# Patient Record
Sex: Female | Born: 1950 | Race: White | Hispanic: No | Marital: Married | Smoking: Former smoker
Health system: Southern US, Community
[De-identification: ages and names within clinical notes are randomized; demographics above are authoritative.]

## PROBLEM LIST (undated history)

## (undated) DIAGNOSIS — I10 Essential (primary) hypertension: Secondary | ICD-10-CM

## (undated) DIAGNOSIS — E785 Hyperlipidemia, unspecified: Secondary | ICD-10-CM

## (undated) DIAGNOSIS — I499 Cardiac arrhythmia, unspecified: Secondary | ICD-10-CM

## (undated) DIAGNOSIS — I219 Acute myocardial infarction, unspecified: Secondary | ICD-10-CM

## (undated) DIAGNOSIS — M1990 Unspecified osteoarthritis, unspecified site: Secondary | ICD-10-CM

## (undated) DIAGNOSIS — F419 Anxiety disorder, unspecified: Secondary | ICD-10-CM

## (undated) DIAGNOSIS — K5900 Constipation, unspecified: Secondary | ICD-10-CM

## (undated) DIAGNOSIS — R51 Headache: Secondary | ICD-10-CM

## (undated) DIAGNOSIS — R519 Headache, unspecified: Secondary | ICD-10-CM

## (undated) DIAGNOSIS — L409 Psoriasis, unspecified: Secondary | ICD-10-CM

## (undated) DIAGNOSIS — F329 Major depressive disorder, single episode, unspecified: Secondary | ICD-10-CM

## (undated) DIAGNOSIS — I2510 Atherosclerotic heart disease of native coronary artery without angina pectoris: Secondary | ICD-10-CM

## (undated) DIAGNOSIS — F32A Depression, unspecified: Secondary | ICD-10-CM

## (undated) HISTORY — PX: WISDOM TOOTH EXTRACTION: SHX21

## (undated) HISTORY — DX: Hyperlipidemia, unspecified: E78.5

## (undated) HISTORY — PX: CORONARY ANGIOPLASTY: SHX604

## (undated) HISTORY — PX: DILATION AND CURETTAGE OF UTERUS: SHX78

## (undated) HISTORY — PX: COLONOSCOPY: SHX174

## (undated) HISTORY — PX: TONSILLECTOMY: SUR1361

## (undated) HISTORY — DX: Constipation, unspecified: K59.00

## (undated) HISTORY — PX: TOOTH EXTRACTION: SHX859

## (undated) HISTORY — DX: Cardiac arrhythmia, unspecified: I49.9

## (undated) HISTORY — PX: CORONARY STENT PLACEMENT: SHX1402

---

## 1983-09-24 HISTORY — PX: NASAL SEPTUM SURGERY: SHX37

## 2000-07-31 ENCOUNTER — Ambulatory Visit (HOSPITAL_COMMUNITY): Admission: RE | Admit: 2000-07-31 | Discharge: 2000-07-31 | Payer: Self-pay | Admitting: Family Medicine

## 2000-07-31 ENCOUNTER — Encounter: Payer: Self-pay | Admitting: Family Medicine

## 2000-08-13 ENCOUNTER — Ambulatory Visit (HOSPITAL_COMMUNITY): Admission: RE | Admit: 2000-08-13 | Discharge: 2000-08-13 | Payer: Self-pay | Admitting: Family Medicine

## 2000-08-13 ENCOUNTER — Encounter: Payer: Self-pay | Admitting: Family Medicine

## 2001-09-29 ENCOUNTER — Other Ambulatory Visit: Admission: RE | Admit: 2001-09-29 | Discharge: 2001-09-29 | Payer: Self-pay | Admitting: Family Medicine

## 2001-12-04 ENCOUNTER — Encounter: Payer: Self-pay | Admitting: Family Medicine

## 2001-12-04 ENCOUNTER — Ambulatory Visit (HOSPITAL_COMMUNITY): Admission: RE | Admit: 2001-12-04 | Discharge: 2001-12-04 | Payer: Self-pay | Admitting: Family Medicine

## 2001-12-18 ENCOUNTER — Encounter: Admission: RE | Admit: 2001-12-18 | Discharge: 2001-12-18 | Payer: Self-pay

## 2003-06-20 ENCOUNTER — Encounter: Admission: RE | Admit: 2003-06-20 | Discharge: 2003-06-20 | Payer: Self-pay | Admitting: Internal Medicine

## 2003-06-20 ENCOUNTER — Encounter: Payer: Self-pay | Admitting: Internal Medicine

## 2003-09-24 HISTORY — PX: APPENDECTOMY: SHX54

## 2004-04-23 ENCOUNTER — Other Ambulatory Visit: Admission: RE | Admit: 2004-04-23 | Discharge: 2004-04-23 | Payer: Self-pay | Admitting: Obstetrics and Gynecology

## 2005-12-04 ENCOUNTER — Encounter: Admission: RE | Admit: 2005-12-04 | Discharge: 2005-12-04 | Payer: Self-pay | Admitting: Obstetrics and Gynecology

## 2006-06-11 ENCOUNTER — Ambulatory Visit: Payer: Self-pay | Admitting: Gastroenterology

## 2006-06-27 ENCOUNTER — Ambulatory Visit: Payer: Self-pay | Admitting: Gastroenterology

## 2006-09-23 HISTORY — PX: SHOULDER ARTHROSCOPY: SHX128

## 2007-03-04 ENCOUNTER — Encounter: Admission: RE | Admit: 2007-03-04 | Discharge: 2007-03-04 | Payer: Self-pay | Admitting: Internal Medicine

## 2007-11-26 ENCOUNTER — Encounter: Admission: RE | Admit: 2007-11-26 | Discharge: 2007-11-26 | Payer: Self-pay | Admitting: Orthopaedic Surgery

## 2009-06-08 ENCOUNTER — Encounter: Admission: RE | Admit: 2009-06-08 | Discharge: 2009-06-08 | Payer: Self-pay | Admitting: Internal Medicine

## 2010-02-22 ENCOUNTER — Encounter: Admission: RE | Admit: 2010-02-22 | Discharge: 2010-02-22 | Payer: Self-pay | Admitting: Internal Medicine

## 2010-04-26 DIAGNOSIS — I10 Essential (primary) hypertension: Secondary | ICD-10-CM

## 2010-04-26 HISTORY — DX: Essential (primary) hypertension: I10

## 2010-05-10 DIAGNOSIS — M48061 Spinal stenosis, lumbar region without neurogenic claudication: Secondary | ICD-10-CM | POA: Insufficient documentation

## 2010-05-10 HISTORY — DX: Spinal stenosis, lumbar region without neurogenic claudication: M48.061

## 2010-10-14 ENCOUNTER — Encounter: Payer: Self-pay | Admitting: Orthopaedic Surgery

## 2012-09-23 DIAGNOSIS — I219 Acute myocardial infarction, unspecified: Secondary | ICD-10-CM | POA: Insufficient documentation

## 2012-09-23 HISTORY — PX: CARDIAC CATHETERIZATION: SHX172

## 2012-09-23 HISTORY — DX: Acute myocardial infarction, unspecified: I21.9

## 2014-09-29 ENCOUNTER — Other Ambulatory Visit: Payer: Self-pay | Admitting: Internal Medicine

## 2014-09-29 DIAGNOSIS — M545 Low back pain: Secondary | ICD-10-CM

## 2014-09-30 ENCOUNTER — Ambulatory Visit
Admission: RE | Admit: 2014-09-30 | Discharge: 2014-09-30 | Disposition: A | Discharge status: Home / Self Care | Payer: BLUE CROSS/BLUE SHIELD | Source: Ambulatory Visit | Attending: Internal Medicine | Admitting: Internal Medicine

## 2014-09-30 DIAGNOSIS — M545 Low back pain: Secondary | ICD-10-CM

## 2014-09-30 MED ORDER — GADOBENATE DIMEGLUMINE 529 MG/ML IV SOLN
14.0000 mL | Freq: Once | INTRAVENOUS | Status: AC | PRN
  Administered 2014-09-30: 14 mL via INTRAVENOUS

## 2014-10-27 DIAGNOSIS — M899 Disorder of bone, unspecified: Secondary | ICD-10-CM

## 2014-10-27 DIAGNOSIS — M4310 Spondylolisthesis, site unspecified: Secondary | ICD-10-CM | POA: Insufficient documentation

## 2014-10-27 HISTORY — DX: Disorder of bone, unspecified: M89.9

## 2014-10-27 HISTORY — DX: Spondylolisthesis, site unspecified: M43.10

## 2015-02-10 ENCOUNTER — Other Ambulatory Visit: Payer: Self-pay | Admitting: Neurosurgery

## 2015-03-01 DIAGNOSIS — I700 Atherosclerosis of aorta: Secondary | ICD-10-CM

## 2015-03-01 DIAGNOSIS — I2510 Atherosclerotic heart disease of native coronary artery without angina pectoris: Secondary | ICD-10-CM | POA: Insufficient documentation

## 2015-03-01 DIAGNOSIS — E785 Hyperlipidemia, unspecified: Secondary | ICD-10-CM | POA: Insufficient documentation

## 2015-03-01 DIAGNOSIS — E7800 Pure hypercholesterolemia, unspecified: Secondary | ICD-10-CM | POA: Insufficient documentation

## 2015-03-01 HISTORY — DX: Atherosclerotic heart disease of native coronary artery without angina pectoris: I25.10

## 2015-03-01 HISTORY — DX: Atherosclerosis of aorta: I70.0

## 2015-03-07 HISTORY — PX: CARDIOVASCULAR STRESS TEST: SHX262

## 2015-03-10 ENCOUNTER — Encounter (HOSPITAL_COMMUNITY)
Admission: RE | Admit: 2015-03-10 | Discharge: 2015-03-10 | Disposition: A | Discharge status: Home / Self Care | Payer: BLUE CROSS/BLUE SHIELD | Source: Ambulatory Visit | Attending: Neurosurgery | Admitting: Neurosurgery

## 2015-03-10 ENCOUNTER — Encounter (HOSPITAL_COMMUNITY): Payer: Self-pay

## 2015-03-10 DIAGNOSIS — Z87891 Personal history of nicotine dependence: Secondary | ICD-10-CM | POA: Diagnosis not present

## 2015-03-10 DIAGNOSIS — I2510 Atherosclerotic heart disease of native coronary artery without angina pectoris: Secondary | ICD-10-CM | POA: Diagnosis not present

## 2015-03-10 DIAGNOSIS — Z01812 Encounter for preprocedural laboratory examination: Secondary | ICD-10-CM | POA: Insufficient documentation

## 2015-03-10 DIAGNOSIS — I252 Old myocardial infarction: Secondary | ICD-10-CM | POA: Diagnosis not present

## 2015-03-10 DIAGNOSIS — Z79899 Other long term (current) drug therapy: Secondary | ICD-10-CM | POA: Insufficient documentation

## 2015-03-10 DIAGNOSIS — I10 Essential (primary) hypertension: Secondary | ICD-10-CM | POA: Diagnosis not present

## 2015-03-10 DIAGNOSIS — Z0183 Encounter for blood typing: Secondary | ICD-10-CM | POA: Insufficient documentation

## 2015-03-10 DIAGNOSIS — Z7982 Long term (current) use of aspirin: Secondary | ICD-10-CM | POA: Insufficient documentation

## 2015-03-10 DIAGNOSIS — Z01818 Encounter for other preprocedural examination: Secondary | ICD-10-CM | POA: Insufficient documentation

## 2015-03-10 HISTORY — DX: Anxiety disorder, unspecified: F41.9

## 2015-03-10 HISTORY — DX: Essential (primary) hypertension: I10

## 2015-03-10 HISTORY — DX: Atherosclerotic heart disease of native coronary artery without angina pectoris: I25.10

## 2015-03-10 HISTORY — DX: Major depressive disorder, single episode, unspecified: F32.9

## 2015-03-10 HISTORY — DX: Acute myocardial infarction, unspecified: I21.9

## 2015-03-10 HISTORY — DX: Depression, unspecified: F32.A

## 2015-03-10 LAB — CBC WITH DIFFERENTIAL/PLATELET
BASOS ABS: 0 10*3/uL (ref 0.0–0.1)
BASOS PCT: 1 % (ref 0–1)
EOS ABS: 0.4 10*3/uL (ref 0.0–0.7)
Eosinophils Relative: 5 % (ref 0–5)
HCT: 40.6 % (ref 36.0–46.0)
Hemoglobin: 14.2 g/dL (ref 12.0–15.0)
LYMPHS ABS: 2.4 10*3/uL (ref 0.7–4.0)
Lymphocytes Relative: 35 % (ref 12–46)
MCH: 31.9 pg (ref 26.0–34.0)
MCHC: 35 g/dL (ref 30.0–36.0)
MCV: 91.2 fL (ref 78.0–100.0)
Monocytes Absolute: 0.5 10*3/uL (ref 0.1–1.0)
Monocytes Relative: 7 % (ref 3–12)
NEUTROS PCT: 52 % (ref 43–77)
Neutro Abs: 3.6 10*3/uL (ref 1.7–7.7)
PLATELETS: 233 10*3/uL (ref 150–400)
RBC: 4.45 MIL/uL (ref 3.87–5.11)
RDW: 12.2 % (ref 11.5–15.5)
WBC: 6.9 10*3/uL (ref 4.0–10.5)

## 2015-03-10 LAB — BASIC METABOLIC PANEL
ANION GAP: 9 (ref 5–15)
BUN: 18 mg/dL (ref 6–20)
CALCIUM: 9.7 mg/dL (ref 8.9–10.3)
CO2: 27 mmol/L (ref 22–32)
CREATININE: 0.84 mg/dL (ref 0.44–1.00)
Chloride: 104 mmol/L (ref 101–111)
GFR calc Af Amer: >60 mL/min (ref >60)
GFR calc non Af Amer: >60 mL/min (ref >60)
GLUCOSE: 94 mg/dL (ref 65–99)
Potassium: 4.2 mmol/L (ref 3.5–5.1)
SODIUM: 140 mmol/L (ref 135–145)

## 2015-03-10 LAB — SURGICAL PCR SCREEN
MRSA, PCR: NEGATIVE
STAPHYLOCOCCUS AUREUS: NEGATIVE

## 2015-03-10 LAB — TYPE AND SCREEN
ABO/RH(D): O POS
ANTIBODY SCREEN: NEGATIVE

## 2015-03-10 LAB — ABO/RH: ABO/RH(D): O POS

## 2015-03-10 NOTE — Progress Notes (Signed)
req'd ekg, stress test (done 03/07/15) [place] [facility] [place] dr Shirlee More [phone]

## 2015-03-10 NOTE — Pre-Procedure Instructions (Signed)
Carol Love  03/10/2015      [id number] - [place], [place] - [address] [facility] [address] Phone: [phone] Fax: [phone]    Your procedure is scheduled on 03/20/15.  Report to [facility] short stay admitting at 600 A.M.  Call this number if you have problems the morning of surgery:  [phone]   Remember:  Do not eat food or drink liquids after midnight.  Take these medicines the morning of surgery with A SIP OF WATER sectral(acebutolol), zoloft, tramadol     STOP all herbel meds, nsaids (aleve,naproxen,advil,ibuprofen) 5 days prior to surgery starting 03/15/15 including all vitamins, aspirin,etodalac   Do not wear jewelry, make-up or nail polish.  Do not wear lotions, powders, or perfumes.  You may wear deodorant.  Do not shave 48 hours prior to surgery.  Men may shave face and neck.  Do not bring valuables to the hospital.  [facility] is not responsible for any belongings or valuables.  Contacts, dentures or bridgework may not be worn into surgery.  Leave your suitcase in the car.  After surgery it may be brought to your room.  For patients admitted to the hospital, discharge time will be determined by your treatment team.  Patients discharged the day of surgery will not be allowed to drive home.   Name and phone number of your driver:    Special instructions:   Special Instructions: [place] - Preparing for Surgery  Before surgery, you can play an important role.  Because skin is not sterile, your skin needs to be as free of germs as possible.  You can reduce the number of germs on you skin by washing with CHG (chlorahexidine gluconate) soap before surgery.  CHG is an antiseptic cleaner which kills germs and bonds with the skin to continue killing germs even after washing.  Please DO NOT use if you have an allergy to CHG or antibacterial soaps.  If your skin becomes reddened/irritated stop using the CHG and inform your nurse  when you arrive at Short Stay.  Do not shave (including legs and underarms) for at least 48 hours prior to the first CHG shower.  You may shave your face.  Please follow these instructions carefully:   1.  Shower with CHG Soap the night before surgery and the morning of Surgery.  2.  If you choose to wash your hair, wash your hair first as usual with your normal shampoo.  3.  After you shampoo, rinse your hair and body thoroughly to remove the Shampoo.  4.  Use CHG as you would any other liquid soap.  You can apply chg directly  to the skin and wash gently with scrungie or a clean washcloth.  5.  Apply the CHG Soap to your body ONLY FROM THE NECK DOWN.  Do not use on open wounds or open sores.  Avoid contact with your eyes ears, mouth and genitals (private parts).  Wash genitals (private parts)       with your normal soap.  6.  Wash thoroughly, paying special attention to the area where your surgery will be performed.  7.  Thoroughly rinse your body with warm water from the neck down.  8.  DO NOT shower/wash with your normal soap after using and rinsing off the CHG Soap.  9.  Pat yourself dry with a clean towel.            10.  Wear  clean pajamas.            11.  Place clean sheets on your bed the night of your first shower and do not sleep with pets.  Day of Surgery  Do not apply any lotions/deodorants the morning of surgery.  Please wear clean clothes to the hospital/surgery center.  Please read over the following fact sheets that you were given. Pain Booklet, Coughing and Deep Breathing, Blood Transfusion Information, MRSA Information and Surgical Site Infection Prevention

## 2015-03-10 NOTE — Progress Notes (Addendum)
Anesthesia Chart Review:  Pt is 64 year old female scheduled for maximum access PLIF on 03/20/2015 with Dr. Annette Stable.   Cardiologist is Dr. Bettina Gavia, last office visit with his partner, Dr. Agustin Cree on 03/01/2015.   PMH includes: MI (2014), CAD (stent), HTN. Former smoker. BMI 30  Medications include: acebutolol, ASA, lipitor  Preoperative labs reviewed.    Nuclear stress test 03/07/2015: -negative myocardial perfusion stress test  Pt has cardiac clearance for procedure from Dr. Agustin Cree.  There is no EKG on file at [facility]. Will need to obtain DOS.   If EKG acceptable, I anticipate pt can proceed as scheduled.   Willeen Cass, FNP-BC [facility] Short Stay Surgical Center/Anesthesiology Phone: [phone] 03/13/2015 3:13 PM

## 2015-03-20 ENCOUNTER — Inpatient Hospital Stay (HOSPITAL_COMMUNITY): Payer: BLUE CROSS/BLUE SHIELD | Admitting: Emergency Medicine

## 2015-03-20 ENCOUNTER — Encounter (HOSPITAL_COMMUNITY): Payer: Self-pay | Admitting: Certified Registered Nurse Anesthetist

## 2015-03-20 ENCOUNTER — Inpatient Hospital Stay (HOSPITAL_COMMUNITY): Payer: BLUE CROSS/BLUE SHIELD

## 2015-03-20 ENCOUNTER — Inpatient Hospital Stay (HOSPITAL_COMMUNITY): Payer: BLUE CROSS/BLUE SHIELD | Admitting: Certified Registered Nurse Anesthetist

## 2015-03-20 ENCOUNTER — Inpatient Hospital Stay (HOSPITAL_COMMUNITY)
Admission: RE | Admit: 2015-03-20 | Discharge: 2015-03-22 | DRG: 460 | Disposition: A | Discharge status: Home / Self Care | Payer: BLUE CROSS/BLUE SHIELD | Source: Ambulatory Visit | Attending: Neurosurgery | Admitting: Neurosurgery

## 2015-03-20 ENCOUNTER — Encounter (HOSPITAL_COMMUNITY)
Admission: RE | Disposition: A | Discharge status: Home / Self Care | Payer: Self-pay | Source: Ambulatory Visit | Attending: Neurosurgery

## 2015-03-20 DIAGNOSIS — F419 Anxiety disorder, unspecified: Secondary | ICD-10-CM | POA: Diagnosis present

## 2015-03-20 DIAGNOSIS — Z882 Allergy status to sulfonamides status: Secondary | ICD-10-CM | POA: Diagnosis not present

## 2015-03-20 DIAGNOSIS — I10 Essential (primary) hypertension: Secondary | ICD-10-CM | POA: Diagnosis present

## 2015-03-20 DIAGNOSIS — I252 Old myocardial infarction: Secondary | ICD-10-CM | POA: Diagnosis not present

## 2015-03-20 DIAGNOSIS — M4310 Spondylolisthesis, site unspecified: Secondary | ICD-10-CM

## 2015-03-20 DIAGNOSIS — M4806 Spinal stenosis, lumbar region: Secondary | ICD-10-CM | POA: Diagnosis present

## 2015-03-20 DIAGNOSIS — Z880 Allergy status to penicillin: Secondary | ICD-10-CM | POA: Diagnosis not present

## 2015-03-20 DIAGNOSIS — Z7982 Long term (current) use of aspirin: Secondary | ICD-10-CM

## 2015-03-20 DIAGNOSIS — Z791 Long term (current) use of non-steroidal anti-inflammatories (NSAID): Secondary | ICD-10-CM | POA: Diagnosis not present

## 2015-03-20 DIAGNOSIS — Z87891 Personal history of nicotine dependence: Secondary | ICD-10-CM | POA: Diagnosis not present

## 2015-03-20 DIAGNOSIS — Z955 Presence of coronary angioplasty implant and graft: Secondary | ICD-10-CM | POA: Diagnosis not present

## 2015-03-20 DIAGNOSIS — M4326 Fusion of spine, lumbar region: Secondary | ICD-10-CM

## 2015-03-20 DIAGNOSIS — F329 Major depressive disorder, single episode, unspecified: Secondary | ICD-10-CM | POA: Diagnosis present

## 2015-03-20 DIAGNOSIS — M4316 Spondylolisthesis, lumbar region: Principal | ICD-10-CM | POA: Diagnosis present

## 2015-03-20 DIAGNOSIS — Z886 Allergy status to analgesic agent status: Secondary | ICD-10-CM | POA: Diagnosis not present

## 2015-03-20 DIAGNOSIS — I2510 Atherosclerotic heart disease of native coronary artery without angina pectoris: Secondary | ICD-10-CM | POA: Diagnosis present

## 2015-03-20 HISTORY — PX: MAXIMUM ACCESS (MAS)POSTERIOR LUMBAR INTERBODY FUSION (PLIF) 1 LEVEL: SHX6368

## 2015-03-20 HISTORY — DX: Spondylolisthesis, site unspecified: M43.10

## 2015-03-20 SURGERY — FOR MAXIMUM ACCESS (MAS) POSTERIOR LUMBAR INTERBODY FUSION (PLIF) 1 LEVEL
Anesthesia: General | Site: Back

## 2015-03-20 MED ORDER — ONDANSETRON HCL 4 MG/2ML IJ SOLN
INTRAMUSCULAR | Status: DC | PRN
  Administered 2015-03-20: 4 mg via INTRAVENOUS

## 2015-03-20 MED ORDER — DEXAMETHASONE SODIUM PHOSPHATE 4 MG/ML IJ SOLN
INTRAMUSCULAR | Status: DC | PRN
  Administered 2015-03-20: 4 mg via INTRAVENOUS

## 2015-03-20 MED ORDER — PHENOL 1.4 % MT LIQD: 1.0000 | OROMUCOSAL | Status: DC | PRN

## 2015-03-20 MED ORDER — ROCURONIUM BROMIDE 50 MG/5ML IV SOLN
INTRAVENOUS | Status: AC
  Filled 2015-03-20: qty 1

## 2015-03-20 MED ORDER — OXYCODONE-ACETAMINOPHEN 5-325 MG PO TABS
1.0000 | ORAL_TABLET | ORAL | Status: DC | PRN
  Administered 2015-03-20 – 2015-03-22 (×10): 2 via ORAL
  Filled 2015-03-20 (×10): qty 2

## 2015-03-20 MED ORDER — SUCCINYLCHOLINE CHLORIDE 20 MG/ML IJ SOLN
INTRAMUSCULAR | Status: DC | PRN
  Administered 2015-03-20: 50 mg via INTRAVENOUS

## 2015-03-20 MED ORDER — ASPIRIN 81 MG PO CHEW
162.0000 mg | CHEWABLE_TABLET | Freq: Every evening | ORAL | Status: DC
  Administered 2015-03-20 – 2015-03-21 (×2): 162 mg via ORAL
  Filled 2015-03-20 (×4): qty 2

## 2015-03-20 MED ORDER — BACITRACIN 50000 UNITS IM SOLR
INTRAMUSCULAR | Status: DC | PRN
  Administered 2015-03-20: 500 mL

## 2015-03-20 MED ORDER — DIAZEPAM 5 MG PO TABS
5.0000 mg | ORAL_TABLET | Freq: Four times a day (QID) | ORAL | Status: DC | PRN
  Administered 2015-03-20 – 2015-03-21 (×3): 5 mg via ORAL
  Administered 2015-03-21 – 2015-03-22 (×2): 10 mg via ORAL
  Filled 2015-03-20: qty 1
  Filled 2015-03-20: qty 2
  Filled 2015-03-20: qty 1
  Filled 2015-03-20: qty 2
  Filled 2015-03-20: qty 1

## 2015-03-20 MED ORDER — OXYCODONE HCL 5 MG PO TABS
ORAL_TABLET | ORAL | Status: AC
  Filled 2015-03-20: qty 1

## 2015-03-20 MED ORDER — LACTATED RINGERS IV SOLN
INTRAVENOUS | Status: DC
  Administered 2015-03-20 (×2): via INTRAVENOUS

## 2015-03-20 MED ORDER — TRAMADOL HCL 50 MG PO TABS: 50.0000 mg | ORAL_TABLET | Freq: Two times a day (BID) | ORAL | Status: DC | PRN

## 2015-03-20 MED ORDER — DEXAMETHASONE SODIUM PHOSPHATE 10 MG/ML IJ SOLN
10.0000 mg | INTRAMUSCULAR | Status: DC
  Filled 2015-03-20: qty 1

## 2015-03-20 MED ORDER — VANCOMYCIN HCL 1000 MG IV SOLR: 1000.0000 mg | INTRAVENOUS | Status: DC | PRN

## 2015-03-20 MED ORDER — SODIUM CHLORIDE 0.9 % IJ SOLN
INTRAMUSCULAR | Status: AC
  Filled 2015-03-20: qty 10

## 2015-03-20 MED ORDER — DEXAMETHASONE SODIUM PHOSPHATE 4 MG/ML IJ SOLN
INTRAMUSCULAR | Status: AC
  Filled 2015-03-20: qty 1

## 2015-03-20 MED ORDER — MIDAZOLAM HCL 5 MG/5ML IJ SOLN
INTRAMUSCULAR | Status: DC | PRN
  Administered 2015-03-20: 2 mg via INTRAVENOUS

## 2015-03-20 MED ORDER — PROPOFOL INFUSION 10 MG/ML OPTIME
INTRAVENOUS | Status: DC | PRN
  Administered 2015-03-20: 100 ug/kg/min via INTRAVENOUS

## 2015-03-20 MED ORDER — HYDROCODONE-ACETAMINOPHEN 5-325 MG PO TABS: 1.0000 | ORAL_TABLET | ORAL | Status: DC | PRN

## 2015-03-20 MED ORDER — HYDROMORPHONE HCL 1 MG/ML IJ SOLN
INTRAMUSCULAR | Status: AC
  Filled 2015-03-20: qty 1

## 2015-03-20 MED ORDER — LIDOCAINE HCL (CARDIAC) 20 MG/ML IV SOLN
INTRAVENOUS | Status: AC
  Filled 2015-03-20: qty 5

## 2015-03-20 MED ORDER — LIDOCAINE HCL (CARDIAC) 20 MG/ML IV SOLN
INTRAVENOUS | Status: DC | PRN
  Administered 2015-03-20: 60 mg via INTRAVENOUS

## 2015-03-20 MED ORDER — OXYCODONE HCL 5 MG PO TABS
5.0000 mg | ORAL_TABLET | Freq: Once | ORAL | Status: AC | PRN
  Administered 2015-03-20: 5 mg via ORAL

## 2015-03-20 MED ORDER — VANCOMYCIN HCL 1000 MG IV SOLR
INTRAVENOUS | Status: AC
  Filled 2015-03-20: qty 1000

## 2015-03-20 MED ORDER — THROMBIN 20000 UNITS EX SOLR
CUTANEOUS | Status: DC | PRN
  Administered 2015-03-20: 20 mL via TOPICAL

## 2015-03-20 MED ORDER — ACETAMINOPHEN 325 MG PO TABS: 650.0000 mg | ORAL_TABLET | ORAL | Status: DC | PRN

## 2015-03-20 MED ORDER — HYDROMORPHONE HCL 1 MG/ML IJ SOLN: 0.5000 mg | INTRAMUSCULAR | Status: DC | PRN

## 2015-03-20 MED ORDER — ACETAMINOPHEN 325 MG PO TABS: 325.0000 mg | ORAL_TABLET | ORAL | Status: DC | PRN

## 2015-03-20 MED ORDER — ATORVASTATIN CALCIUM 40 MG PO TABS
40.0000 mg | ORAL_TABLET | Freq: Every day | ORAL | Status: DC
  Administered 2015-03-20 – 2015-03-21 (×2): 40 mg via ORAL
  Filled 2015-03-20 (×3): qty 1

## 2015-03-20 MED ORDER — MIDAZOLAM HCL 2 MG/2ML IJ SOLN
INTRAMUSCULAR | Status: AC
  Filled 2015-03-20: qty 2

## 2015-03-20 MED ORDER — FENTANYL CITRATE (PF) 250 MCG/5ML IJ SOLN
INTRAMUSCULAR | Status: AC
  Filled 2015-03-20: qty 5

## 2015-03-20 MED ORDER — SODIUM CHLORIDE 0.9 % IJ SOLN: 3.0000 mL | INTRAMUSCULAR | Status: DC | PRN

## 2015-03-20 MED ORDER — AMITRIPTYLINE HCL 50 MG PO TABS
50.0000 mg | ORAL_TABLET | Freq: Every day | ORAL | Status: DC
  Administered 2015-03-20 – 2015-03-21 (×2): 50 mg via ORAL
  Filled 2015-03-20 (×3): qty 1

## 2015-03-20 MED ORDER — SODIUM CHLORIDE 0.9 % IJ SOLN
3.0000 mL | Freq: Two times a day (BID) | INTRAMUSCULAR | Status: DC
  Administered 2015-03-20: 3 mL via INTRAVENOUS

## 2015-03-20 MED ORDER — MENTHOL 3 MG MT LOZG: 1.0000 | LOZENGE | OROMUCOSAL | Status: DC | PRN

## 2015-03-20 MED ORDER — VANCOMYCIN HCL 1000 MG IV SOLR
INTRAVENOUS | Status: DC | PRN
  Administered 2015-03-20: 1000 mg

## 2015-03-20 MED ORDER — ALPRAZOLAM 0.5 MG PO TABS: 1.0000 mg | ORAL_TABLET | Freq: Every evening | ORAL | Status: DC

## 2015-03-20 MED ORDER — ONDANSETRON HCL 4 MG/2ML IJ SOLN
INTRAMUSCULAR | Status: AC
  Filled 2015-03-20: qty 4

## 2015-03-20 MED ORDER — SERTRALINE HCL 100 MG PO TABS
100.0000 mg | ORAL_TABLET | Freq: Every day | ORAL | Status: DC
Start: 2015-03-21 — End: 2015-03-22
  Administered 2015-03-21: 100 mg via ORAL
  Filled 2015-03-20 (×2): qty 1

## 2015-03-20 MED ORDER — 0.9 % SODIUM CHLORIDE (POUR BTL) OPTIME
TOPICAL | Status: DC | PRN
  Administered 2015-03-20: 1000 mL

## 2015-03-20 MED ORDER — HYDROMORPHONE HCL 1 MG/ML IJ SOLN
0.2500 mg | INTRAMUSCULAR | Status: DC | PRN
  Administered 2015-03-20 (×2): 0.5 mg via INTRAVENOUS

## 2015-03-20 MED ORDER — VANCOMYCIN HCL IN DEXTROSE 1-5 GM/200ML-% IV SOLN
1000.0000 mg | Freq: Once | INTRAVENOUS | Status: AC
  Administered 2015-03-20: 1000 mg via INTRAVENOUS
  Filled 2015-03-20: qty 200

## 2015-03-20 MED ORDER — VANCOMYCIN HCL IN DEXTROSE 1-5 GM/200ML-% IV SOLN
INTRAVENOUS | Status: AC
  Administered 2015-03-20: 1000 mg via INTRAVENOUS
  Filled 2015-03-20: qty 200

## 2015-03-20 MED ORDER — ACETAMINOPHEN 160 MG/5ML PO SOLN
325.0000 mg | ORAL | Status: DC | PRN
  Filled 2015-03-20: qty 20.3

## 2015-03-20 MED ORDER — ETODOLAC ER 500 MG PO TB24
500.0000 mg | ORAL_TABLET | Freq: Every day | ORAL | Status: DC
Start: 2015-03-20 — End: 2015-03-22
  Filled 2015-03-20 (×3): qty 1

## 2015-03-20 MED ORDER — PROPOFOL 10 MG/ML IV BOLUS
INTRAVENOUS | Status: AC
  Filled 2015-03-20: qty 20

## 2015-03-20 MED ORDER — CEFAZOLIN SODIUM-DEXTROSE 2-3 GM-% IV SOLR: 2.0000 g | INTRAVENOUS | Status: DC

## 2015-03-20 MED ORDER — PROPOFOL 10 MG/ML IV BOLUS
INTRAVENOUS | Status: DC | PRN
  Administered 2015-03-20: 40 mg via INTRAVENOUS
  Administered 2015-03-20: 30 mg via INTRAVENOUS
  Administered 2015-03-20: 160 mg via INTRAVENOUS

## 2015-03-20 MED ORDER — FENTANYL CITRATE (PF) 100 MCG/2ML IJ SOLN
INTRAMUSCULAR | Status: DC | PRN
  Administered 2015-03-20: 50 ug via INTRAVENOUS
  Administered 2015-03-20: 150 ug via INTRAVENOUS

## 2015-03-20 MED ORDER — PHENYLEPHRINE 40 MCG/ML (10ML) SYRINGE FOR IV PUSH (FOR BLOOD PRESSURE SUPPORT)
PREFILLED_SYRINGE | INTRAVENOUS | Status: AC
  Filled 2015-03-20: qty 10

## 2015-03-20 MED ORDER — ACETAMINOPHEN 650 MG RE SUPP: 650.0000 mg | RECTAL | Status: DC | PRN

## 2015-03-20 MED ORDER — OXYCODONE HCL 5 MG/5ML PO SOLN: 5.0000 mg | Freq: Once | ORAL | Status: AC | PRN

## 2015-03-20 MED ORDER — EPHEDRINE SULFATE 50 MG/ML IJ SOLN
INTRAMUSCULAR | Status: AC
  Filled 2015-03-20: qty 1

## 2015-03-20 MED ORDER — ONDANSETRON HCL 4 MG/2ML IJ SOLN: 4.0000 mg | INTRAMUSCULAR | Status: DC | PRN

## 2015-03-20 MED ORDER — ACEBUTOLOL HCL 200 MG PO CAPS
200.0000 mg | ORAL_CAPSULE | Freq: Every day | ORAL | Status: DC
  Administered 2015-03-21: 200 mg via ORAL
  Filled 2015-03-20 (×2): qty 1

## 2015-03-20 MED ORDER — SODIUM CHLORIDE 0.9 % IV SOLN: 250.0000 mL | INTRAVENOUS | Status: DC

## 2015-03-20 MED ORDER — DEXTROSE 5 % IV SOLN
10.0000 mg | INTRAVENOUS | Status: DC | PRN
  Administered 2015-03-20: 10 ug/min via INTRAVENOUS

## 2015-03-20 SURGICAL SUPPLY — 64 items
BAG DECANTER FOR FLEXI CONT (MISCELLANEOUS) ×2 IMPLANT
BENZOIN TINCTURE PRP APPL 2/3 (GAUZE/BANDAGES/DRESSINGS) ×2 IMPLANT
BIT DRILL PLIF MAS 5.0MM DISP (DRILL) ×1 IMPLANT
BLADE CLIPPER SURG (BLADE) IMPLANT
BRUSH SCRUB EZ PLAIN DRY (MISCELLANEOUS) ×2 IMPLANT
BUR CUTTER 7.0 ROUND (BURR) ×2 IMPLANT
BUR MATCHSTICK NEURO 3.0 LAGG (BURR) ×2 IMPLANT
CAGE COROENT MP 11X23X9 (Cage) ×4 IMPLANT
CLIP NEUROVISION LG (CLIP) ×2 IMPLANT
CONT SPEC 4OZ CLIKSEAL STRL BL (MISCELLANEOUS) ×4 IMPLANT
COVER BACK TABLE 24X17X13 BIG (DRAPES) IMPLANT
COVER BACK TABLE 60X90IN (DRAPES) ×2 IMPLANT
DRAPE C-ARM 42X72 X-RAY (DRAPES) ×2 IMPLANT
DRAPE C-ARMOR (DRAPES) ×2 IMPLANT
DRAPE LAPAROTOMY 100X72X124 (DRAPES) ×2 IMPLANT
DRAPE POUCH INSTRU U-SHP 10X18 (DRAPES) ×2 IMPLANT
DRAPE SURG 17X23 STRL (DRAPES) ×8 IMPLANT
DRILL PLIF MAS 5.0MM DISP (DRILL) ×2
DRSG OPSITE POSTOP 4X6 (GAUZE/BANDAGES/DRESSINGS) ×2 IMPLANT
DURAPREP 26ML APPLICATOR (WOUND CARE) ×2 IMPLANT
ELECT BLADE 4.0 EZ CLEAN MEGAD (MISCELLANEOUS)
ELECT REM PT RETURN 9FT ADLT (ELECTROSURGICAL) ×2
ELECTRODE BLDE 4.0 EZ CLN MEGD (MISCELLANEOUS) IMPLANT
ELECTRODE REM PT RTRN 9FT ADLT (ELECTROSURGICAL) ×1 IMPLANT
EVACUATOR 1/8 PVC DRAIN (DRAIN) IMPLANT
GAUZE SPONGE 4X4 12PLY STRL (GAUZE/BANDAGES/DRESSINGS) ×2 IMPLANT
GAUZE SPONGE 4X4 16PLY XRAY LF (GAUZE/BANDAGES/DRESSINGS) IMPLANT
GLOVE ECLIPSE 9.0 STRL (GLOVE) ×4 IMPLANT
GLOVE EXAM NITRILE LRG STRL (GLOVE) IMPLANT
GLOVE EXAM NITRILE MD LF STRL (GLOVE) IMPLANT
GLOVE EXAM NITRILE XL STR (GLOVE) IMPLANT
GLOVE EXAM NITRILE XS STR PU (GLOVE) IMPLANT
GOWN STRL REUS W/ TWL LRG LVL3 (GOWN DISPOSABLE) IMPLANT
GOWN STRL REUS W/ TWL XL LVL3 (GOWN DISPOSABLE) ×2 IMPLANT
GOWN STRL REUS W/TWL 2XL LVL3 (GOWN DISPOSABLE) IMPLANT
GOWN STRL REUS W/TWL LRG LVL3 (GOWN DISPOSABLE)
GOWN STRL REUS W/TWL XL LVL3 (GOWN DISPOSABLE) ×2
KIT BASIN OR (CUSTOM PROCEDURE TRAY) ×2 IMPLANT
KIT NEEDLE NVM5 EMG ELECT (KITS) ×1 IMPLANT
KIT NEEDLE NVM5 EMG ELECTRODE (KITS) ×1
KIT ROOM TURNOVER OR (KITS) ×2 IMPLANT
LIQUID BAND (GAUZE/BANDAGES/DRESSINGS) ×2 IMPLANT
NEEDLE HYPO 22GX1.5 SAFETY (NEEDLE) ×2 IMPLANT
NS IRRIG 1000ML POUR BTL (IV SOLUTION) ×2 IMPLANT
PACK LAMINECTOMY NEURO (CUSTOM PROCEDURE TRAY) ×2 IMPLANT
ROD 5.5X40MM (Rod) ×4 IMPLANT
SCREW LOCK (Screw) ×4 IMPLANT
SCREW LOCK FXNS SPNE MAS PL (Screw) ×4 IMPLANT
SCREW SHANK 5.0X35 (Screw) ×4 IMPLANT
SCREW TULIP 5.5 (Screw) ×4 IMPLANT
SPONGE LAP 4X18 X RAY DECT (DISPOSABLE) IMPLANT
SPONGE SURGIFOAM ABS GEL SZ50 (HEMOSTASIS) IMPLANT
STRIP CLOSURE SKIN 1/2X4 (GAUZE/BANDAGES/DRESSINGS) ×2 IMPLANT
SUT VIC AB 0 CT1 18XCR BRD8 (SUTURE) ×2 IMPLANT
SUT VIC AB 0 CT1 8-18 (SUTURE) ×2
SUT VIC AB 2-0 CT1 18 (SUTURE) ×2 IMPLANT
SUT VIC AB 3-0 SH 8-18 (SUTURE) ×2 IMPLANT
SYR 20ML ECCENTRIC (SYRINGE) ×2 IMPLANT
TAPE STRIPS DRAPE STRL (GAUZE/BANDAGES/DRESSINGS) ×2 IMPLANT
TOWEL OR 17X24 6PK STRL BLUE (TOWEL DISPOSABLE) ×2 IMPLANT
TOWEL OR 17X26 10 PK STRL BLUE (TOWEL DISPOSABLE) ×2 IMPLANT
TRAP SPECIMEN MUCOUS 40CC (MISCELLANEOUS) ×2 IMPLANT
TRAY FOLEY W/METER SILVER 14FR (SET/KITS/TRAYS/PACK) IMPLANT
WATER STERILE IRR 1000ML POUR (IV SOLUTION) ×2 IMPLANT

## 2015-03-20 NOTE — Anesthesia Preprocedure Evaluation (Addendum)
Anesthesia Evaluation  Patient identified by MRN, date of birth, ID band Patient awake    Reviewed: Allergy & Precautions, NPO status , Patient's Chart, lab work & pertinent test results, reviewed documented beta blocker date and time   Airway Mallampati: II  TM Distance: >3 FB Neck ROM: Full    Dental  (+) Teeth Intact   Pulmonary neg shortness of breath, neg sleep apnea, neg COPDneg recent URI, former smoker,  breath sounds clear to auscultation        Cardiovascular hypertension, Pt. on medications and Pt. on home beta blockers + CAD, + Past MI and + Cardiac Stents Rhythm:Regular     Neuro/Psych PSYCHIATRIC DISORDERS Anxiety Depression negative neurological ROS     GI/Hepatic negative GI ROS, Neg liver ROS,   Endo/Other  negative endocrine ROS  Renal/GU negative Renal ROS     Musculoskeletal negative musculoskeletal ROS (+)   Abdominal   Peds  Hematology negative hematology ROS (+)   Anesthesia Other Findings   Reproductive/Obstetrics                           Anesthesia Physical Anesthesia Plan  ASA: II  Anesthesia Plan: General   Post-op Pain Management:    Induction: Intravenous  Airway Management Planned: Oral ETT  Additional Equipment: None  Intra-op Plan:   Post-operative Plan: Extubation in OR  Informed Consent: I have reviewed the patients History and Physical, chart, labs and discussed the procedure including the risks, benefits and alternatives for the proposed anesthesia with the patient or authorized representative who has indicated his/her understanding and acceptance.   Dental advisory given  Plan Discussed with: CRNA and Surgeon  Anesthesia Plan Comments:         Anesthesia Quick Evaluation

## 2015-03-20 NOTE — Op Note (Signed)
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  L4-5 posterior lumbar interbody fusion utilizing interbody peek cages and locally harvested autograft.  Posterior lateral arthrodesis utilizing nonsegmental cortical pedicle screw fixation and local autograft.  Surgeon:Kimaya Whitlatch A.Adeli Frost, M.D.  Asst. Surgeon: Ellene Route  Anesthesia: General  Indication: 64 year old female with progressive back and lower extremity pain right greater than left. Workup demonstrates evidence of an unstable grade 1 L4-5 degenerative spondylolisthesis with stenosis. Patient presents now for decompression and fusion in hopes of improving her symptoms.  Operative note: After induction of anesthesia, patient position prone onto Wilson frame and appropriately padded. Lumbar region prepped and draped. Incision made overlying L4-5. Dissection performed. Retractor placed. Fluoroscopy used. Levels confirmed. Pilot holes drilled into the pars interarticularis under fluoroscopic guidance. Pilot holes were then extended using a hand drill under fluoroscopic guidance drilling in a cephalad and lateral direction into the pedicle of L4. This is accomplished bilaterally and at each level was done using intraoperative neural monitoring. The pilot hole was probed and found to be solidly within the bone. Each pilot hole was then tapped with a screw tap and then 5.0 x 35 mm cortical pedicle screws from new NU[employer] were placed. AP and lateral fluoroscopy confirmed good position of the screws. Bilateral decompressive laminotomies then performed using high-speed drill and Kerrison rongeurs to remove the inferior aspect  lamina of L4 medial L4-5 facet joint including the entire inferior articular process of L4 and the majority the superior articular process of L5 bilaterally. The superior aspect of the L5 lamina were also removed. Ligament flavum elevated and resected. Wide decompressive foraminotomies in excess of what would be required for interbody fusion was performed along the course the exiting L4 and L5 nerve roots bilaterally. Bilateral discectomies were then performed. Disc space was scraped and prepared for interbody fusion. Interbody cage size was found to be 11 x 4 by 23 mm. The cages were then packed with morselized autograft. With a distractor on the patient's right side and the thecal sac and nerve roots protected on the left side a cage was impacted into position and rotated into final place. Distractor was removed from the contralateral side. Once again the endplates were prepared. Morselize autograft was now packed into the interspace. A second cage was then impacted in position and rotated and final place. Fluoroscopy confirmed good positioning of the cages. Pedicles of L5 were identified using surface landmarks and intraoperative fluoroscopy. Superficial bone overlying the pedicle was then removed using high-speed drill. Each pedicles and drilled with a hand drill under neural monitoring. 5.0 x 30 mm screws were placed bilaterally at L5. Short segment titanium rods and placed over the screws at L4 and L5. Locking caps and placed or the screws. Locking caps and engaged the construct under compression. A small amount of residual bone was packed posterior laterally for later fusion area and final images revealed good position of the bone graft and hardware at the proper operative level with normal alignment of the spine. Wound is then irrigated one final time. Gelfoam was placed topically over the laminotomy defects. Vancomycin powder was placed the deep wound space. Wounds and close in layers. Steri-Strips and  sterile dressing were applied. There were no apparent complications. Patient tolerated the procedure  well and she returns to the recovery room postop.

## 2015-03-20 NOTE — H&P (Signed)
  Carol Love is an 64 y.o. female.   Chief Complaint: Back and right leg pain HPI: 64 year old female with progressive back and bilateral lower extremity pain right greater than left. Workup demonstrates evidence of marked L4-5 facet arthropathy with stenosis. Plain x-rays demonstrate evidence of dynamic anterolisthesis with flexion. Patient has failed conservative management. She presents now for L4-5 decompression and fusion.  Past Medical History  Diagnosis Date  . Myocardial infarction 2014    stent  . Coronary artery disease   . Hypertension   . Anxiety   . Depression     Past Surgical History  Procedure Laterality Date  . Appendectomy  05  . Shoulder arthroscopy Left 08  . Cardiac catheterization  14    stent  . Cardiovascular stress test  03/07/15    History reviewed. No pertinent family history. Social History:  reports that she quit smoking about 2 years ago. Her smoking use included Cigarettes. She has a 22.5 pack-year smoking history. She does not have any smokeless tobacco history on file. She reports that she drinks about 3.0 oz of alcohol per week. She reports that she does not use illicit drugs.  Allergies:  Allergies  Allergen Reactions  . Ibuprofen Itching and Swelling  . Penicillins Rash  . Sulfa Antibiotics Rash    Medications Prior to Admission  Medication Sig Dispense Refill  . acebutolol (SECTRAL) 200 MG capsule Take 200 mg by mouth daily.  6  . ALPRAZolam (XANAX) 0.5 MG tablet Take 1 mg by mouth every evening.  5  . amitriptyline (ELAVIL) 50 MG tablet Take 50 mg by mouth at bedtime.  12  . aspirin 81 MG chewable tablet Chew 162 mg by mouth every evening.    Marland Kitchen atorvastatin (LIPITOR) 80 MG tablet Take 40 mg by mouth daily.  6  . etodolac (LODINE XL) 500 MG 24 hr tablet Take 500 mg by mouth daily.  13  . sertraline (ZOLOFT) 100 MG tablet Take 100 mg by mouth daily.  12  . traMADol (ULTRAM) 50 MG tablet Take 50 mg by mouth every 12 (twelve) hours as  needed for moderate pain.    . Biotin 1 MG CAPS Take by mouth 1 day or 1 dose.    . calcium citrate-vitamin D (CITRACAL+D) 315-200 MG-UNIT per tablet Take 1 tablet by mouth 1 day or 1 dose.    . Multiple Vitamin (MULTIVITAMIN) capsule Take 1 capsule by mouth daily.      No results found for this or any previous visit (from the past 48 hour(s)). No results found.    Blood pressure 109/68, pulse 67, temperature 98.1 F (36.7 C), temperature source Oral, resp. rate 20, height 5' (1.524 m), weight 70.308 kg (155 lb), SpO2 95 %.  Patient is awake and alert. She is oriented and appropriate. Motor and sensory function of her extremities is normal. Reflexes are normal. Straight leg raising equivocal bilaterally. Examination HEENT throat is unremarkable her chest and abdomen are benign. Extremities are free from injury or deformity. Assessment/Plan L4-5 grade 1 degenerative unstable spondylolisthesis with stenosis. Plan L4-5 bilateral decompressive laminotomies and foraminotomies followed by posterior lumbar interbody fusion utilizing interbody peek cages, local care facility, and supplemented by cortical pedicle screw fixation. Risks and benefits of that explained. Patient wishes to proceed.  Janna Oak A 03/20/2015, 7:47 AM

## 2015-03-20 NOTE — Brief Op Note (Signed)
03/20/2015  9:58 AM  PATIENT:  Carol Love  64 y.o. female  PRE-OPERATIVE DIAGNOSIS:  Spondylolisthesis  POST-OPERATIVE DIAGNOSIS:  Spondylolisthesis  PROCEDURE:  Procedure(s) with comments: FOR MAXIMUM ACCESS (MAS) POSTERIOR LUMBAR INTERBODY FUSION (PLIF) 1 LEVEL (N/A) - FOR MAXIMUM ACCESS (MAS) POSTERIOR LUMBAR INTERBODY FUSION (PLIF) 1 LEVEL LUMBAR FOUR-FIVE  SURGEON:  Surgeon(s) and Role:    * Earnie Larsson, MD - Primary    * Kristeen Miss, MD - Assisting  PHYSICIAN ASSISTANT:   ASSISTANTS:    ANESTHESIA:   general  EBL:  Total I/O In: 1400 [I.V.:1400] Out: 425 [Urine:125; Blood:300]  BLOOD ADMINISTERED:none  DRAINS: none   LOCAL MEDICATIONS USED:  MARCAINE     SPECIMEN:  No Specimen  DISPOSITION OF SPECIMEN:  N/A  COUNTS:  YES  TOURNIQUET:  * No tourniquets in log *  DICTATION: .Dragon Dictation  PLAN OF CARE: Admit to inpatient   PATIENT DISPOSITION:  PACU - hemodynamically stable.   Delay start of Pharmacological VTE agent (>24hrs) due to surgical blood loss or risk of bleeding: yes

## 2015-03-20 NOTE — Evaluation (Signed)
Occupational Therapy Evaluation Patient Details Name: CYAN CLIPPINGER MRN: [id number] DOB: [birth year] Today's Date: 03/20/2015    History of Present Illness 64 y.o. s/p FOR MAXIMUM ACCESS (MAS) POSTERIOR LUMBAR INTERBODY FUSION (PLIF) 1 LEVEL LUMBAR FOUR-FIVE   Clinical Impression   Pt s/p above. Pt independent with ADLs, PTA. Feel pt will benefit from acute OT to increase independence prior to d/c.    Follow Up Recommendations  No OT follow up;Supervision - Intermittent    Equipment Recommendations  Other (comment) (AE)    Recommendations for Other Services       Precautions / Restrictions Precautions Precautions: Back Precaution Booklet Issued: Yes (comment) Precaution Comments: educated on back precautions Required Braces or Orthoses: Spinal Brace Spinal Brace: Lumbar corset;Applied in sitting position (adjusted in standing) Restrictions Weight Bearing Restrictions: No      Mobility Bed Mobility Overal bed mobility: Needs Assistance Bed Mobility: Rolling;Sidelying to Sit;Sit to Sidelying Rolling: Supervision Sidelying to sit: Supervision     Sit to sidelying: Supervision General bed mobility comments: verbal and tactile cues given for technique.  Transfers Overall transfer level: Needs assistance   Transfers: Sit to/from Stand Sit to Stand: Min guard         General transfer comment: educated on technique.    Balance  Balance not formally assessed. Min guard for ambulation for safety.                                          ADL Overall ADL's : Needs assistance/impaired                 Upper Body Dressing : Min guard;Sitting;Standing   Lower Body Dressing: Moderate assistance;Sit to/from stand   Toilet Transfer: Min guard;Ambulation (bed)           Functional mobility during ADLs: Min guard General ADL Comments: Educated on back brace-OT had difficulty adjusting brace (had nurse look at it at end of session).  Discussed incorporating precautions into functional activities. Explained AE is available for LB ADLs and explained price/where she can purchase. Educated on LB ADL technique-pt able to cross one leg and could not fully get other leg on knee. Discussed positioning of pillows and recommended not sitting in chair longer than 45 min-1 hour at a time.     Vision     Perception     Praxis      Pertinent Vitals/Pain Pain Assessment: 0-10 Pain Score: 9  Pain Descriptors / Indicators: Constant;Aching Pain Intervention(s): Monitored during session;Repositioned     Hand Dominance Right   Extremity/Trunk Assessment Upper Extremity Assessment Upper Extremity Assessment: Overall WFL for tasks assessed   Lower Extremity Assessment Lower Extremity Assessment: Defer to PT evaluation       Communication Communication Communication: No difficulties   Cognition Arousal/Alertness: Awake/alert Behavior During Therapy: WFL for tasks assessed/performed Overall Cognitive Status: Within Functional Limits for tasks assessed                     General Comments       Exercises       Shoulder Instructions      Home Living Family/patient expects to be discharged to:: Private residence Living Arrangements: Spouse/significant other Available Help at Discharge: Family;Available 24 hours/day (spouse can't do a lot of physical assist) Type of Home: House Home Access: Stairs to enter [employer] of Steps: 2 Entrance  Stairs-Rails: None Home Layout: One level     Bathroom Shower/Tub: Occupational psychologist: Handicapped height     Home Equipment: Civil engineer, contracting - built in          Prior Functioning/Environment Level of Independence: Independent             OT Diagnosis: Acute pain   OT Problem List: Decreased knowledge of use of DME or AE;Decreased range of motion;Decreased knowledge of precautions;Pain;Decreased activity tolerance   OT  Treatment/Interventions: Self-care/ADL training;Therapeutic activities;Patient/family education;Balance training;DME and/or AE instruction    OT Goals(Current goals can be found in the care plan section) Acute Rehab OT Goals Patient Stated Goal: not stated OT Goal Formulation: With patient Time For Goal Achievement: 03/27/15 Potential to Achieve Goals: Good ADL Goals Pt Will Perform Grooming: with set-up;standing Pt Will Perform Lower Body Dressing: with set-up;with adaptive equipment;sit to/from stand Pt Will Transfer to Toilet: with modified independence;ambulating (elevated toilet) Pt Will Perform Toileting - Clothing Manipulation and hygiene: with modified independence;sit to/from stand Additional ADL Goal #1: Pt will independently verbalize 3/3 back precautions and maintain during session.  OT Frequency: Min 2X/week   Barriers to D/C:            Co-evaluation              End of Session Equipment Utilized During Treatment: Gait belt;Back brace Nurse Communication: Other (comment) (pain; no followup/DME needs at this time)  Activity Tolerance: Patient tolerated treatment well Patient left: in bed;with call bell/phone within reach;with nursing/sitter in room   Time: 1610-9604 OT Time Calculation (min): 19 min Charges:  OT General Charges $OT Visit: 1 Procedure OT Evaluation $Initial OT Evaluation Tier I: 1 Procedure G-CodesBenito Mccreedy OTR/L [id number] 03/20/2015, 5:38 PM

## 2015-03-20 NOTE — Anesthesia Procedure Notes (Signed)
Procedure Name: Intubation Date/Time: 03/20/2015 8:21 AM Performed by: Merdis Delay Pre-anesthesia Checklist: Patient identified, Timeout performed, Emergency Drugs available, Suction available and Patient being monitored Patient Re-evaluated:Patient Re-evaluated prior to inductionOxygen Delivery Method: Circle system utilized Preoxygenation: Pre-oxygenation with 100% oxygen Intubation Type: IV induction Laryngoscope Size: Mac and 3 Grade View: Grade II Tube type: Oral Tube size: 7.0 mm Number of attempts: 1 Airway Equipment and Method: Stylet Placement Confirmation: ETT inserted through vocal cords under direct vision,  breath sounds checked- equal and bilateral,  positive ETCO2 and CO2 detector Secured at: 22 cm Tube secured with: Tape Dental Injury: Teeth and Oropharynx as per pre-operative assessment

## 2015-03-20 NOTE — Care Management (Signed)
Utilization review completed by Shalene Gallen N. Tarig Zimmers, RN BSN 

## 2015-03-20 NOTE — Transfer of Care (Signed)
Immediate Anesthesia Transfer of Care Note  Patient: Carol Love  Procedure(s) Performed: Procedure(s) with comments: FOR MAXIMUM ACCESS (MAS) POSTERIOR LUMBAR INTERBODY FUSION (PLIF) 1 LEVEL (N/A) - FOR MAXIMUM ACCESS (MAS) POSTERIOR LUMBAR INTERBODY FUSION (PLIF) 1 LEVEL LUMBAR FOUR-FIVE  Patient Location: PACU  Anesthesia Type:General  Level of Consciousness: awake, alert  and oriented  Airway & Oxygen Therapy: Patient Spontanous Breathing and Patient connected to nasal cannula oxygen  Post-op Assessment: Report given to RN and Post -op Vital signs reviewed and stable  Post vital signs: Reviewed and stable  Last Vitals:  Filed Vitals:   03/20/15 0622  BP: 109/68  Pulse: 67  Temp: 36.7 C  Resp: 20    Complications: No apparent anesthesia complications

## 2015-03-20 NOTE — Anesthesia Postprocedure Evaluation (Signed)
  Anesthesia Post-op Note  Patient: Carol Love  Procedure(s) Performed: Procedure(s) with comments: FOR MAXIMUM ACCESS (MAS) POSTERIOR LUMBAR INTERBODY FUSION (PLIF) 1 LEVEL (N/A) - FOR MAXIMUM ACCESS (MAS) POSTERIOR LUMBAR INTERBODY FUSION (PLIF) 1 LEVEL LUMBAR FOUR-FIVE  Patient Location: PACU  Anesthesia Type:General  Level of Consciousness: awake  Airway and Oxygen Therapy: Patient Spontanous Breathing  Post-op Pain: mild  Post-op Assessment: Post-op Vital signs reviewed, Patient's Cardiovascular Status Stable, Respiratory Function Stable, Patent Airway, No signs of Nausea or vomiting and Pain level controlled   LLE Sensation: Full sensation   RLE Sensation: Full sensation      Post-op Vital Signs: Reviewed and stable  Last Vitals:  Filed Vitals:   03/20/15 1134  BP: 115/69  Pulse: 75  Temp: 36.7 C  Resp: 16    Complications: No apparent anesthesia complications

## 2015-03-20 NOTE — Progress Notes (Signed)
Orthopedic Tech Progress Note Patient Details:  Carol Love 01-28-1951 [id number] Spoke with nursng on 3C, patient has brace. Np action needed from Ortho at this time.  Patient ID: Carol Love, female   DOB: 1951-03-17, 64 y.o.   MRN: [id number]   Fenton Foy 03/20/2015, 12:57 PM

## 2015-03-21 ENCOUNTER — Encounter (HOSPITAL_COMMUNITY): Payer: Self-pay | Admitting: Neurosurgery

## 2015-03-21 MED ORDER — ONDANSETRON HCL 4 MG PO TABS
4.0000 mg | ORAL_TABLET | Freq: Three times a day (TID) | ORAL | Status: DC | PRN
  Filled 2015-03-21: qty 1

## 2015-03-21 NOTE — Progress Notes (Signed)
Postop day 1. Patient with moderate back pain and some crampiness into her posterior left leg. No true radicular pain. No symptoms of sensory loss or motor weakness.  Awake and alert. Oriented and appropriate. Motor and sensory function intact. Wound clean and dry.  Progressing recently well following lumbar decompression and fusion. Patient thinks that she needs 1 more hospital day for discharge.

## 2015-03-21 NOTE — Evaluation (Signed)
Physical Therapy Evaluation Patient Details Name: Carol Love MRN: [id number] DOB: 23-Apr-1951 Today's Date: 03/21/2015   History of Present Illness  64 y.o. s/p FOR MAXIMUM ACCESS (MAS) POSTERIOR LUMBAR INTERBODY FUSION (PLIF) 1 LEVEL LUMBAR FOUR-FIVE  Clinical Impression  Pt admitted with above diagnosis. Pt currently with functional limitations due to the deficits listed below (see PT Problem List). At the time of PT eval pt was able to perform transfers and ambulation with min guard assist for safety. Pt overall moving very slow, and recommending use of RW for support and energy conservation. Pt will benefit from skilled PT to increase their independence and safety with mobility to allow discharge to the venue listed below.       Follow Up Recommendations Home health PT;Supervision for mobility/OOB    Equipment Recommendations  Rolling walker with 5" wheels    Recommendations for Other Services       Precautions / Restrictions Precautions Precautions: Back;Fall Precaution Booklet Issued: Yes (comment) Precaution Comments: Pt could recall 3/3 back precautions Required Braces or Orthoses: Spinal Brace Spinal Brace: Lumbar corset;Applied in sitting position (adjusted in standing) Restrictions Weight Bearing Restrictions: No      Mobility  Bed Mobility Overal bed mobility: Needs Assistance Bed Mobility: Rolling;Sidelying to Sit Rolling: Supervision Sidelying to sit: Min guard       General bed mobility comments: Verbal and tactile cues given for technique.  Transfers Overall transfer level: Needs assistance Equipment used: None Transfers: Sit to/from Stand Sit to Stand: Min guard         General transfer comment: VC's to maintain back precautions  Ambulation/Gait Ambulation/Gait assistance: Min guard;Supervision Ambulation Distance (Feet): 200 Feet Assistive device: Rolling walker (2 wheeled);None Gait Pattern/deviations: Step-through pattern;Decreased  stride length;Trendelenburg Gait velocity: Decreased Gait velocity interpretation: Below normal speed for age/gender General Gait Details: Initially not using an AD and pt frequently reaching for railings in the hall for support. Generally moving very slow. Pt was given the RW at end of gait training, and although provided support, pt was not able to ambulate any quicker. Min guard to supervision required.   Stairs Stairs: Yes Stairs assistance: Min guard Stair Management: One rail Right Number of Stairs: 2 General stair comments: VC's for sequencing and technique.   Wheelchair Mobility    Modified Rankin (Stroke Patients Only)       Balance Overall balance assessment: Needs assistance Sitting-balance support: Feet supported;No upper extremity supported Sitting balance-Leahy Scale: Good     Standing balance support: No upper extremity supported Standing balance-Leahy Scale: Fair                               Pertinent Vitals/Pain Pain Assessment: 0-10 Pain Score: 8  Pain Location: Incisional pain, L hip pain from cramping Pain Descriptors / Indicators: Cramping;Operative site guarding Pain Intervention(s): Limited activity within patient's tolerance;Monitored during session;Repositioned    Home Living Family/patient expects to be discharged to:: Private residence Living Arrangements: Spouse/significant other Available Help at Discharge: Family;Available 24 hours/day (spouse can't do a lot of physical assist) Type of Home: House Home Access: Stairs to enter Entrance Stairs-Rails: None Entrance Stairs-Number of Steps: 2 Home Layout: One level Home Equipment: Shower seat - built in      Prior Function Level of Independence: Independent               Hand Dominance   Dominant Hand: Right    Extremity/Trunk Assessment  Upper Extremity Assessment: Overall WFL for tasks assessed           Lower Extremity Assessment: RLE deficits/detail;LLE  deficits/detail RLE Deficits / Details: Pt reports RLE is weaker from symptoms prior to surgery LLE Deficits / Details: Pt reports L hip has been cramping since surgery and now feels more painful than the right.   Cervical / Trunk Assessment: Normal  Communication   Communication: No difficulties  Cognition Arousal/Alertness: Awake/alert Behavior During Therapy: WFL for tasks assessed/performed Overall Cognitive Status: Within Functional Limits for tasks assessed                      General Comments      Exercises        Assessment/Plan    PT Assessment Patient needs continued PT services  PT Diagnosis Difficulty walking;Generalized weakness   PT Problem List Decreased strength;Decreased range of motion;Decreased activity tolerance;Decreased balance;Decreased mobility;Decreased knowledge of use of DME;Decreased safety awareness;Decreased knowledge of precautions;Pain  PT Treatment Interventions Gait training;DME instruction;Stair training;Functional mobility training;Therapeutic activities;Therapeutic exercise;Neuromuscular re-education;Patient/family education   PT Goals (Current goals can be found in the Care Plan section) Acute Rehab PT Goals Patient Stated Goal: To not bust her stitches and maintain back precautions PT Goal Formulation: With patient Time For Goal Achievement: 03/28/15 Potential to Achieve Goals: Good    Frequency Min 5X/week   Barriers to discharge        Co-evaluation               End of Session Equipment Utilized During Treatment: Back brace Activity Tolerance: Patient limited by fatigue Patient left: in chair;with call bell/phone within reach Nurse Communication: Mobility status         Time: 1448-1856 PT Time Calculation (min) (ACUTE ONLY): 33 min   Charges:   PT Evaluation $Initial PT Evaluation Tier I: 1 Procedure PT Treatments $Gait Training: 8-22 mins   PT G Codes:        Rolinda Roan 03/24/2015, 9:05  AM  Rolinda Roan, PT, DPT Acute Rehabilitation Services Pager: [phone]

## 2015-03-21 NOTE — Progress Notes (Signed)
Occupational Therapy Treatment Patient Details Name: Carol Love MRN: [id number] DOB: 1951-05-17 Today's Date: 03/21/2015    History of present illness 64 y.o. s/p FOR MAXIMUM ACCESS (MAS) POSTERIOR LUMBAR INTERBODY FUSION (PLIF) 1 LEVEL LUMBAR FOUR-FIVE   OT comments  Pt progressing toward goals. Education provided in session. Plan to check with pt tomorrow, if she is still here, to see if she has any other OT questions/concerns.  Follow Up Recommendations  No OT follow up;Supervision - Intermittent    Equipment Recommendations  Other (comment) (AE)    Recommendations for Other Services      Precautions / Restrictions Precautions Precautions: Back;Fall Precaution Booklet Issued:  (given to pt yesterday) Precaution Comments: Reviewed precautions. Pt able to state 3/3 Required Braces or Orthoses: Spinal Brace Spinal Brace:  (already on in session; adjusted in standing) Restrictions Weight Bearing Restrictions: No       Mobility Bed Mobility Overal bed mobility: Needs Assistance Bed Mobility: Sit to Sidelying;Sidelying to Sit Sidelying to sit: Mod assist;HOB elevated     Sit to sidelying: HOB elevated;Min assist General bed mobility comments: assist with LE when getting into bed. Cues for technique for bed mobility. Assist with trunk to come to sitting position.  Transfers Overall transfer level: Needs assistance Equipment used: Rolling walker (2 wheeled) Transfers: Sit to/from Stand Sit to Stand: Supervision         General transfer comment: cues for hand placement.     Balance No LOB in session. Used RW for pivotal steps.                ADL Overall ADL's : Needs assistance/impaired                 Upper Body Dressing : Supervision/safety;Standing (adjsuted back brace)   Lower Body Dressing: Sit to/from stand;With adaptive equipment;Moderate assistance Lower Body Dressing Details (indicate cue type and reason): pt doffed socks/OT donned  socks-pt didn't want to practice with sockaid Toilet Transfer: Supervision/safety;Stand-pivot;RW (bed <> chair; pivotal steps)           Functional mobility during ADLs: Supervision/safety (pivotal steps) General ADL Comments: Discussed incorporating precautions into functional activities. Reviewed some information from yesterday's session (back brace, ADLs, back brace). Discussed shower transfer. Educated on safety such as safe footwear, use of bag on walker, rugs/items on floor, and sitting for most of LB ADLs. Educated on AE and discussed toilet aide/what pt could use for toilet aide (suggested wipes). Recommended spouse be with her for shower transfer. Reviewed positioning of pillows.      Vision                     Perception     Praxis      Cognition  Awake/Alert Behavior During Therapy: WFL for tasks assessed/performed Overall Cognitive Status: Within Functional Limits for tasks assessed                       Extremity/Trunk Assessment  Upper Extremity Assessment Upper Extremity Assessment: Overall WFL for tasks assessed   Lower Extremity Assessment-defer to PT evaluation Lower Extremity Assessment: RLE deficits/detail;LLE deficits/detail RLE Deficits / Details: Pt reports RLE is weaker from symptoms prior to surgery LLE Deficits / Details: Pt reports L hip has been cramping since surgery and now feels more painful than the right.    Cervical / Trunk Assessment Cervical / Trunk Assessment: Normal    Exercises     Shoulder Instructions  General Comments      Pertinent Vitals/ Pain       Pain Assessment: 0-10 Pain Score: 8  Pain Location: back, LLE (hip) Pain Descriptors / Indicators: Sore;Aching;Throbbing Pain Intervention(s): Repositioned;Limited activity within patient's tolerance;Monitored during session;Other (comment) (notified nurse for pain meds)  Home Living Family/patient expects to be discharged to:: Private residence Living  Arrangements: Spouse/significant other Available Help at Discharge: Family;Available 24 hours/day (spouse can't do a lot of physical assist) Type of Home: House Home Access: Stairs to enter [employer] of Steps: 2 Entrance Stairs-Rails: None Home Layout: One level               Home Equipment: Shower seat - built in          Prior Functioning/Environment Level of Independence: Independent            Frequency Min 2X/week     Progress Toward Goals  OT Goals(current goals can now be found in the care plan section)  Progress towards OT goals: Progressing toward goals  Acute Rehab OT Goals Patient Stated Goal: not stated OT Goal Formulation: With patient Time For Goal Achievement: 03/27/15 Potential to Achieve Goals: Good ADL Goals Pt Will Perform Grooming: with set-up;standing Pt Will Perform Lower Body Dressing: with set-up;with adaptive equipment;sit to/from stand Pt Will Transfer to Toilet: with modified independence;ambulating (elevated toilet) Pt Will Perform Toileting - Clothing Manipulation and hygiene: with modified independence;sit to/from stand Additional ADL Goal #1: Pt will independently verbalize 3/3 back precautions and maintain during session.  Plan Discharge plan remains appropriate    Co-evaluation                 End of Session Equipment Utilized During Treatment: Rolling walker;Back brace   Activity Tolerance Patient limited by pain   Patient Left in chair;with call bell/phone within reach   Nurse Communication Mobility status;Other (comment) (no OT equipment needs; asked about pain meds)        Time: 3704-8889 OT Time Calculation (min): 20 min  Charges: OT General Charges $OT Visit: 1 Procedure OT Treatments $Self Care/Home Management : 8-22 mins  Benito Mccreedy OTR/L [phone] 03/21/2015, 9:31 AM

## 2015-03-22 MED ORDER — DIAZEPAM 5 MG PO TABS: 5.0000 mg | ORAL_TABLET | Freq: Four times a day (QID) | ORAL | Status: DC | PRN

## 2015-03-22 MED ORDER — OXYCODONE-ACETAMINOPHEN 5-325 MG PO TABS: 1.0000 | ORAL_TABLET | ORAL | Status: DC | PRN

## 2015-03-22 NOTE — Discharge Summary (Signed)
Physician Discharge Summary  Patient ID: KYNZI LEVAY MRN: [id number] DOB/AGE: 1950-11-29 64 y.o.  Admit date: 03/20/2015 Discharge date: 03/22/2015  Admission Diagnoses:  Discharge Diagnoses:  Principal Problem:   Degenerative spondylolisthesis   Discharged Condition: good  Hospital Course: Patient admitted to the hospital where she underwent an uncomplicated P3-2 decompression infusion. Postoperative she is progressing reasonably well. Preoperative back and lower extremity pain much improved. Strength sensation intact. Ambulating without difficulty. Ready for discharge home.  Consults:   Significant Diagnostic Studies:   Treatments:   Discharge Exam: Blood pressure 112/69, pulse 82, temperature 98.4 F (36.9 C), temperature source Oral, resp. rate 18, height 5' (1.524 m), weight 70.308 kg (155 lb), SpO2 96 %. Awake and alert. Oriented and appropriate. Motor and sensory function intact. Wound clean and dry. Chest and abdomen benign.  Disposition: Final discharge disposition not confirmed     Medication List    TAKE these medications        acebutolol 200 MG capsule  Commonly known as:  SECTRAL  Take 200 mg by mouth daily.     ALPRAZolam 0.5 MG tablet  Commonly known as:  XANAX  Take 1 mg by mouth every evening.     amitriptyline 50 MG tablet  Commonly known as:  ELAVIL  Take 50 mg by mouth at bedtime.     aspirin 81 MG chewable tablet  Chew 162 mg by mouth every evening.     atorvastatin 80 MG tablet  Commonly known as:  LIPITOR  Take 40 mg by mouth daily.     Biotin 1 MG Caps  Take by mouth 1 day or 1 dose.     calcium citrate-vitamin D 315-200 MG-UNIT per tablet  Commonly known as:  CITRACAL+D  Take 1 tablet by mouth 1 day or 1 dose.     diazepam 5 MG tablet  Commonly known as:  VALIUM  Take 1-2 tablets (5-10 mg total) by mouth every 6 (six) hours as needed for muscle spasms.     etodolac 500 MG 24 hr tablet  Commonly known as:  LODINE XL   Take 500 mg by mouth daily.     multivitamin capsule  Take 1 capsule by mouth daily.     oxyCODONE-acetaminophen 5-325 MG per tablet  Commonly known as:  PERCOCET/ROXICET  Take 1-2 tablets by mouth every 4 (four) hours as needed for moderate pain.     sertraline 100 MG tablet  Commonly known as:  ZOLOFT  Take 100 mg by mouth daily.     traMADol 50 MG tablet  Commonly known as:  ULTRAM  Take 50 mg by mouth every 12 (twelve) hours as needed for moderate pain.           Follow-up Information    Follow up with Charlie Pitter, MD.   Specialty:  Neurosurgery   Contact information:   [address] [place] [address] [phone]       Signed: Charlie Pitter 03/22/2015, 8:36 AM

## 2015-03-22 NOTE — Progress Notes (Signed)
OT Cancellation Note  Patient Details Name: Carol Love MRN: [id number] DOB: 02-08-1951   Cancelled Treatment:    Reason Eval/Treat Not Completed: Other (comment). Pt reports no further concerns for OT, so OT signing off.  Benito Mccreedy OTR/L [phone]   03/22/2015, 9:55 AM

## 2015-03-22 NOTE — Progress Notes (Signed)
PT Cancellation Note  Patient Details Name: Carol Love MRN: [id number] DOB: Jul 26, 1951   Cancelled Treatment:    Reason Eval/Treat Not Completed: Pt reports she is very fatigued this morning and continues to have pain from LE cramping. Per staff, pt is ambulating fairly well in the halls with RW for support. Pt states that she took a long walk this morning and does not wish to get out of bed at this time despite encouragement from therapist. Last session, pt demonstrated the ability to negotiate the stairs, and feel that she is safe to enter her home and get settled if d/c'd today. Encouraged continued ambulation with staff in halls. Will continue to follow.    Rolinda Roan 03/22/2015, 8:23 AM   Rolinda Roan, PT, DPT Acute Rehabilitation Services Pager: [phone]

## 2015-03-22 NOTE — Discharge Instructions (Signed)
Wound Care °Keep incision covered and dry for two days.  If you shower, cover incision with plastic wrap.  °Do not put any creams, lotions, or ointments on incision. °Leave steri-strips on back.  They will fall off by themselves. °Activity °Walk each and every day, increasing distance each day. °No lifting greater than 5 lbs.  Avoid excessive neck motion. °No driving for 2 weeks; may ride as a passenger locally. °If provided with back brace, wear when out of bed.  It is not necessary to wear brace in bed. °Diet °Resume your normal diet.  °Return to Work °Will be discussed at you follow up appointment. °Call Your Doctor If Any of These Occur °Redness, drainage, or swelling at the wound.  °Temperature greater than 101 degrees. °Severe pain not relieved by pain medication. °Incision starts to come apart. °Follow Up Appt °Call today for appointment in 1-2 weeks ([phone]) or for problems.  If you have any hardware placed in your spine, you will need an x-ray before your appointment. ° ° °Spinal Fusion °Spinal fusion is a procedure to make 2 or more of the bones in your spinal column (vertebrae) grow together (fuse). This procedure stops movement between the vertebrae and can relieve pain and prevent deformity.  °Spinal fusion is used to treat the following conditions: °· Fractures of the spine. °· Herniated disk (the spongy material [cartilage] between the vertebrae). °· Abnormal curvatures of the spine, such as scoliosis or kyphosis. °· A weak or an unstable spine, caused by infections or tumor. °RISKS AND COMPLICATIONS °Complications associated with spinal fusion are rare, but they can occur. Possible complications include: °· Bleeding. °· Infection near the incision. °· Nerve damage. Signs of nerve damage are back pain, pain in one or both legs, weakness, or numbness. °· Spinal fluid leakage. °· Blood clot in your leg, which can move to your lungs. °· Difficulty controlling urination or bowel movements. °BEFORE THE  PROCEDURE °· A medical evaluation will be done. This will include a physical exam, blood tests, and imaging exams. °· You will talk with an anesthesiologist. This is the person who will be in charge of the anesthesia during the procedure. Spinal fusion usually requires that you are asleep during the procedure (general anesthesia). °· You will need to stop taking certain medicines, particularly those associated with an increased risk of bleeding. Ask your caregiver about changing or stopping your regular medicines. °· If you smoke, you will need to stop at least 2 weeks before the procedure. Smoking can slow down the healing process, especially fusion of the vertebrae, and increase the risk of complications. °· Do not eat or drink anything for at least 8 hours before the procedure. °PROCEDURE  °A cut (incision) is made over the vertebrae that will be fused. The back muscles are separated from the vertebrae. If you are having this procedure to treat a herniated disk, the disc material pressing on the nerve root is removed (decompression). The area where the disk is removed is then filled with extra bone. Bone from another part of your body (autogenous bone) or bone from a bone donor (allograft bone) may be used. The extra bone promotes fusion between the vertebrae. Sometimes, specific medicines are added to the fusion area to promote bone healing. In most cases, screws and rods or metal plates will be used to attach the vertebrae to stabilize them while they fuse.  °AFTER THE PROCEDURE  °· You will stay in a recovery area until the anesthesia has worn   off. Your blood pressure and pulse will be checked frequently. °· You will be given antibiotics to prevent infection. °· You may continue to receive fluids through an intravenous (IV) tube while you are still in the hospital. °· Pain after surgery is normal. You will be given pain medicine. °· You will be taught how to move correctly and how to stand and walk. While in  bed, you will be instructed to turn frequently, using a "log rolling" technique, in which the entire body is moved without twisting the back. °Document Released: 06/08/2003 Document Revised: 12/02/2011 Document Reviewed: 11/22/2010 °[employer]® Patient Information ©2015 [employer]. This information is not intended to replace advice given to you by your health care provider. Make sure you discuss any questions you have with your health care provider. ° °

## 2015-03-22 NOTE — Progress Notes (Signed)
Pt and husband given D/C instructions with Rx's, verbal understanding was provided. Pt's incision is clean and dry with no sign of infection. Pt's IV was removed prior to D/C. Pt D/C'd home via wheelchair @ 1055 per MD order. Pt is stable @ D/C and has no other needs at this time. Holli Humbles, RN

## 2015-12-05 DIAGNOSIS — Z6829 Body mass index (BMI) 29.0-29.9, adult: Secondary | ICD-10-CM | POA: Diagnosis not present

## 2015-12-05 DIAGNOSIS — M4316 Spondylolisthesis, lumbar region: Secondary | ICD-10-CM | POA: Diagnosis not present

## 2015-12-19 DIAGNOSIS — M533 Sacrococcygeal disorders, not elsewhere classified: Secondary | ICD-10-CM | POA: Diagnosis not present

## 2015-12-19 HISTORY — DX: Sacrococcygeal disorders, not elsewhere classified: M53.3

## 2015-12-26 DIAGNOSIS — I10 Essential (primary) hypertension: Secondary | ICD-10-CM | POA: Diagnosis not present

## 2015-12-26 DIAGNOSIS — I2510 Atherosclerotic heart disease of native coronary artery without angina pectoris: Secondary | ICD-10-CM | POA: Diagnosis not present

## 2015-12-27 DIAGNOSIS — M533 Sacrococcygeal disorders, not elsewhere classified: Secondary | ICD-10-CM | POA: Diagnosis not present

## 2015-12-27 DIAGNOSIS — M4316 Spondylolisthesis, lumbar region: Secondary | ICD-10-CM | POA: Diagnosis not present

## 2016-02-08 DIAGNOSIS — Z1231 Encounter for screening mammogram for malignant neoplasm of breast: Secondary | ICD-10-CM | POA: Diagnosis not present

## 2016-02-08 DIAGNOSIS — Z01419 Encounter for gynecological examination (general) (routine) without abnormal findings: Secondary | ICD-10-CM | POA: Diagnosis not present

## 2016-02-08 DIAGNOSIS — Z124 Encounter for screening for malignant neoplasm of cervix: Secondary | ICD-10-CM | POA: Diagnosis not present

## 2016-02-08 DIAGNOSIS — I219 Acute myocardial infarction, unspecified: Secondary | ICD-10-CM

## 2016-04-29 ENCOUNTER — Encounter: Payer: Self-pay | Admitting: Gastroenterology

## 2016-05-23 DIAGNOSIS — I2510 Atherosclerotic heart disease of native coronary artery without angina pectoris: Secondary | ICD-10-CM | POA: Diagnosis not present

## 2016-05-23 DIAGNOSIS — I10 Essential (primary) hypertension: Secondary | ICD-10-CM | POA: Diagnosis not present

## 2016-05-23 DIAGNOSIS — E7800 Pure hypercholesterolemia, unspecified: Secondary | ICD-10-CM | POA: Diagnosis not present

## 2016-05-23 DIAGNOSIS — Z6831 Body mass index (BMI) 31.0-31.9, adult: Secondary | ICD-10-CM | POA: Diagnosis not present

## 2016-06-07 DIAGNOSIS — I2510 Atherosclerotic heart disease of native coronary artery without angina pectoris: Secondary | ICD-10-CM | POA: Diagnosis not present

## 2016-06-07 DIAGNOSIS — I10 Essential (primary) hypertension: Secondary | ICD-10-CM | POA: Diagnosis not present

## 2016-06-07 DIAGNOSIS — E7800 Pure hypercholesterolemia, unspecified: Secondary | ICD-10-CM | POA: Diagnosis not present

## 2016-07-17 DIAGNOSIS — Z23 Encounter for immunization: Secondary | ICD-10-CM | POA: Diagnosis not present

## 2016-07-17 DIAGNOSIS — I2510 Atherosclerotic heart disease of native coronary artery without angina pectoris: Secondary | ICD-10-CM | POA: Diagnosis not present

## 2016-07-17 DIAGNOSIS — M1990 Unspecified osteoarthritis, unspecified site: Secondary | ICD-10-CM | POA: Diagnosis not present

## 2016-07-17 DIAGNOSIS — Z0000 Encounter for general adult medical examination without abnormal findings: Secondary | ICD-10-CM | POA: Diagnosis not present

## 2016-07-17 DIAGNOSIS — I10 Essential (primary) hypertension: Secondary | ICD-10-CM | POA: Diagnosis not present

## 2016-08-21 DIAGNOSIS — M4316 Spondylolisthesis, lumbar region: Secondary | ICD-10-CM | POA: Diagnosis not present

## 2016-08-21 DIAGNOSIS — Z6829 Body mass index (BMI) 29.0-29.9, adult: Secondary | ICD-10-CM | POA: Diagnosis not present

## 2016-08-21 DIAGNOSIS — I10 Essential (primary) hypertension: Secondary | ICD-10-CM | POA: Diagnosis not present

## 2016-08-23 DIAGNOSIS — M48061 Spinal stenosis, lumbar region without neurogenic claudication: Secondary | ICD-10-CM | POA: Diagnosis not present

## 2016-08-23 DIAGNOSIS — M4316 Spondylolisthesis, lumbar region: Secondary | ICD-10-CM | POA: Diagnosis not present

## 2016-08-27 DIAGNOSIS — M4316 Spondylolisthesis, lumbar region: Secondary | ICD-10-CM | POA: Diagnosis not present

## 2016-09-09 DIAGNOSIS — Z23 Encounter for immunization: Secondary | ICD-10-CM | POA: Diagnosis not present

## 2016-10-30 ENCOUNTER — Encounter: Payer: Self-pay | Admitting: Gastroenterology

## 2016-11-18 DIAGNOSIS — F419 Anxiety disorder, unspecified: Secondary | ICD-10-CM | POA: Diagnosis not present

## 2016-11-18 DIAGNOSIS — I10 Essential (primary) hypertension: Secondary | ICD-10-CM | POA: Diagnosis not present

## 2016-11-18 DIAGNOSIS — I2510 Atherosclerotic heart disease of native coronary artery without angina pectoris: Secondary | ICD-10-CM | POA: Diagnosis not present

## 2016-11-20 DIAGNOSIS — E7800 Pure hypercholesterolemia, unspecified: Secondary | ICD-10-CM | POA: Diagnosis not present

## 2016-11-20 DIAGNOSIS — I2510 Atherosclerotic heart disease of native coronary artery without angina pectoris: Secondary | ICD-10-CM | POA: Diagnosis not present

## 2016-11-20 DIAGNOSIS — I10 Essential (primary) hypertension: Secondary | ICD-10-CM | POA: Diagnosis not present

## 2016-11-20 DIAGNOSIS — Z6831 Body mass index (BMI) 31.0-31.9, adult: Secondary | ICD-10-CM | POA: Diagnosis not present

## 2016-12-18 ENCOUNTER — Encounter: Payer: Self-pay | Admitting: Gastroenterology

## 2016-12-18 ENCOUNTER — Ambulatory Visit (AMBULATORY_SURGERY_CENTER): Payer: Self-pay | Admitting: *Deleted

## 2016-12-18 VITALS — Ht 60.0 in | Wt 152.0 lb

## 2016-12-18 DIAGNOSIS — Z1211 Encounter for screening for malignant neoplasm of colon: Secondary | ICD-10-CM

## 2016-12-18 MED ORDER — NA SULFATE-K SULFATE-MG SULF 17.5-3.13-1.6 GM/177ML PO SOLN: 1.0000 | Freq: Once | ORAL | 0 refills | Status: AC

## 2016-12-18 NOTE — Progress Notes (Signed)
No egg or soy allergy known to patient  No issues with past sedation with any surgeries  or procedures, no intubation problems  No diet pills per patient No home 02 use per patient  No blood thinners per patient  Pt states issues with constipation and she uses the stool softener with stimulant- if she uses this she has no issues with stools being hard  No A fib or A flutter

## 2016-12-31 DIAGNOSIS — H5203 Hypermetropia, bilateral: Secondary | ICD-10-CM | POA: Diagnosis not present

## 2017-01-01 ENCOUNTER — Encounter: Payer: Self-pay | Admitting: Gastroenterology

## 2017-01-01 ENCOUNTER — Ambulatory Visit (AMBULATORY_SURGERY_CENTER): Payer: Medicare Other | Admitting: Gastroenterology

## 2017-01-01 VITALS — BP 96/69 | HR 61 | Temp 97.8°F | Resp 24 | Ht 60.0 in | Wt 152.0 lb

## 2017-01-01 DIAGNOSIS — K635 Polyp of colon: Secondary | ICD-10-CM

## 2017-01-01 DIAGNOSIS — D127 Benign neoplasm of rectosigmoid junction: Secondary | ICD-10-CM | POA: Diagnosis not present

## 2017-01-01 DIAGNOSIS — Z1212 Encounter for screening for malignant neoplasm of rectum: Secondary | ICD-10-CM

## 2017-01-01 DIAGNOSIS — Z1211 Encounter for screening for malignant neoplasm of colon: Secondary | ICD-10-CM | POA: Diagnosis present

## 2017-01-01 MED ORDER — SODIUM CHLORIDE 0.9 % IV SOLN: 500.0000 mL | INTRAVENOUS | Status: DC

## 2017-01-01 NOTE — Patient Instructions (Signed)
Discharge instructions given. Handouts on polyps,diverticulosis and hemorrhoids. Resume previous medications. YOU HAD AN ENDOSCOPIC PROCEDURE TODAY AT THE [facility]:   Refer to the procedure report that was given to you for any specific questions about what was found during the examination.  If the procedure report does not answer your questions, please call your gastroenterologist to clarify.  If you requested that your care partner not be given the details of your procedure findings, then the procedure report has been included in a sealed envelope for you to review at your convenience later.  YOU SHOULD EXPECT: Some feelings of bloating in the abdomen. Passage of more gas than usual.  Walking can help get rid of the air that was put into your GI tract during the procedure and reduce the bloating. If you had a lower endoscopy (such as a colonoscopy or flexible sigmoidoscopy) you may notice spotting of blood in your stool or on the toilet paper. If you underwent a bowel prep for your procedure, you may not have a normal bowel movement for a few days.  Please Note:  You might notice some irritation and congestion in your nose or some drainage.  This is from the oxygen used during your procedure.  There is no need for concern and it should clear up in a day or so.  SYMPTOMS TO REPORT IMMEDIATELY:   Following lower endoscopy (colonoscopy or flexible sigmoidoscopy):  Excessive amounts of blood in the stool  Significant tenderness or worsening of abdominal pains  Swelling of the abdomen that is new, acute  Fever of 100F or higher   For urgent or emergent issues, a gastroenterologist can be reached at any hour by calling [phone].   DIET:  We do recommend a small meal at first, but then you may proceed to your regular diet.  Drink plenty of fluids but you should avoid alcoholic beverages for 24 hours.  ACTIVITY:  You should plan to take it easy for the rest of today and you  should NOT DRIVE or use heavy machinery until tomorrow (because of the sedation medicines used during the test).    FOLLOW UP: Our staff will call the number listed on your records the next business day following your procedure to check on you and address any questions or concerns that you may have regarding the information given to you following your procedure. If we do not reach you, we will leave a message.  However, if you are feeling well and you are not experiencing any problems, there is no need to return our call.  We will assume that you have returned to your regular daily activities without incident.  If any biopsies were taken you will be contacted by phone or by letter within the next 1-3 weeks.  Please call us at [phone] if you have not heard about the biopsies in 3 weeks.    SIGNATURES/CONFIDENTIALITY: You and/or your care partner have signed paperwork which will be entered into your electronic medical record.  These signatures attest to the fact that that the information above on your After Visit Summary has been reviewed and is understood.  Full responsibility of the confidentiality of this discharge information lies with you and/or your care-partner. 

## 2017-01-01 NOTE — Op Note (Signed)
[facility] Patient Name: Carol Love Procedure Date: 01/01/2017 8:33 AM MRN: [id number] Endoscopist: Mauri Pole , MD Age: 66 Referring MD:  Date of Birth: 1951/01/26 Gender: Female Account #: [id number] Procedure:                Colonoscopy Indications:              Screening for malignant neoplasm in the rectum,                            Last colonoscopy: 2007 Medicines:                Monitored Anesthesia Care Procedure:                Pre-Anesthesia Assessment:                           - Prior to the procedure, a History and Physical                            was performed, and patient medications and                            allergies were reviewed. The patient's tolerance of                            previous anesthesia was also reviewed. The risks                            and benefits of the procedure and the sedation                            options and risks were discussed with the patient.                            All questions were answered, and informed consent                            was obtained. Prior Anticoagulants: The patient has                            taken no previous anticoagulant or antiplatelet                            agents. ASA Grade Assessment: II - A patient with                            mild systemic disease. After reviewing the risks                            and benefits, the patient was deemed in                            satisfactory condition to undergo the procedure.  After obtaining informed consent, the colonoscope                            was passed under direct vision. Throughout the                            procedure, the patient's blood pressure, pulse, and                            oxygen saturations were monitored continuously. The                            Model CF-HQ190L [id number]) scope was introduced                            through the anus and advanced to  the the cecum,                            identified by appendiceal orifice and ileocecal                            valve. The colonoscopy was performed without                            difficulty. The patient tolerated the procedure                            well. The quality of the bowel preparation was                            good. The ileocecal valve, appendiceal orifice, and                            rectum were photographed. Scope In: 8:39:06 AM Scope Out: 8:56:37 AM Scope Withdrawal Time: 0 hours 10 minutes 5 seconds  Total Procedure Duration: 0 hours 17 minutes 31 seconds  Findings:                 The perianal and digital rectal examinations were                            normal.                           A 2 mm polyp was found in the recto-sigmoid colon.                            The polyp was sessile. The polyp was removed with a                            cold biopsy forceps. Resection and retrieval were                            complete.  A few small-mouthed diverticula were found in the                            sigmoid colon.                           Non-bleeding internal hemorrhoids were found during                            retroflexion. The hemorrhoids were medium-sized.                           The exam was otherwise without abnormality. Complications:            No immediate complications. Estimated Blood Loss:     Estimated blood loss was minimal. Impression:               - One 2 mm polyp at the recto-sigmoid colon,                            removed with a cold biopsy forceps. Resected and                            retrieved.                           - Diverticulosis in the sigmoid colon.                           - Non-bleeding internal hemorrhoids.                           - The examination was otherwise normal. Recommendation:           - Patient has a contact number available for                             emergencies. The signs and symptoms of potential                            delayed complications were discussed with the                            patient. Return to normal activities tomorrow.                            Written discharge instructions were provided to the                            patient.                           - Resume previous diet.                           - Continue present medications.                           -  Await pathology results.                           - Repeat colonoscopy in 5-10 years for surveillance                            based on pathology results. Mauri Pole, MD 01/01/2017 9:10:42 AM This report has been signed electronically.

## 2017-01-01 NOTE — Progress Notes (Signed)
To recovery, report to McCoy, RN, VSS 

## 2017-01-01 NOTE — Progress Notes (Signed)
Called to room to assist during endoscopic procedure.  Patient ID and intended procedure confirmed with present staff. Received instructions for my participation in the procedure from the performing physician.  

## 2017-01-02 ENCOUNTER — Telehealth: Payer: Self-pay

## 2017-01-02 NOTE — Telephone Encounter (Signed)
  Follow up Call-  Call back number 01/01/2017  Post procedure Call Back phone  # [phone]  Permission to leave phone message Yes  Some recent data might be hidden     Patient questions:  Do you have a fever, pain , or abdominal swelling? No. Pain Score  0 *  Have you tolerated food without any problems? Yes.    Have you been able to return to your normal activities? Yes.    Do you have any questions about your discharge instructions: Diet   No. Medications  No. Follow up visit  No.  Do you have questions or concerns about your Care? No.  Actions: * If pain score is 4 or above: No action needed, pain <4.

## 2017-01-06 DIAGNOSIS — M25551 Pain in right hip: Secondary | ICD-10-CM | POA: Diagnosis not present

## 2017-01-06 DIAGNOSIS — M25552 Pain in left hip: Secondary | ICD-10-CM | POA: Diagnosis not present

## 2017-01-13 ENCOUNTER — Encounter: Payer: Self-pay | Admitting: Gastroenterology

## 2017-01-27 DIAGNOSIS — M4316 Spondylolisthesis, lumbar region: Secondary | ICD-10-CM | POA: Diagnosis not present

## 2017-02-03 DIAGNOSIS — M79672 Pain in left foot: Secondary | ICD-10-CM | POA: Diagnosis not present

## 2017-02-03 DIAGNOSIS — M25551 Pain in right hip: Secondary | ICD-10-CM | POA: Diagnosis not present

## 2017-02-20 DIAGNOSIS — M79672 Pain in left foot: Secondary | ICD-10-CM | POA: Diagnosis not present

## 2017-03-03 DIAGNOSIS — M25551 Pain in right hip: Secondary | ICD-10-CM | POA: Diagnosis not present

## 2017-03-03 DIAGNOSIS — M25552 Pain in left hip: Secondary | ICD-10-CM | POA: Diagnosis not present

## 2017-05-27 ENCOUNTER — Ambulatory Visit (INDEPENDENT_AMBULATORY_CARE_PROVIDER_SITE_OTHER): Payer: Medicare Other | Admitting: Cardiology

## 2017-05-27 ENCOUNTER — Encounter: Payer: Self-pay | Admitting: Cardiology

## 2017-05-27 VITALS — BP 110/70 | HR 68 | Resp 12 | Ht 59.5 in | Wt 147.8 lb

## 2017-05-27 DIAGNOSIS — I2510 Atherosclerotic heart disease of native coronary artery without angina pectoris: Secondary | ICD-10-CM

## 2017-05-27 DIAGNOSIS — I10 Essential (primary) hypertension: Secondary | ICD-10-CM

## 2017-05-27 DIAGNOSIS — E7800 Pure hypercholesterolemia, unspecified: Secondary | ICD-10-CM | POA: Diagnosis not present

## 2017-05-27 NOTE — Progress Notes (Signed)
Cardiology Office Note:    Date:  05/27/2017   ID:  [name], DOB 10-02-1950, MRN [id number]  PCP:  Levin Erp, MD  Cardiologist:  Jenne Campus, MD    Referring MD: Levin Erp, MD   Chief Complaint  Patient presents with  . Follow-up  Follow-up cardiac issues  History of Present Illness:    Carol Love is a 66 y.o. female  with coronary artery disease: Seems to be doing well from that point of view. Denies having any chest pain tightness squeezing pressure burning chest. Probable with her back issues still ongoing. She had surgery which helped some but still have difficulties. He is she is enjoying her retirement and started became more active. She does have the treadmill walk and tablet few times a week typically up to 3.5 miles per hour with insulin 67%. She walks usually up to 1 miles like that I congratulated her for it and encouraged her to continue doing it.  Past Medical History:  Diagnosis Date  . Anxiety   . Constipation    uses stool softener with stimulant  . Coronary artery disease   . Depression   . Hyperlipidemia   . Hypertension   . Irregular heart beat   . Myocardial infarction [facility]) 2014   stent    Past Surgical History:  Procedure Laterality Date  . APPENDECTOMY  05  . CARDIAC CATHETERIZATION  14   stent  . CARDIOVASCULAR STRESS TEST  03/07/15  . COLONOSCOPY    . CORONARY STENT PLACEMENT    . MAXIMUM ACCESS (MAS)POSTERIOR LUMBAR INTERBODY FUSION (PLIF) 1 LEVEL N/A 03/20/2015   Procedure: FOR MAXIMUM ACCESS (MAS) POSTERIOR LUMBAR INTERBODY FUSION (PLIF) 1 LEVEL;  Surgeon: Earnie Larsson, MD;  Location: [place] [facility];  Service: Neurosurgery;  Laterality: N/A;  FOR MAXIMUM ACCESS (MAS) POSTERIOR LUMBAR INTERBODY FUSION (PLIF) 1 LEVEL LUMBAR FOUR-FIVE  . NASAL SEPTUM SURGERY  1985   s/p fall  . SHOULDER ARTHROSCOPY Left 08    Current Medications: Current Meds  Medication Sig  . acebutolol (SECTRAL) 200 MG capsule Take 200 mg by mouth daily.   Marland Kitchen ALPRAZolam (XANAX) 0.5 MG tablet Take 1 mg by mouth every evening.  Marland Kitchen amitriptyline (ELAVIL) 50 MG tablet Take 50 mg by mouth at bedtime.  Marland Kitchen aspirin 81 MG chewable tablet Chew 162 mg by mouth every evening.  Marland Kitchen atorvastatin (LIPITOR) 80 MG tablet Take 40 mg by mouth daily.  . calcium citrate-vitamin D (CITRACAL+D) 315-200 MG-UNIT per tablet Take 1 tablet by mouth 1 day or 1 dose.  . gabapentin (NEURONTIN) 300 MG capsule Take 300 mg by mouth 4 (four) times daily.   . Multiple Vitamin (MULTIVITAMIN) capsule Take 1 capsule by mouth daily.  . sertraline (ZOLOFT) 100 MG tablet Take 100 mg by mouth daily.  . traMADol (ULTRAM) 50 MG tablet Take 50 mg by mouth every 12 (twelve) hours as needed for moderate pain.   Current Facility-Administered Medications for the 05/27/17 encounter (Office Visit) with Park Liter, MD  Medication  . 0.9 %  sodium chloride infusion     Allergies:   Ibuprofen; Penicillins; and Sulfa antibiotics   Social History   Social History  . Marital status: Married    Spouse name: N/A  . Number of children: N/A  . Years of education: N/A   Social History Main Topics  . Smoking status: Former Smoker    Packs/day: 0.50    Years: 45.00    Types: Cigarettes  Quit date: 10/09/2012  . Smokeless tobacco: Never Used  . Alcohol use 3.0 oz/week    5 Glasses of wine per week     Comment: sev times a week- 1-2 glasses of wine   . Drug use: No  . Sexual activity: Not Asked   Other Topics Concern  . None   Social History Narrative  . None     Family History: The patient's family history includes AAA (abdominal aortic aneurysm) in her mother; Heart disease in her father and mother; Hyperlipidemia in her mother; Lung cancer in her mother. There is no history of Colon cancer, Colon polyps, Esophageal cancer, Rectal cancer, or Stomach cancer. ROS:   Please see the history of present illness.    All 14 point review of systems negative except as described per  history of present illness  EKGs/Labs/Other Studies Reviewed:      Recent Labs: No results found for requested labs within last 8760 hours.  Recent Lipid Panel No results found for: CHOL, TRIG, HDL, CHOLHDL, VLDL, LDLCALC, LDLDIRECT  Physical Exam:    VS:  BP 110/70   Pulse 68   Resp 12   Ht 4' 11.5" (1.511 m)   Wt 147 lb 12.8 oz (67 kg)   BMI 29.35 kg/m     Wt Readings from Last 3 Encounters:  05/27/17 147 lb 12.8 oz (67 kg)  01/01/17 152 lb (68.9 kg)  12/18/16 152 lb (68.9 kg)     GEN:  Well nourished, well developed in no acute distress HEENT: Normal NECK: No JVD; No carotid bruits LYMPHATICS: No lymphadenopathy CARDIAC: RRR, no murmurs, no rubs, no gallops RESPIRATORY:  Clear to auscultation without rales, wheezing or rhonchi  ABDOMEN: Soft, non-tender, non-distended MUSCULOSKELETAL:  No edema; No deformity  SKIN: Warm and dry LOWER EXTREMITIES: no swelling NEUROLOGIC:  Alert and oriented x 3 PSYCHIATRIC:  Normal affect   ASSESSMENT:    1. Coronary artery disease involving native coronary artery of native heart without angina pectoris   2. Essential hypertension   3. Hypercholesterolemia    PLAN:    In order of problems listed above:  1. Coronary artery disease: Stable on appropriate medication which I will continue. EKG done today showed normal sinus rhythm, normal PR interval, incomplete right bundle branch block, no ST-T segment changes 2. Essential hypertension: Blood pressure well-controlled continue present. 3. Lipidemia: She is not fasting today. She doesn't report with her primary care physician next month she will have fasting lipid profile done there.  I will see her back in my office in about 6 months or sooner if she has a problem   Medication Adjustments/Labs and Tests Ordered: Current medicines are reviewed at length with the patient today.  Concerns regarding medicines are outlined above.  Orders Placed This Encounter  Procedures  . EKG  12-Lead   Medication changes: No orders of the defined types were placed in this encounter.   Signed, Park Liter, MD, [facility] 05/27/2017 11:57 AM    [place]

## 2017-05-27 NOTE — Patient Instructions (Signed)
Medication Instructions:  Your physician recommends that you continue on your current medications as directed. Please refer to the Current Medication list given to you today.  Labwork: None   Testing/Procedures: EKG in office today.   Follow-Up: Your physician wants you to follow-up in: 6 months. You will receive a reminder letter in the mail two months in advance. If you don't receive a letter, please call our office to schedule the follow-up appointment.  Any Other Special Instructions Will Be Listed Below (If Applicable).  Please note that any paperwork needing to be filled out by the provider will need to be addressed at the front desk prior to seeing the provider. Please note that any paperwork FMLA, Disability or other documents regarding health condition is subject to a $25.00 charge that must be received prior to completion of paperwork in the form of a money order or check.     If you need a refill on your cardiac medications before your next appointment, please call your pharmacy.

## 2017-07-17 DIAGNOSIS — I10 Essential (primary) hypertension: Secondary | ICD-10-CM | POA: Diagnosis not present

## 2017-07-17 DIAGNOSIS — F419 Anxiety disorder, unspecified: Secondary | ICD-10-CM | POA: Diagnosis not present

## 2017-07-17 DIAGNOSIS — I2510 Atherosclerotic heart disease of native coronary artery without angina pectoris: Secondary | ICD-10-CM | POA: Diagnosis not present

## 2017-07-17 DIAGNOSIS — Z23 Encounter for immunization: Secondary | ICD-10-CM | POA: Diagnosis not present

## 2017-08-08 DIAGNOSIS — Z1231 Encounter for screening mammogram for malignant neoplasm of breast: Secondary | ICD-10-CM | POA: Diagnosis not present

## 2017-10-28 DIAGNOSIS — I2510 Atherosclerotic heart disease of native coronary artery without angina pectoris: Secondary | ICD-10-CM | POA: Diagnosis not present

## 2017-10-28 DIAGNOSIS — I10 Essential (primary) hypertension: Secondary | ICD-10-CM | POA: Diagnosis not present

## 2018-01-19 ENCOUNTER — Telehealth: Payer: Self-pay | Admitting: Cardiology

## 2018-01-19 ENCOUNTER — Other Ambulatory Visit: Payer: Self-pay

## 2018-01-19 MED ORDER — ACEBUTOLOL HCL 200 MG PO CAPS: 200.0000 mg | ORAL_CAPSULE | Freq: Every day | ORAL | 0 refills | Status: DC

## 2018-01-19 NOTE — Telephone Encounter (Signed)
Estill Bamberg, CMA, sent in 30 day supply of acebutolol to pharmacy. Left patient voicemail on home phone to call our office and schedule a follow up appointment.

## 2018-01-19 NOTE — Telephone Encounter (Signed)
[facility] [place] [facility] [facility] [place]

## 2018-01-20 DIAGNOSIS — H524 Presbyopia: Secondary | ICD-10-CM | POA: Diagnosis not present

## 2018-01-26 ENCOUNTER — Encounter: Payer: Self-pay | Admitting: Cardiology

## 2018-01-26 ENCOUNTER — Ambulatory Visit: Payer: Medicare Other | Admitting: Cardiology

## 2018-01-26 VITALS — BP 144/70 | HR 67 | Ht 59.5 in | Wt 144.0 lb

## 2018-01-26 DIAGNOSIS — I10 Essential (primary) hypertension: Secondary | ICD-10-CM

## 2018-01-26 DIAGNOSIS — E7800 Pure hypercholesterolemia, unspecified: Secondary | ICD-10-CM | POA: Diagnosis not present

## 2018-01-26 DIAGNOSIS — I2510 Atherosclerotic heart disease of native coronary artery without angina pectoris: Secondary | ICD-10-CM | POA: Diagnosis not present

## 2018-01-26 NOTE — Progress Notes (Signed)
Cardiology Office Note:    Date:  01/26/2018   ID:  Carol Love, DOB June 19, 1951, MRN [id number]  PCP:  Levin Erp, MD  Cardiologist:  Jenne Campus, MD    Referring MD: Levin Erp, MD   Chief Complaint  Patient presents with  . Follow-up  Doing well  History of Present Illness:    Carol Love is a 67 y.o. female with coronary artery disease status post myocardial infarction 2014 since that time she is doing very well asymptomatic no chest pain tightness squeezing pressure burning chest.  She is very happy with her retirements she is very busy in her property cleaning and working the garden.  She also goes on the treadmill and the regular basis have no difficulty doing it.  Does have chronic back problem that seems to be under control.  Past Medical History:  Diagnosis Date  . Anxiety   . Constipation    uses stool softener with stimulant  . Coronary artery disease   . Depression   . Hyperlipidemia   . Hypertension   . Irregular heart beat   . Myocardial infarction [facility]) 2014   stent    Past Surgical History:  Procedure Laterality Date  . APPENDECTOMY  05  . CARDIAC CATHETERIZATION  14   stent  . CARDIOVASCULAR STRESS TEST  03/07/15  . COLONOSCOPY    . CORONARY STENT PLACEMENT    . MAXIMUM ACCESS (MAS)POSTERIOR LUMBAR INTERBODY FUSION (PLIF) 1 LEVEL N/A 03/20/2015   Procedure: FOR MAXIMUM ACCESS (MAS) POSTERIOR LUMBAR INTERBODY FUSION (PLIF) 1 LEVEL;  Surgeon: Earnie Larsson, MD;  Location: [place] [facility];  Service: Neurosurgery;  Laterality: N/A;  FOR MAXIMUM ACCESS (MAS) POSTERIOR LUMBAR INTERBODY FUSION (PLIF) 1 LEVEL LUMBAR FOUR-FIVE  . NASAL SEPTUM SURGERY  1985   s/p fall  . SHOULDER ARTHROSCOPY Left 08    Current Medications: Current Meds  Medication Sig  . acebutolol (SECTRAL) 200 MG capsule Take 1 capsule (200 mg total) by mouth daily. Appointment need for more refills  . ALPRAZolam (XANAX) 0.5 MG tablet Take 1 mg by mouth every evening.  Marland Kitchen  amitriptyline (ELAVIL) 50 MG tablet Take 50 mg by mouth at bedtime.  Marland Kitchen aspirin 81 MG chewable tablet Chew 162 mg by mouth every evening.  Marland Kitchen atorvastatin (LIPITOR) 80 MG tablet Take 40 mg by mouth daily.  . calcium citrate-vitamin D (CITRACAL+D) 315-200 MG-UNIT per tablet Take 1 tablet by mouth 1 day or 1 dose.  . gabapentin (NEURONTIN) 300 MG capsule Take 300 mg by mouth 4 (four) times daily.   . meloxicam (MOBIC) 15 MG tablet Take 1 tablet by mouth daily.  . Multiple Vitamin (MULTIVITAMIN) capsule Take 1 capsule by mouth daily.  . sertraline (ZOLOFT) 100 MG tablet Take 100 mg by mouth daily.   Current Facility-Administered Medications for the 01/26/18 encounter (Office Visit) with Park Liter, MD  Medication  . 0.9 %  sodium chloride infusion     Allergies:   Ibuprofen; Penicillins; and Sulfa antibiotics   Social History   Socioeconomic History  . Marital status: Married    Spouse name: Not on file  . Number of children: Not on file  . Years of education: Not on file  . Highest education level: Not on file  Occupational History  . Not on file  Social Needs  . Financial resource strain: Not on file  . Food insecurity:    Worry: Not on file    Inability: Not on file  .  Transportation needs:    Medical: Not on file    Non-medical: Not on file  Tobacco Use  . Smoking status: Former Smoker    Packs/day: 0.50    Years: 45.00    Pack years: 22.50    Types: Cigarettes    Last attempt to quit: 10/09/2012    Years since quitting: 5.3  . Smokeless tobacco: Never Used  Substance and Sexual Activity  . Alcohol use: Yes    Alcohol/week: 3.0 oz    Types: 5 Glasses of wine per week    Comment: sev times a week- 1-2 glasses of wine   . Drug use: No  . Sexual activity: Not on file  Lifestyle  . Physical activity:    Days per week: Not on file    Minutes per session: Not on file  . Stress: Not on file  Relationships  . Social connections:    Talks on phone: Not on file     Gets together: Not on file    Attends religious service: Not on file    Active member of club or organization: Not on file    Attends meetings of clubs or organizations: Not on file    Relationship status: Not on file  Other Topics Concern  . Not on file  Social History Narrative  . Not on file     Family History: The patient's family history includes AAA (abdominal aortic aneurysm) in her mother; Heart disease in her father and mother; Hyperlipidemia in her mother; Lung cancer in her mother. There is no history of Colon cancer, Colon polyps, Esophageal cancer, Rectal cancer, or Stomach cancer. ROS:   Please see the history of present illness.    All 14 point review of systems negative except as described per history of present illness  EKGs/Labs/Other Studies Reviewed:      Recent Labs: No results found for requested labs within last 8760 hours.  Recent Lipid Panel No results found for: CHOL, TRIG, HDL, CHOLHDL, VLDL, LDLCALC, LDLDIRECT  Physical Exam:    VS:  BP (!) 144/70   Pulse 67   Ht 4' 11.5" (1.511 m)   Wt 144 lb (65.3 kg)   SpO2 98%   BMI 28.60 kg/m     Wt Readings from Last 3 Encounters:  01/26/18 144 lb (65.3 kg)  05/27/17 147 lb 12.8 oz (67 kg)  01/01/17 152 lb (68.9 kg)     GEN:  Well nourished, well developed in no acute distress HEENT: Normal NECK: No JVD; No carotid bruits LYMPHATICS: No lymphadenopathy CARDIAC: RRR, no murmurs, no rubs, no gallops RESPIRATORY:  Clear to auscultation without rales, wheezing or rhonchi  ABDOMEN: Soft, non-tender, non-distended MUSCULOSKELETAL:  No edema; No deformity  SKIN: Warm and dry LOWER EXTREMITIES: no swelling NEUROLOGIC:  Alert and oriented x 3 PSYCHIATRIC:  Normal affect   ASSESSMENT:    1. Coronary artery disease involving native coronary artery of native heart without angina pectoris   2. Essential hypertension   3. Hypercholesterolemia    PLAN:    In order of problems listed  above:  1. Coronary artery disease status post microinfarction 2014 stable asymptomatic doing very well continue present management 2. Essential hypertension blood pressure well controlled continue present management 3. Lipidemia will call primary care physician to get fasting lipid profile     Medication Adjustments/Labs and Tests Ordered: Current medicines are reviewed at length with the patient today.  Concerns regarding medicines are outlined above.  No orders of the  defined types were placed in this encounter.  Medication changes: No orders of the defined types were placed in this encounter.   Signed, Park Liter, MD, [facility] 01/26/2018 4:00 PM    [place]

## 2018-01-26 NOTE — Patient Instructions (Signed)

## 2018-02-19 ENCOUNTER — Other Ambulatory Visit: Payer: Self-pay | Admitting: *Deleted

## 2018-02-19 MED ORDER — ACEBUTOLOL HCL 200 MG PO CAPS: 200.0000 mg | ORAL_CAPSULE | Freq: Every day | ORAL | 9 refills | Status: DC

## 2018-02-19 NOTE — Telephone Encounter (Signed)
Refill for acebutolol sent to [place].

## 2018-02-23 ENCOUNTER — Telehealth: Payer: Self-pay | Admitting: *Deleted

## 2018-02-23 MED ORDER — ACEBUTOLOL HCL 200 MG PO CAPS: 200.0000 mg | ORAL_CAPSULE | Freq: Every day | ORAL | 9 refills | Status: DC

## 2018-02-23 NOTE — Telephone Encounter (Signed)
Pt called to let us know that pharmacy is still saying pt needs f/u appt with dr before more refills. The note stating she needs refills was still in the rx notes so I will go in take this off.

## 2018-03-03 DIAGNOSIS — L219 Seborrheic dermatitis, unspecified: Secondary | ICD-10-CM | POA: Diagnosis not present

## 2018-03-23 ENCOUNTER — Other Ambulatory Visit: Payer: Self-pay

## 2018-03-23 MED ORDER — ATORVASTATIN CALCIUM 80 MG PO TABS: 80.0000 mg | ORAL_TABLET | Freq: Every day | ORAL | 6 refills | Status: DC

## 2018-06-15 ENCOUNTER — Telehealth: Payer: Self-pay | Admitting: Cardiology

## 2018-06-15 DIAGNOSIS — E7800 Pure hypercholesterolemia, unspecified: Secondary | ICD-10-CM

## 2018-06-15 MED ORDER — ATORVASTATIN CALCIUM 40 MG PO TABS: 40.0000 mg | ORAL_TABLET | Freq: Every day | ORAL | 3 refills | Status: DC

## 2018-06-15 NOTE — Telephone Encounter (Signed)
Patient informed that lab work is needed per Dr. Agustin Cree and refill of lipitor 40 mg daily has been sent in to pharmacy. Patient verbally understands.

## 2018-06-15 NOTE — Telephone Encounter (Signed)
Patient would like to have her Atorvastatin called in to the [facility] in [place] for 40mg  and not 80.  Her insurance states that she is being non compliant and it effects her Ins.. Please call her and speak with patient if necessary.

## 2018-06-15 NOTE — Telephone Encounter (Signed)
Patient reports having cholesterol checked in October 2018 with pcp.

## 2018-06-15 NOTE — Telephone Encounter (Signed)
Patient reports taking 40 mg of lipitor for al most two years however her prescription reads 80 mg. Will confirm with DR. Agustin Cree and her recent lab results.

## 2018-06-15 NOTE — Addendum Note (Signed)
Addended by: Ashok Norris on: 06/15/2018 02:10 PM   Modules accepted: Orders

## 2018-06-18 DIAGNOSIS — E7800 Pure hypercholesterolemia, unspecified: Secondary | ICD-10-CM | POA: Diagnosis not present

## 2018-06-18 LAB — LIPID PANEL
Chol/HDL Ratio: 2.9 ratio (ref 0.0–4.4)
Cholesterol, Total: 151 mg/dL (ref 100–199)
HDL: 52 mg/dL (ref >39)
LDL Calculated: 70 mg/dL (ref 0–99)
Triglycerides: 143 mg/dL (ref 0–149)
VLDL Cholesterol Cal: 29 mg/dL (ref 5–40)

## 2018-07-15 DIAGNOSIS — L259 Unspecified contact dermatitis, unspecified cause: Secondary | ICD-10-CM | POA: Diagnosis not present

## 2018-07-15 DIAGNOSIS — G8929 Other chronic pain: Secondary | ICD-10-CM | POA: Insufficient documentation

## 2018-07-15 HISTORY — DX: Other chronic pain: G89.29

## 2018-07-30 DIAGNOSIS — M4316 Spondylolisthesis, lumbar region: Secondary | ICD-10-CM | POA: Diagnosis not present

## 2018-08-03 DIAGNOSIS — I10 Essential (primary) hypertension: Secondary | ICD-10-CM | POA: Diagnosis not present

## 2018-08-03 DIAGNOSIS — E785 Hyperlipidemia, unspecified: Secondary | ICD-10-CM | POA: Diagnosis not present

## 2018-08-03 DIAGNOSIS — E7800 Pure hypercholesterolemia, unspecified: Secondary | ICD-10-CM | POA: Diagnosis not present

## 2018-08-03 DIAGNOSIS — I2510 Atherosclerotic heart disease of native coronary artery without angina pectoris: Secondary | ICD-10-CM | POA: Diagnosis not present

## 2018-08-03 DIAGNOSIS — Z6841 Body Mass Index (BMI) 40.0 and over, adult: Secondary | ICD-10-CM | POA: Diagnosis not present

## 2018-10-13 DIAGNOSIS — M4316 Spondylolisthesis, lumbar region: Secondary | ICD-10-CM | POA: Diagnosis not present

## 2018-10-19 ENCOUNTER — Other Ambulatory Visit: Payer: Self-pay | Admitting: Neurosurgery

## 2018-10-19 DIAGNOSIS — M4316 Spondylolisthesis, lumbar region: Secondary | ICD-10-CM

## 2018-10-20 ENCOUNTER — Ambulatory Visit
Admission: RE | Admit: 2018-10-20 | Discharge: 2018-10-20 | Disposition: A | Discharge status: Home / Self Care | Payer: Medicare Other | Source: Ambulatory Visit | Attending: Neurosurgery | Admitting: Neurosurgery

## 2018-10-20 DIAGNOSIS — M48061 Spinal stenosis, lumbar region without neurogenic claudication: Secondary | ICD-10-CM | POA: Diagnosis not present

## 2018-10-20 DIAGNOSIS — M4316 Spondylolisthesis, lumbar region: Secondary | ICD-10-CM

## 2018-11-11 ENCOUNTER — Other Ambulatory Visit: Payer: Self-pay | Admitting: Neurosurgery

## 2018-11-11 DIAGNOSIS — M4316 Spondylolisthesis, lumbar region: Secondary | ICD-10-CM | POA: Diagnosis not present

## 2018-11-18 ENCOUNTER — Encounter (HOSPITAL_COMMUNITY): Payer: Self-pay | Admitting: Vascular Surgery

## 2018-11-18 NOTE — Pre-Procedure Instructions (Signed)
Carol Love  11/18/2018      [facility] - [name] - [address] [place] [address] Phone: [phone] Fax: [phone]    Your procedure is scheduled on March 6  Report to [facility] at 7:45 A.M.  Call this number if you have problems the morning of surgery:  [phone]   Remember:  Do not eat or drink after midnight.      Take these medicines the morning of surgery with A SIP OF WATER :               Acebutolol (sectral)              Sertraline (zoloft)              Tramadol if needed                            7 days prior to surgery STOP taking any Aspirin (unless otherwise instructed by your surgeon), Aleve, Naproxen, Ibuprofen, Motrin, Advil, Goody's, BC's, all herbal medications, fish oil, and all vitamins.                  Follow your surgeon's instructions on when to stop Asprin.  If no instructions were given by your surgeon then you will need to call the office to get those instructions.                     Do not wear jewelry, make-up or nail polish.  Do not wear lotions, powders, or perfumes, or deodorant.  Do not shave 48 hours prior to surgery.  Men may shave face and neck.  Do not bring valuables to the hospital.  [facility] is not responsible for any belongings or valuables.  Contacts, dentures or bridgework may not be worn into surgery.  Leave your suitcase in the car.  After surgery it may be brought to your room.  For patients admitted to the hospital, discharge time will be determined by your treatment team.  Patients discharged the day of surgery will not be allowed to drive home.    Special instructions:   [place]- Preparing For Surgery  Before surgery, you can play an important role. Because skin is not sterile, your skin needs to be as free of germs as possible. You can reduce the number of germs on your skin by washing with CHG  (chlorahexidine gluconate) Soap before surgery.  CHG is an antiseptic cleaner which kills germs and bonds with the skin to continue killing germs even after washing.    Oral Hygiene is also important to reduce your risk of infection.  Remember - BRUSH YOUR TEETH THE MORNING OF SURGERY WITH YOUR REGULAR TOOTHPASTE  Please do not use if you have an allergy to CHG or antibacterial soaps. If your skin becomes reddened/irritated stop using the CHG.  Do not shave (including legs and underarms) for at least 48 hours prior to first CHG shower. It is OK to shave your face.  Please follow these instructions carefully.   1. Shower the NIGHT BEFORE SURGERY and the MORNING OF SURGERY with CHG.   2. If you chose to wash your hair, wash your hair first as usual with your normal shampoo.  3. After you shampoo, rinse your hair and body thoroughly to remove the shampoo.  4. Use CHG as you  would any other liquid soap. You can apply CHG directly to the skin and wash gently with a scrungie or a clean washcloth.   5. Apply the CHG Soap to your body ONLY FROM THE NECK DOWN.  Do not use on open wounds or open sores. Avoid contact with your eyes, ears, mouth and genitals (private parts). Wash Face and genitals (private parts)  with your normal soap.  6. Wash thoroughly, paying special attention to the area where your surgery will be performed.  7. Thoroughly rinse your body with warm water from the neck down.  8. DO NOT shower/wash with your normal soap after using and rinsing off the CHG Soap.  9. Pat yourself dry with a CLEAN TOWEL.  10. Wear CLEAN PAJAMAS to bed the night before surgery, wear comfortable clothes the morning of surgery  11. Place CLEAN SHEETS on your bed the night of your first shower and DO NOT SLEEP WITH PETS.    Day of Surgery:  Do not apply any deodorants/lotions.  Please wear clean clothes to the hospital/surgery center.   Remember to brush your teeth WITH YOUR REGULAR  TOOTHPASTE.    Please read over the following fact sheets that you were given. Coughing and Deep Breathing, MRSA Information and Surgical Site Infection Prevention

## 2018-11-19 ENCOUNTER — Other Ambulatory Visit: Payer: Self-pay

## 2018-11-19 ENCOUNTER — Encounter (HOSPITAL_COMMUNITY): Payer: Self-pay

## 2018-11-19 ENCOUNTER — Encounter (HOSPITAL_COMMUNITY)
Admission: RE | Admit: 2018-11-19 | Discharge: 2018-11-19 | Disposition: A | Discharge status: Home / Self Care | Payer: Medicare Other | Source: Ambulatory Visit | Attending: Neurosurgery | Admitting: Neurosurgery

## 2018-11-19 DIAGNOSIS — Z01818 Encounter for other preprocedural examination: Secondary | ICD-10-CM | POA: Insufficient documentation

## 2018-11-19 HISTORY — DX: Headache, unspecified: R51.9

## 2018-11-19 HISTORY — DX: Headache: R51

## 2018-11-19 HISTORY — DX: Unspecified osteoarthritis, unspecified site: M19.90

## 2018-11-19 LAB — CBC WITH DIFFERENTIAL/PLATELET
Abs Immature Granulocytes: 0.01 10*3/uL (ref 0.00–0.07)
BASOS ABS: 0 10*3/uL (ref 0.0–0.1)
Basophils Relative: 1 %
Eosinophils Absolute: 0.3 10*3/uL (ref 0.0–0.5)
Eosinophils Relative: 6 %
HCT: 41.1 % (ref 36.0–46.0)
Hemoglobin: 14.4 g/dL (ref 12.0–15.0)
Immature Granulocytes: 0 %
Lymphocytes Relative: 25 %
Lymphs Abs: 1.4 10*3/uL (ref 0.7–4.0)
MCH: 32.8 pg (ref 26.0–34.0)
MCHC: 35 g/dL (ref 30.0–36.0)
MCV: 93.6 fL (ref 80.0–100.0)
Monocytes Absolute: 0.4 10*3/uL (ref 0.1–1.0)
Monocytes Relative: 7 %
NEUTROS PCT: 61 %
Neutro Abs: 3.5 10*3/uL (ref 1.7–7.7)
PLATELETS: 207 10*3/uL (ref 150–400)
RBC: 4.39 MIL/uL (ref 3.87–5.11)
RDW: 11.7 % (ref 11.5–15.5)
WBC: 5.6 10*3/uL (ref 4.0–10.5)
nRBC: 0 % (ref 0.0–0.2)

## 2018-11-19 LAB — BASIC METABOLIC PANEL
Anion gap: 6 (ref 5–15)
BUN: 13 mg/dL (ref 8–23)
CO2: 30 mmol/L (ref 22–32)
CREATININE: 0.87 mg/dL (ref 0.44–1.00)
Calcium: 9.3 mg/dL (ref 8.9–10.3)
Chloride: 103 mmol/L (ref 98–111)
GFR calc Af Amer: >60 mL/min (ref >60)
GFR calc non Af Amer: >60 mL/min (ref >60)
Glucose, Bld: 97 mg/dL (ref 70–99)
Potassium: 4 mmol/L (ref 3.5–5.1)
Sodium: 139 mmol/L (ref 135–145)

## 2018-11-19 LAB — SURGICAL PCR SCREEN
MRSA, PCR: NEGATIVE
Staphylococcus aureus: POSITIVE — AB

## 2018-11-19 LAB — TYPE AND SCREEN
ABO/RH(D): O POS
Antibody Screen: NEGATIVE

## 2018-11-19 NOTE — Pre-Procedure Instructions (Signed)
RENLY GUEDES  11/19/2018      [facility] - [place] - [address] [place] [address] Phone: [phone] Fax: [phone]    Your procedure is scheduled on Friday,  March 6   Report to [facility] Admitting at 7:45 A.M.             (posted surgery time 9:34a - 1:34p)   Call this number if you have problems the morning of surgery:  [phone]   Remember:   Do not eat any foods or drink any liquids after midnight, Thursday.      Take these medicines the morning of surgery with A SIP OF WATER :               Acebutolol (sectral)              Sertraline (zoloft)              Tramadol if needed                            7 days prior to surgery STOP taking any Aspirin (unless otherwise instructed by your surgeon), Aleve, Naproxen, Ibuprofen, Motrin, Advil, Goody's, BC's, all herbal medications, fish oil, and all vitamins.                  Follow your surgeon's instructions on when to stop Asprin.  If no instructions were given by your surgeon then you will need to call the office to get those instructions.                     Do not wear jewelry, make-up or nail polish.  Do not wear lotions, powders,  perfumes, or deodorant.  Do not shave 48 hours prior to surgery.   Do not bring valuables to the hospital.  [facility] is not responsible for any belongings or valuables.  Contacts, dentures or bridgework may not be worn into surgery.  Leave your suitcase in the car.  After surgery it may be brought to your room.  For patients admitted to the hospital, discharge time will be determined by your treatment team.      Special instructions:   [place]- Preparing For Surgery  Before surgery, you can play an important role. Because skin is not sterile, your skin needs to be as free of germs as possible. You can reduce the number of germs on your skin by washing with CHG  (chlorahexidine gluconate) Soap before surgery.  CHG is an antiseptic cleaner which kills germs and bonds with the skin to continue killing germs even after washing.    Oral Hygiene is also important to reduce your risk of infection.    Remember - BRUSH YOUR TEETH THE MORNING OF SURGERY WITH YOUR REGULAR TOOTHPASTE  Please do not use if you have an allergy to CHG or antibacterial soaps. If your skin becomes reddened/irritated stop using the CHG.  Do not shave (including legs and underarms) for at least 48 hours prior to first CHG shower. It is OK to shave your face.  Please follow these instructions carefully.   1. Shower the NIGHT BEFORE SURGERY and the MORNING OF SURGERY with CHG.   2. If you chose to wash your hair, wash your hair first as usual with your normal shampoo.  3. After you shampoo, rinse your hair and  body thoroughly to remove the shampoo.  4. Use CHG as you would any other liquid soap. You can apply CHG directly to the skin and wash gently with a scrungie or a clean washcloth.   5. Apply the CHG Soap to your body ONLY FROM THE NECK DOWN.  Do not use on open wounds or open sores. Avoid contact with your eyes, ears, mouth and genitals (private parts). Wash Face and genitals (private parts)  with your normal soap.  6. Wash thoroughly, paying special attention to the area where your surgery will be performed.  7. Thoroughly rinse your body with warm water from the neck down.  8. DO NOT shower/wash with your normal soap after using and rinsing off the CHG Soap.  9. Pat yourself dry with a CLEAN TOWEL.  10. Wear CLEAN PAJAMAS to bed the night before surgery, wear comfortable clothes the morning of surgery  11. Place CLEAN SHEETS on your bed the night of your first shower and DO NOT SLEEP WITH PETS.  Day of Surgery:  Do not apply any deodorants/lotions.  Please wear clean clothes to the hospital/surgery center.   Remember to brush your teeth WITH YOUR REGULAR  TOOTHPASTE.  Please read over the following fact sheets that you were given.

## 2018-11-19 NOTE — Progress Notes (Signed)
PCP - Dr.  Levin Erp  [place] 06/2018  Cardiologist - Dr. Jenne Campus   [place] 01/2018  Chest x-ray -   EKG - 11/19/2018  Stress Test - 2016  (I have faxed a request to [facility] for copies of both stress & echo)  ECHO - 2017 (in [place])  Cardiac Cath - 2014  Sleep Study - denies CPAP - denies  Fasting Blood Sugar -  Checks Blood Sugar _____ times a day  Blood Thinner Instructions: Aspirin Instructions: will be stopping basa soon  Anesthesia review:  Yes pls  Patient denies shortness of breath, fever, cough and chest pain at PAT appointment   Patient verbalized understanding of instructions that were given to them at the PAT appointment. Patient was also instructed that they will need to review over the PAT instructions again at home before surgery.

## 2018-11-20 NOTE — Progress Notes (Addendum)
Anesthesia Chart Review:  Case:  [id number] Date/Time:  11/27/18 0930   Procedure:  PLIF - L2-L3 - L3-L4 (N/A Back)   Anesthesia type:  General   Pre-op diagnosis:  Spondylolisthesis   Location:  [facility] OR ROOM 71 / [place] OR   Surgeon:  Carol Larsson, MD      DISCUSSION: Patient is a 68 year old female scheduled for the above procedure.  History includes former smoker (quit 2014), CAD (STEMI, s/p thrombectomy/DES RCA 12/13/12), HTN, HLD, irregular heart beat (without mention of afib), L4-5 PLIF 03/20/15.  Last seen by cardiologist Dr. Agustin Love 01/26/18 and was doing well at that time. Six month follow-up recommended. Last stress test 2016. Last echo 2017 and last cath 2014 (attempting to get copies).  Discussed with anesthesiologist Carol Morn, MD. Recommend preoperative cardiology input. Carol Love at Carol Love office notified and will contact Carol Love office for input.       ADDENDUM 11/26/18 3:09 PM: Echo report from 2017 showed normal EF, mild-moderate MR. Patient was seen by Carol Heinz, MD on 11/24/18 for preoperative evaluation. Stress test ordered (see below). Based on stress test findings, Dr. Geraldo Love would not clear her for surgery at this time.   VS: BP (!) 154/80   Pulse 69   Temp 36.5 C   Resp 18   Ht 4' 11.5" (1.511 m)   Wt 65.5 kg   SpO2 97%   BMI 28.66 kg/m    PROVIDERS: Carol Erp, MD is PCP Carol Campus, MD is cardiologist. Last visit  01/26/18. Patient doing well at that time and able to work in the garden and walk on the treadmill on a regular basis. Six month follow-up recommended.   LABS: Labs reviewed: Acceptable for surgery. (all labs ordered are listed, but only abnormal results are displayed)  Labs Reviewed  SURGICAL PCR SCREEN - Abnormal; Notable for the following components:      Result Value   Staphylococcus aureus POSITIVE (*)    All other components within normal limits  CBC WITH DIFFERENTIAL/PLATELET  BASIC METABOLIC PANEL  TYPE AND  SCREEN    IMAGES: CT L-spine 10/20/18: IMPRESSION: 1. Status post L4-5 PLIF with arthrodesis. 2. Worsening L3-4 adjacent segment disease, widened facets associated with dynamic instability. New moderate RIGHT L3-4 disc protrusion may affect the exited RIGHT L3 nerve. 3. Grade 1 L2-3 retrolisthesis and grade 1 L4-5 anterolisthesis. 4. Moderate canal stenosis L3-4.  Mild canal stenosis L2-3. 5. Multilevel neural foraminal narrowing: Moderate to severe on the RIGHT at L3-4. Aortic Atherosclerosis (ICD10-I70.0).   EKG: 11/19/18: NSR.   CV: Nuclear stress test 11/26/18:  The left ventricular ejection fraction is normal (55-65%).  Nuclear stress EF: 64%.  There was no ST segment deviation noted during stress.  Defect 1: There is a medium defect of moderate severity present in the mid anterior, apical anterior and apex location.  This is a low risk study. Abnormal, low risk stress nuclear study with a partially reversible distal anterior and apical defect; possible shifting breast attenuation but mild ischemia in the distal anterior wall/apex cannot be ruled out.  Gated ejection fraction 64% with normal wall motion.  Echo 06/07/16 [facility]): Conclusion:  1.  Left ventricle cavity is normal in size.  Calculated EF 60 to 65%. 2.  Mild to moderate mitral regurgitation.   3.  Trace tricuspid regurgitation. 4.  Trace pulmonic insufficiency.  Cardiac cath 12/13/12: Attempting to get report from [facility].   Past Medical History:  Diagnosis Date  .  Anxiety   . Arthritis   . Constipation    uses stool softener with stimulant  . Coronary artery disease   . Depression   . Headache    last h/a  2000  . Hyperlipidemia   . Hypertension   . Irregular heart beat   . Myocardial infarction [facility]) 2014   stent    Past Surgical History:  Procedure Laterality Date  . APPENDECTOMY  05  . CARDIAC CATHETERIZATION  14   stent  . CARDIOVASCULAR STRESS TEST   03/07/15  . COLONOSCOPY    . CORONARY STENT PLACEMENT    . DILATION AND CURETTAGE OF UTERUS    . MAXIMUM ACCESS (MAS)POSTERIOR LUMBAR INTERBODY FUSION (PLIF) 1 LEVEL N/A 03/20/2015   Procedure: FOR MAXIMUM ACCESS (MAS) POSTERIOR LUMBAR INTERBODY FUSION (PLIF) 1 LEVEL;  Surgeon: Carol Larsson, MD;  Location: [facility];  Service: Neurosurgery;  Laterality: N/A;  FOR MAXIMUM ACCESS (MAS) POSTERIOR LUMBAR INTERBODY FUSION (PLIF) 1 LEVEL LUMBAR FOUR-FIVE  . NASAL SEPTUM SURGERY  1985   s/p fall  . SHOULDER ARTHROSCOPY Left 08  . TONSILLECTOMY      MEDICATIONS: . acebutolol (SECTRAL) 200 MG capsule  . ALPRAZolam (XANAX) 0.5 MG tablet  . amitriptyline (ELAVIL) 50 MG tablet  . aspirin 81 MG chewable tablet  . atorvastatin (LIPITOR) 40 MG tablet  . calcium citrate-vitamin D (CITRACAL+D) 315-200 MG-UNIT per tablet  . gabapentin (NEURONTIN) 300 MG capsule  . meloxicam (MOBIC) 15 MG tablet  . Multiple Vitamin (MULTIVITAMIN) capsule  . sertraline (ZOLOFT) 100 MG tablet  . traMADol (ULTRAM) 50 MG tablet  . triamcinolone cream (KENALOG) 0.1 %   . 0.9 %  sodium chloride infusion  Reported to PAT RN that she will be holding ASA for surgery.  Carol Gianotti, PA-C Surgical Short Stay/Anesthesiology [facility] Phone [phone] [facility] Phone [phone] 11/20/2018 2:43 PM

## 2018-11-24 ENCOUNTER — Encounter: Payer: Self-pay | Admitting: Cardiology

## 2018-11-24 ENCOUNTER — Ambulatory Visit: Payer: Medicare Other | Admitting: Cardiology

## 2018-11-24 VITALS — BP 116/72 | HR 65 | Ht 59.5 in | Wt 143.8 lb

## 2018-11-24 DIAGNOSIS — E7800 Pure hypercholesterolemia, unspecified: Secondary | ICD-10-CM | POA: Diagnosis not present

## 2018-11-24 DIAGNOSIS — I10 Essential (primary) hypertension: Secondary | ICD-10-CM

## 2018-11-24 DIAGNOSIS — Z01810 Encounter for preprocedural cardiovascular examination: Secondary | ICD-10-CM | POA: Diagnosis not present

## 2018-11-24 DIAGNOSIS — I2510 Atherosclerotic heart disease of native coronary artery without angina pectoris: Secondary | ICD-10-CM

## 2018-11-24 NOTE — Patient Instructions (Signed)
Medication Instructions:  Your physician recommends that you continue on your current medications as directed. Please refer to the Current Medication list given to you today.   If you need a refill on your cardiac medications before your next appointment, please call your pharmacy.   Lab work: None.  If you have labs (blood work) drawn today and your tests are completely normal, you will receive your results only by: Mar[facility] Kitchen MyChart Message (if you have MyChart) OR . A paper copy in the mail If you have any lab test that is abnormal or we need to change your treatment, we will call you to review the results.  Testing/Procedures: Your physician has requested that you have a lexiscan myoview. For further information please visit [email or web address]. Please follow instruction sheet, as given.    Follow-Up:  Follow up with Dr. Agustin Cree as planned.   Any Other Special Instructions Will Be Listed Below (If Applicable).    Cardiac Nuclear Scan A cardiac nuclear scan is a test that measures blood flow to the heart when a person is resting and when he or she is exercising. The test looks for problems such as:  Not enough blood reaching a portion of the heart.  The heart muscle not working normally. You may need this test if:  You have heart disease.  You have had abnormal lab results.  You have had heart surgery or a balloon procedure to open up blocked arteries (angioplasty).  You have chest pain.  You have shortness of breath. In this test, a radioactive dye (tracer) is injected into your bloodstream. After the tracer has traveled to your heart, an imaging device is used to measure how much of the tracer is absorbed by or distributed to various areas of your heart. This procedure is usually done at a hospital and takes 2-4 hours. Tell a health care provider about:  Any allergies you have.  All medicines you are taking, including vitamins, herbs, eye drops, creams, and  over-the-counter medicines.  Any problems you or family members have had with anesthetic medicines.  Any blood disorders you have.  Any surgeries you have had.  Any medical conditions you have.  Whether you are pregnant or may be pregnant. What are the risks? Generally, this is a safe procedure. However, problems may occur, including:  Serious chest pain and heart attack. This is only a risk if the stress portion of the test is done.  Rapid heartbeat.  Sensation of warmth in your chest. This usually passes quickly.  Allergic reaction to the tracer. What happens before the procedure?  Ask your health care provider about changing or stopping your regular medicines. This is especially important if you are taking diabetes medicines or blood thinners.  Follow instructions from your health care provider about eating or drinking restrictions.  Remove your jewelry on the day of the procedure. What happens during the procedure?  An IV will be inserted into one of your veins.  Your health care provider will inject a small amount of radioactive tracer through the IV.  You will wait for 20-40 minutes while the tracer travels through your bloodstream.  Your heart activity will be monitored with an electrocardiogram (ECG).  You will lie down on an exam table.  Images of your heart will be taken for about 15-20 minutes.  You may also have a stress test. For this test, one of the following may be done: ? You will exercise on a treadmill or stationary bike. While you exercise,  your heart's activity will be monitored with an ECG, and your blood pressure will be checked. ? You will be given medicines that will increase blood flow to parts of your heart. This is done if you are unable to exercise.  When blood flow to your heart has peaked, a tracer will again be injected through the IV.  After 20-40 minutes, you will get back on the exam table and have more images taken of your  heart.  Depending on the type of tracer used, scans may need to be repeated 3-4 hours later.  Your IV line will be removed when the procedure is over. The procedure may vary among health care providers and hospitals. What happens after the procedure?  Unless your health care provider tells you otherwise, you may return to your normal schedule, including diet, activities, and medicines.  Unless your health care provider tells you otherwise, you may increase your fluid intake. This will help to flush the contrast dye from your body. Drink enough fluid to keep your urine pale yellow.  Ask your health care provider, or the department that is doing the test: ? When will my results be ready? ? How will I get my results? Summary  A cardiac nuclear scan measures the blood flow to the heart when a person is resting and when he or she is exercising.  Tell your health care provider if you are pregnant.  Before the procedure, ask your health care provider about changing or stopping your regular medicines. This is especially important if you are taking diabetes medicines or blood thinners.  After the procedure, unless your health care provider tells you otherwise, increase your fluid intake. This will help flush the contrast dye from your body.  After the procedure, unless your health care provider tells you otherwise, you may return to your normal schedule, including diet, activities, and medicines. This information is not intended to replace advice given to you by your health care provider. Make sure you discuss any questions you have with your health care provider. Document Released: 10/04/2004 Document Revised: 02/23/2018 Document Reviewed: 02/23/2018 Elsevier Interactive Patient Education  2019 [employer].

## 2018-11-24 NOTE — Progress Notes (Signed)
Cardiology Office Note:    Date:  11/24/2018   ID:  Carol Love, DOB July 13, 1951, MRN [id number]  PCP:  Levin Erp, MD  Cardiologist:  Jenean Lindau, MD   Referring MD: Levin Erp, MD    ASSESSMENT:    1. Pre-operative cardiovascular examination   2. Coronary artery disease involving native coronary artery of native heart without angina pectoris   3. Essential hypertension   4. Hypercholesterolemia    PLAN:    In order of problems listed above:  1. Secondary prevention stressed with the patient.  Importance of compliance with diet and medication stressed and she vocalized understanding.  Her blood pressure is stable.  Her lipids are followed by her physicians. 2. Again as mentioned below is sedentary because of obvious reasons.  Her lower back gives her significant issues for which reason she minimizes ambulation.  She has known coronary artery disease.  She is asymptomatic at this time but I think it would be prudent to do a Lexiscan sestamibi stress test to have objective evidence of stability of her disease to stratify her for the surgery.  If the stress test is negative, then she is not at high risk for coronary events during the aforementioned surgery.  Meticulous hemodynamic monitoring will further reduce the risk of coronary events.   Medication Adjustments/Labs and Tests Ordered: Current medicines are reviewed at length with the patient today.  Concerns regarding medicines are outlined above.  No orders of the defined types were placed in this encounter.  No orders of the defined types were placed in this encounter.    No chief complaint on file.    History of Present Illness:    Carol Love is a 68 y.o. female.  Patient has past medical history of coronary artery disease.  Unaware of the details of her coronary intervention in the past.  This was several years ago.  She is now here for preop assessment and cardiovascular risk stratification.  This is for  her back surgery.  Unfortunately for the same reason she ambulates very minimally.  She denies any chest pain orthopnea or PND.  At the time of my evaluation, the patient is alert awake oriented and in no distress.  Past Medical History:  Diagnosis Date  . Anxiety   . Arthritis   . Constipation    uses stool softener with stimulant  . Coronary artery disease   . Depression   . Headache    last h/a  2000  . Hyperlipidemia   . Hypertension   . Irregular heart beat   . Myocardial infarction [facility]) 2014   stent    Past Surgical History:  Procedure Laterality Date  . APPENDECTOMY  05  . CARDIAC CATHETERIZATION  14   stent  . CARDIOVASCULAR STRESS TEST  03/07/15  . COLONOSCOPY    . CORONARY STENT PLACEMENT    . DILATION AND CURETTAGE OF UTERUS    . MAXIMUM ACCESS (MAS)POSTERIOR LUMBAR INTERBODY FUSION (PLIF) 1 LEVEL N/A 03/20/2015   Procedure: FOR MAXIMUM ACCESS (MAS) POSTERIOR LUMBAR INTERBODY FUSION (PLIF) 1 LEVEL;  Surgeon: Earnie Larsson, MD;  Location: [place] NEUR[facility] ORS;  Service: Neurosurgery;  Laterality: N/A;  FOR MAXIMUM ACCESS (MAS) POSTERIOR LUMBAR INTERBODY FUSION (PLIF) 1 LEVEL LUMBAR FOUR-FIVE  . NASAL SEPTUM SURGERY  1985   s/p fall  . SHOULDER ARTHROSCOPY Left 08  . TONSILLECTOMY      Current Medications: Current Meds  Medication Sig  . acebutolol (SECTRAL) 200 MG capsule  Take 1 capsule (200 mg total) by mouth daily.  Marland Kitchen ALPRAZolam (XANAX) 0.5 MG tablet Take 1 mg by mouth every evening.  Marland Kitchen amitriptyline (ELAVIL) 50 MG tablet Take 50 mg by mouth at bedtime.  Marland Kitchen aspirin 81 MG chewable tablet Chew 162 mg by mouth every evening.  . calcium citrate-vitamin D (CITRACAL+D) 315-200 MG-UNIT per tablet Take 1 tablet by mouth daily.   Marland Kitchen gabapentin (NEURONTIN) 300 MG capsule Take 1,200 mg by mouth at bedtime.   . meloxicam (MOBIC) 15 MG tablet Take 15 mg by mouth daily.   . Multiple Vitamin (MULTIVITAMIN) capsule Take 1 capsule by mouth daily.  . sertraline (ZOLOFT) 100 MG tablet Take  100 mg by mouth daily.  . traMADol (ULTRAM) 50 MG tablet Take 50-100 mg by mouth every 6 (six) hours as needed for moderate pain.  Marland Kitchen triamcinolone cream (KENALOG) 0.1 % Apply 1 application topically daily as needed (rash).   Current Facility-Administered Medications for the 11/24/18 encounter (Office Visit) with Brigido Mera, Reita Cliche, MD  Medication  . 0.9 %  sodium chloride infusion     Allergies:   Epinephrine; Ibuprofen; Penicillins; and Sulfa antibiotics   Social History   Socioeconomic History  . Marital status: Married    Spouse name: Not on file  . Number of children: Not on file  . Years of education: Not on file  . Highest education level: Not on file  Occupational History  . Not on file  Social Needs  . Financial resource strain: Not on file  . Food insecurity:    Worry: Not on file    Inability: Not on file  . Transportation needs:    Medical: Not on file    Non-medical: Not on file  Tobacco Use  . Smoking status: Former Smoker    Packs/day: 0.50    Years: 45.00    Pack years: 22.50    Types: Cigarettes    Last attempt to quit: 10/09/2012    Years since quitting: 6.1  . Smokeless tobacco: Never Used  Substance and Sexual Activity  . Alcohol use: Yes    Alcohol/week: 5.0 standard drinks    Types: 5 Glasses of wine per week    Comment: sev times a week- 1-2 glasses of wine   . Drug use: No  . Sexual activity: Not on file  Lifestyle  . Physical activity:    Days per week: Not on file    Minutes per session: Not on file  . Stress: Not on file  Relationships  . Social connections:    Talks on phone: Not on file    Gets together: Not on file    Attends religious service: Not on file    Active member of club or organization: Not on file    Attends meetings of clubs or organizations: Not on file    Relationship status: Not on file  Other Topics Concern  . Not on file  Social History Narrative  . Not on file     Family History: The patient's family history  includes AAA (abdominal aortic aneurysm) in her mother; Heart disease in her father and mother; Hyperlipidemia in her mother; Lung cancer in her mother. There is no history of Colon cancer, Colon polyps, Esophageal cancer, Rectal cancer, or Stomach cancer.  ROS:   Please see the history of present illness.    All other systems reviewed and are negative.  EKGs/Labs/Other Studies Reviewed:    The following studies were reviewed today: I discussed  my findings with the patient at extensive length.   Recent Labs: 11/19/2018: BUN 13; Creatinine, Ser 0.87; Hemoglobin 14.4; Platelets 207; Potassium 4.0; Sodium 139  Recent Lipid Panel    Component Value Date/Time   CHOL 151 06/18/2018 1003   TRIG 143 06/18/2018 1003   HDL 52 06/18/2018 1003   CHOLHDL 2.9 06/18/2018 1003   LDLCALC 70 06/18/2018 1003    Physical Exam:    VS:  BP 116/72 (BP Location: Right Arm, Patient Position: Sitting, Cuff Size: Normal)   Pulse 65   Ht 4' 11.5" (1.511 m)   Wt 143 lb 12.8 oz (65.2 kg)   SpO2 98%   BMI 28.56 kg/m     Wt Readings from Last 3 Encounters:  11/24/18 143 lb 12.8 oz (65.2 kg)  11/19/18 144 lb 4.8 oz (65.5 kg)  01/26/18 144 lb (65.3 kg)     GEN: Patient is in no acute distress HEENT: Normal NECK: No JVD; No carotid bruits LYMPHATICS: No lymphadenopathy CARDIAC: Hear sounds regular, 2/6 systolic murmur at the apex. RESPIRATORY:  Clear to auscultation without rales, wheezing or rhonchi  ABDOMEN: Soft, non-tender, non-distended MUSCULOSKELETAL:  No edema; No deformity  SKIN: Warm and dry NEUROLOGIC:  Alert and oriented x 3 PSYCHIATRIC:  Normal affect   Signed, Jenean Lindau, MD  11/24/2018 4:31 PM    [facility]

## 2018-11-25 ENCOUNTER — Telehealth (HOSPITAL_COMMUNITY): Payer: Self-pay

## 2018-11-25 NOTE — Telephone Encounter (Signed)
Encounter complete. 

## 2018-11-26 ENCOUNTER — Ambulatory Visit (HOSPITAL_COMMUNITY)
Admission: RE | Admit: 2018-11-26 | Discharge: 2018-11-26 | Disposition: A | Discharge status: Home / Self Care | Payer: Medicare Other | Source: Ambulatory Visit | Attending: Internal Medicine | Admitting: Internal Medicine

## 2018-11-26 ENCOUNTER — Telehealth: Payer: Self-pay | Admitting: Emergency Medicine

## 2018-11-26 DIAGNOSIS — I2510 Atherosclerotic heart disease of native coronary artery without angina pectoris: Secondary | ICD-10-CM

## 2018-11-26 DIAGNOSIS — Z01810 Encounter for preprocedural cardiovascular examination: Secondary | ICD-10-CM | POA: Insufficient documentation

## 2018-11-26 LAB — MYOCARDIAL PERFUSION IMAGING
CHL CUP NUCLEAR SDS: 4
LV dias vol: 77 mL (ref 46–106)
LV sys vol: 28 mL
Peak HR: 80 {beats}/min
Rest HR: 68 {beats}/min
SRS: 2
SSS: 6
TID: 1.28

## 2018-11-26 MED ORDER — TECHNETIUM TC 99M TETROFOSMIN IV KIT
32.0000 | PACK | Freq: Once | INTRAVENOUS | Status: AC | PRN
  Administered 2018-11-26: 32 via INTRAVENOUS
  Filled 2018-11-26: qty 32

## 2018-11-26 MED ORDER — TECHNETIUM TC 99M TETROFOSMIN IV KIT
10.2000 | PACK | Freq: Once | INTRAVENOUS | Status: AC | PRN
  Administered 2018-11-26: 10.2 via INTRAVENOUS
  Filled 2018-11-26: qty 11

## 2018-11-26 MED ORDER — REGADENOSON 0.4 MG/5ML IV SOLN
0.4000 mg | Freq: Once | INTRAVENOUS | Status: AC
  Administered 2018-11-26: 0.4 mg via INTRAVENOUS

## 2018-11-26 NOTE — Telephone Encounter (Signed)
Patient informed of abnormal stress test results and that she can't be cleared for surgery right now. Scheduled her to see Dr. Geraldo Pitter next week.

## 2018-11-27 ENCOUNTER — Encounter (HOSPITAL_COMMUNITY): Admission: RE | Payer: Self-pay | Source: Home / Self Care

## 2018-11-27 ENCOUNTER — Inpatient Hospital Stay (HOSPITAL_COMMUNITY): Admission: RE | Admit: 2018-11-27 | Payer: Medicare Other | Source: Home / Self Care | Admitting: Neurosurgery

## 2018-11-27 SURGERY — POSTERIOR LUMBAR FUSION 2 LEVEL
Anesthesia: General | Site: Back

## 2018-11-30 ENCOUNTER — Encounter: Payer: Self-pay | Admitting: Cardiology

## 2018-11-30 ENCOUNTER — Ambulatory Visit (HOSPITAL_COMMUNITY)
Admission: RE | Admit: 2018-11-30 | Discharge: 2018-11-30 | Disposition: A | Discharge status: Home / Self Care | Payer: Medicare Other | Source: Ambulatory Visit | Attending: Cardiology | Admitting: Cardiology

## 2018-11-30 ENCOUNTER — Encounter (HOSPITAL_COMMUNITY): Payer: Self-pay

## 2018-11-30 ENCOUNTER — Ambulatory Visit: Payer: Medicare Other | Admitting: Cardiology

## 2018-11-30 VITALS — BP 116/68 | HR 62 | Ht 60.0 in | Wt 143.0 lb

## 2018-11-30 DIAGNOSIS — E7800 Pure hypercholesterolemia, unspecified: Secondary | ICD-10-CM | POA: Diagnosis not present

## 2018-11-30 DIAGNOSIS — I10 Essential (primary) hypertension: Secondary | ICD-10-CM

## 2018-11-30 DIAGNOSIS — Z01810 Encounter for preprocedural cardiovascular examination: Secondary | ICD-10-CM

## 2018-11-30 DIAGNOSIS — I2510 Atherosclerotic heart disease of native coronary artery without angina pectoris: Secondary | ICD-10-CM | POA: Diagnosis not present

## 2018-11-30 MED ORDER — SODIUM CHLORIDE (PF) 0.9 % IJ SOLN
INTRAMUSCULAR | Status: AC
  Filled 2018-11-30: qty 50

## 2018-11-30 MED ORDER — METOPROLOL TARTRATE 50 MG PO TABS: 50.0000 mg | ORAL_TABLET | Freq: Two times a day (BID) | ORAL | 3 refills | Status: DC

## 2018-11-30 NOTE — Patient Instructions (Addendum)
Medication Instructions:  Your physician recommends that you continue on your current medications as directed. Please refer to the Current Medication list given to you today.  If you need a refill on your cardiac medications before your next appointment, please call your pharmacy.   Lab work: Your physician recommends that you have a hepatic function test and lipid panel drawn today.    If you have labs (blood work) drawn today and your tests are completely normal, you will receive your results only by: Marland Kitchen MyChart Message (if you have MyChart) OR . A paper copy in the mail If you have any lab test that is abnormal or we need to change your treatment, we will call you to review the results.  Testing/Procedures: Non-Cardiac CT Angiography (CTA), is a special type of CT scan that uses a computer to produce multi-dimensional views of major blood vessels throughout the body. In CT angiography, a contrast material is injected through an IV to help visualize the blood vessels  Please arrive at the [facility] main entrance of [facility] at xx:xx AM (30-45 minutes prior to test start time)  [facility] [place], [place] [address] [phone]  Proceed to the [facility] Radiology Department (First Floor).  Please follow these instructions carefully (unless otherwise directed):    On the Night Before the Test:  Be sure to Drink plenty of water.  Do not consume any caffeinated/decaffeinated beverages or chocolate 12 hours prior to your test.  Do not take any antihistamines 12 hours prior to your test.  If the patient has contrast allergy:  On the Day of the Test:  Drink plenty of water. Do not drink any water within one hour of the test.  Do not eat any food 4 hours prior to the test.  You may take your regular medications prior to the test.   Take metoprolol (Lopressor) two hours prior to test.        -If HR is less than 55 BPM- No  Beta Blocker                -IF HR is greater than 55 BPM and patient is less than or equal to 48 yrs old Lopressor 100mg  x1      After the Test:  Drink plenty of water.  After receiving IV contrast, you may experience a mild flushed feeling. This is normal.  On occasion, you may experience a mild rash up to 24 hours after the test. This is not dangerous. If this occurs, you can take Benadryl 25 mg and increase your fluid intake.  If you experience trouble breathing, this can be serious. If it is severe call 911 IMMEDIATELY. If it is mild, please call our office.  Follow-Up: At [facility], you and your health needs are our priority.  As part of our continuing mission to provide you with exceptional heart care, we have created designated Provider Care Teams.  These Care Teams include your primary Cardiologist (physician) and Advanced Practice Providers (APPs -  Physician Assistants and Nurse Practitioners) who all work together to provide you with the care you need, when you need it. You will need a follow up appointment in 6 months.     Any Other Special Instructions Will Be Listed Below   CT Angiogram  A CT angiogram is a procedure to look at the blood vessels in various areas of the body. For this procedure, a large X-ray machine, called a CT  scanner, takes detailed pictures of blood vessels that have been injected with a dye (contrast material). A CT angiogram allows your health care provider to see how well blood is flowing to the area of your body that is being checked. Your health care provider will be able to see if there are any problems, such as a blockage. Tell a health care provider about:  Any allergies you have.  All medicines you are taking, including vitamins, herbs, eye drops, creams, and over-the-counter medicines.  Any problems you or family members have had with anesthetic medicines.  Any blood disorders you have.  Any surgeries you have had.  Any medical  conditions you have.  Whether you are pregnant or may be pregnant.  Whether you are breastfeeding.  Any anxiety disorders, chronic pain, or other conditions you have that may increase your stress or prevent you from lying still. What are the risks? Generally, this is a safe procedure. However, problems may occur, including:  Infection.  Bleeding.  Allergic reactions to medicines or dyes.  Damage to other structures or organs.  Kidney damage from the dye or contrast that is used.  Increased risk of cancer from radiation exposure. This risk is low. Talk with your health care provider about: ? The risks and benefits of testing. ? How you can receive the lowest dose of radiation. What happens before the procedure?  Wear comfortable clothing and remove any jewelry.  Follow instructions from your health care provider about eating and drinking. For most people, instructions may include these actions: ? For 12 hours before the test, avoid caffeine. This includes tea, coffee, soda, and energy drinks or pills. ? For 3-4 hours before the test, stop eating or drinking anything but water. ? Stay well hydrated by continuing to drink water before the exam. This will help to clear the contrast dye from your body after the test.  Ask your health care provider about changing or stopping your regular medicines. This is especially important if you are taking diabetes medicines or blood thinners. What happens during the procedure?  An IV tube will be inserted into one of your veins.  You will be asked to lie on an exam table. This table will slide in and out of the CT machine during the procedure.  Contrast dye will be injected into the IV tube. You might feel warm, or you may get a metallic taste in your mouth.  The table that you are lying on will move into the CT machine tunnel for the scan.  The person running the machine will give you instructions while the scans are being done. You may be  asked to: ? Keep your arms above your head. ? Hold your breath. ? Stay very still, even if the table is moving.  When the scanning is complete, you will be moved out of the machine.  The IV tube will be removed. The procedure may vary among health care providers and hospitals. What happens after the procedure?  You might feel warm, or you may get a metallic taste in your mouth.  You may be asked to drink water or other fluids to wash (flush) the contrast material out of your body.  It is up to you to get the results of your procedure. Ask your health care provider, or the department that is doing the procedure, when your results will be ready. Summary  A CT angiogram is a procedure to look at the blood vessels in various areas of the  body.  You will need to stay very still during the exam.  You may be asked to drink water or other fluids to wash (flush) the contrast material out of your body after your scan. This information is not intended to replace advice given to you by your health care provider. Make sure you discuss any questions you have with your health care provider. Document Released: 05/09/2016 Document Revised: 05/09/2016 Document Reviewed: 05/09/2016 Elsevier Interactive Patient Education  [employer].

## 2018-11-30 NOTE — Addendum Note (Signed)
Addended by: Beckey Rutter on: 11/30/2018 02:30 PM   Modules accepted: Orders

## 2018-11-30 NOTE — Addendum Note (Signed)
Addended by: Tarri Glenn on: 11/30/2018 01:48 PM   Modules accepted: Orders

## 2018-11-30 NOTE — Progress Notes (Signed)
Cardiology Office Note:    Date:  11/30/2018   ID:  Carol Love, DOB April 25, 1951, MRN [id number]  PCP:  Levin Erp, MD  Cardiologist:  Jenean Lindau, MD   Referring MD: Levin Erp, MD    ASSESSMENT:    1. Pre-operative cardiovascular examination   2. Essential hypertension   3. Coronary artery disease involving native coronary artery of native heart without angina pectoris   4. Hypercholesterolemia    PLAN:    In order of problems listed above:  1. Secondary prevention stressed with her.  Importance of compliance with diet and medication stressed and she vocalized understanding.  Her blood pressure is stable. 2. I discussed findings of the stress test and discussed conventional coronary angiography and CT coronary angiography.  She prefers the latter and we will set her up for it.  Further recommendations will be made based on the findings of these test.  If this test is negative then she is not at high risk for coronary events during the aforementioned surgery.  Meticulous hemodynamic monitoring will further reduce the risk of coronary events.  She has had a stent to the right coronary artery.  These findings are applicable to what seems like the anterior segments of the heart predominantly supplied by the left anterior descending artery. 3. Patient will be seen in follow-up appointment in 6 months or earlier if the patient has any concerns    Medication Adjustments/Labs and Tests Ordered: Current medicines are reviewed at length with the patient today.  Concerns regarding medicines are outlined above.  No orders of the defined types were placed in this encounter.  No orders of the defined types were placed in this encounter.    No chief complaint on file.    History of Present Illness:    Carol Love is a 68 y.o. female.  Patient has past medical history of coronary artery disease and tells me that she is undergone right coronary artery stenting.  Subsequently  she was evaluated by me for preop reasons.  She has significant back issues for which she is contemplating surgery.  Her stress test was abnormal and therefore she is here.  Her stress test was thought to be possibly due to attenuation but ischemia could not be ruled out.  She denies any chest pain orthopnea or PND.  She is leads a sedentary lifestyle because of back issues.  At the time of my evaluation, the patient is alert awake oriented and in no distress.  Past Medical History:  Diagnosis Date  . Anxiety   . Arthritis   . Constipation    uses stool softener with stimulant  . Coronary artery disease   . Depression   . Headache    last h/a  2000  . Hyperlipidemia   . Hypertension   . Irregular heart beat   . Myocardial infarction [facility]) 2014   stent    Past Surgical History:  Procedure Laterality Date  . APPENDECTOMY  05  . CARDIAC CATHETERIZATION  14   stent  . CARDIOVASCULAR STRESS TEST  03/07/15  . COLONOSCOPY    . CORONARY STENT PLACEMENT    . DILATION AND CURETTAGE OF UTERUS    . MAXIMUM ACCESS (MAS)POSTERIOR LUMBAR INTERBODY FUSION (PLIF) 1 LEVEL N/A 03/20/2015   Procedure: FOR MAXIMUM ACCESS (MAS) POSTERIOR LUMBAR INTERBODY FUSION (PLIF) 1 LEVEL;  Surgeon: Earnie Larsson, MD;  Location: [place] NEUR[facility] ORS;  Service: Neurosurgery;  Laterality: N/A;  FOR MAXIMUM ACCESS (MAS) POSTERIOR LUMBAR  INTERBODY FUSION (PLIF) 1 LEVEL LUMBAR FOUR-FIVE  . NASAL SEPTUM SURGERY  1985   s/p fall  . SHOULDER ARTHROSCOPY Left 08  . TONSILLECTOMY      Current Medications: Current Meds  Medication Sig  . acebutolol (SECTRAL) 200 MG capsule Take 1 capsule (200 mg total) by mouth daily.  Marland Kitchen ALPRAZolam (XANAX) 0.5 MG tablet Take 1 mg by mouth every evening.  Marland Kitchen amitriptyline (ELAVIL) 50 MG tablet Take 50 mg by mouth at bedtime.  Marland Kitchen aspirin 81 MG chewable tablet Chew 162 mg by mouth every evening.  . calcium citrate-vitamin D (CITRACAL+D) 315-200 MG-UNIT per tablet Take 1 tablet by mouth daily.   Marland Kitchen  gabapentin (NEURONTIN) 300 MG capsule Take 1,200 mg by mouth at bedtime.   . meloxicam (MOBIC) 15 MG tablet Take 15 mg by mouth daily.   . Multiple Vitamin (MULTIVITAMIN) capsule Take 1 capsule by mouth daily.  . sertraline (ZOLOFT) 100 MG tablet Take 100 mg by mouth daily.  . traMADol (ULTRAM) 50 MG tablet Take 50-100 mg by mouth every 6 (six) hours as needed for moderate pain.   Current Facility-Administered Medications for the 11/30/18 encounter (Office Visit) with , Reita Cliche, MD  Medication  . 0.9 %  sodium chloride infusion     Allergies:   Epinephrine; Ibuprofen; Penicillins; and Sulfa antibiotics   Social History   Socioeconomic History  . Marital status: Married    Spouse name: Not on file  . Number of children: Not on file  . Years of education: Not on file  . Highest education level: Not on file  Occupational History  . Not on file  Social Needs  . Financial resource strain: Not on file  . Food insecurity:    Worry: Not on file    Inability: Not on file  . Transportation needs:    Medical: Not on file    Non-medical: Not on file  Tobacco Use  . Smoking status: Former Smoker    Packs/day: 0.50    Years: 45.00    Pack years: 22.50    Types: Cigarettes    Last attempt to quit: 10/09/2012    Years since quitting: 6.1  . Smokeless tobacco: Never Used  Substance and Sexual Activity  . Alcohol use: Yes    Alcohol/week: 5.0 standard drinks    Types: 5 Glasses of wine per week    Comment: sev times a week- 1-2 glasses of wine   . Drug use: No  . Sexual activity: Not on file  Lifestyle  . Physical activity:    Days per week: Not on file    Minutes per session: Not on file  . Stress: Not on file  Relationships  . Social connections:    Talks on phone: Not on file    Gets together: Not on file    Attends religious service: Not on file    Active member of club or organization: Not on file    Attends meetings of clubs or organizations: Not on file     Relationship status: Not on file  Other Topics Concern  . Not on file  Social History Narrative  . Not on file     Family History: The patient's family history includes AAA (abdominal aortic aneurysm) in her mother; Heart disease in her father and mother; Hyperlipidemia in her mother; Lung cancer in her mother. There is no history of Colon cancer, Colon polyps, Esophageal cancer, Rectal cancer, or Stomach cancer.  ROS:   Please  see the history of present illness.    All other systems reviewed and are negative.  EKGs/Labs/Other Studies Reviewed:    The following studies were reviewed today: I discussed my findings with the patient at extensive length.  Stress test report was detailed with her at length.   Recent Labs: 11/19/2018: BUN 13; Creatinine, Ser 0.87; Hemoglobin 14.4; Platelets 207; Potassium 4.0; Sodium 139  Recent Lipid Panel    Component Value Date/Time   CHOL 151 06/18/2018 1003   TRIG 143 06/18/2018 1003   HDL 52 06/18/2018 1003   CHOLHDL 2.9 06/18/2018 1003   LDLCALC 70 06/18/2018 1003    Physical Exam:    VS:  BP 116/68 (BP Location: Right Arm, Patient Position: Sitting, Cuff Size: Normal)   Pulse 62   Ht 5' (1.524 m)   Wt 143 lb (64.9 kg)   SpO2 96%   BMI 27.93 kg/m     Wt Readings from Last 3 Encounters:  11/30/18 143 lb (64.9 kg)  11/26/18 143 lb (64.9 kg)  11/24/18 143 lb 12.8 oz (65.2 kg)     GEN: Patient is in no acute distress HEENT: Normal NECK: No JVD; No carotid bruits LYMPHATICS: No lymphadenopathy CARDIAC: Hear sounds regular, 2/6 systolic murmur at the apex. RESPIRATORY:  Clear to auscultation without rales, wheezing or rhonchi  ABDOMEN: Soft, non-tender, non-distended MUSCULOSKELETAL:  No edema; No deformity  SKIN: Warm and dry NEUROLOGIC:  Alert and oriented x 3 PSYCHIATRIC:  Normal affect   Signed, Jenean Lindau, MD  11/30/2018 12:02 PM    [facility]

## 2018-11-30 NOTE — Addendum Note (Signed)
Addended by: Beckey Rutter on: 11/30/2018 12:40 PM   Modules accepted: Orders

## 2018-12-01 ENCOUNTER — Telehealth (HOSPITAL_COMMUNITY): Payer: Self-pay | Admitting: Emergency Medicine

## 2018-12-01 ENCOUNTER — Telehealth: Payer: Self-pay

## 2018-12-01 LAB — LIPID PANEL
Chol/HDL Ratio: 3.3 ratio (ref 0.0–4.4)
Cholesterol, Total: 163 mg/dL (ref 100–199)
HDL: 49 mg/dL (ref >39)
LDL Calculated: 64 mg/dL (ref 0–99)
TRIGLYCERIDES: 250 mg/dL — AB (ref 0–149)
VLDL Cholesterol Cal: 50 mg/dL — ABNORMAL HIGH (ref 5–40)

## 2018-12-01 LAB — HEPATIC FUNCTION PANEL
ALT: 39 IU/L — ABNORMAL HIGH (ref 0–32)
AST: 40 IU/L (ref 0–40)
Albumin: 4.7 g/dL (ref 3.8–4.8)
Alkaline Phosphatase: 81 IU/L (ref 39–117)
Bilirubin Total: 0.6 mg/dL (ref 0.0–1.2)
Bilirubin, Direct: 0.14 mg/dL (ref 0.00–0.40)
Total Protein: 6.6 g/dL (ref 6.0–8.5)

## 2018-12-01 MED ORDER — METOPROLOL TARTRATE 50 MG PO TABS: ORAL_TABLET | ORAL | 0 refills | Status: DC

## 2018-12-01 NOTE — Telephone Encounter (Signed)
-----   Message from Jenean Lindau, MD sent at 12/01/2018  3:36 PM EDT ----- The results of the study is unremarkable. Please inform patient. I will discuss in detail at next appointment. Cc  primary care/referring physician Jenean Lindau, MD 12/01/2018 3:36 PM

## 2018-12-01 NOTE — Telephone Encounter (Signed)
Reaching out to patient to offer assistance regarding upcoming cardiac imaging study; pt verbalizes understanding of appt date/time, parking situation and where to check in, pre-test NPO status and medications ordered, and verified current allergies; name and call back number provided for further questions should they arise Marchia Bond RN [place] [phone] office [phone] cell  Pt given strict instructions regarding use of metoprolol prior to scan, patient verbalized understanding

## 2018-12-01 NOTE — Addendum Note (Signed)
Addended by: Beckey Rutter on: 12/01/2018 01:40 PM   Modules accepted: Orders

## 2018-12-01 NOTE — Telephone Encounter (Signed)
Patient called and notified of test results. 

## 2018-12-03 ENCOUNTER — Ambulatory Visit (HOSPITAL_COMMUNITY)
Admission: RE | Admit: 2018-12-03 | Discharge: 2018-12-03 | Disposition: A | Discharge status: Home / Self Care | Payer: Medicare Other | Source: Ambulatory Visit | Attending: Cardiology | Admitting: Cardiology

## 2018-12-03 ENCOUNTER — Encounter (HOSPITAL_COMMUNITY): Payer: Self-pay

## 2018-12-03 ENCOUNTER — Other Ambulatory Visit: Payer: Self-pay

## 2018-12-03 DIAGNOSIS — R9430 Abnormal result of cardiovascular function study, unspecified: Secondary | ICD-10-CM | POA: Diagnosis not present

## 2018-12-03 DIAGNOSIS — Z01810 Encounter for preprocedural cardiovascular examination: Secondary | ICD-10-CM | POA: Insufficient documentation

## 2018-12-03 DIAGNOSIS — I2510 Atherosclerotic heart disease of native coronary artery without angina pectoris: Secondary | ICD-10-CM | POA: Diagnosis not present

## 2018-12-03 MED ORDER — IOHEXOL 350 MG/ML SOLN
100.0000 mL | Freq: Once | INTRAVENOUS | Status: AC | PRN
  Administered 2018-12-03: 100 mL via INTRAVENOUS

## 2018-12-03 MED ORDER — NITROGLYCERIN 0.4 MG SL SUBL
SUBLINGUAL_TABLET | SUBLINGUAL | Status: AC
  Filled 2018-12-03: qty 2

## 2018-12-03 MED ORDER — NITROGLYCERIN 0.4 MG SL SUBL
0.8000 mg | SUBLINGUAL_TABLET | Freq: Once | SUBLINGUAL | Status: AC
  Administered 2018-12-03: 0.8 mg via SUBLINGUAL
  Filled 2018-12-03: qty 25

## 2018-12-03 NOTE — Progress Notes (Signed)
CT complete. Patient denies any complaints. Patient offered snack and beverage.  

## 2018-12-03 NOTE — Progress Notes (Signed)
CT complete. Patient denies any complaints. Patient ambulatory out of department with steady gait.

## 2018-12-07 ENCOUNTER — Telehealth: Payer: Self-pay | Admitting: Cardiology

## 2018-12-07 ENCOUNTER — Telehealth: Payer: Self-pay

## 2018-12-07 ENCOUNTER — Ambulatory Visit: Payer: Medicare Other | Admitting: Cardiology

## 2018-12-07 NOTE — Telephone Encounter (Signed)
Patient is calling back again regarding her heart CT results

## 2018-12-07 NOTE — Telephone Encounter (Signed)
No FFR available for 12/03/18 Ct coronary. Patient calling for results. RN contacted imaging at 920 am with no response. Imaging supervisor cc'd to verify if there is a FFR available from testing.

## 2018-12-07 NOTE — Telephone Encounter (Signed)
Please review patients CT ANGIO CHEST AORTA W &/OR WO CONTRAST results and advise. She has called twice wanting update.

## 2018-12-08 ENCOUNTER — Encounter: Payer: Self-pay | Admitting: Cardiology

## 2018-12-08 ENCOUNTER — Ambulatory Visit: Payer: Medicare Other | Admitting: Cardiology

## 2018-12-08 ENCOUNTER — Other Ambulatory Visit: Payer: Self-pay

## 2018-12-08 VITALS — BP 122/82 | HR 77 | Ht 60.0 in | Wt 144.0 lb

## 2018-12-08 DIAGNOSIS — I10 Essential (primary) hypertension: Secondary | ICD-10-CM | POA: Diagnosis not present

## 2018-12-08 DIAGNOSIS — R9439 Abnormal result of other cardiovascular function study: Secondary | ICD-10-CM

## 2018-12-08 DIAGNOSIS — I2510 Atherosclerotic heart disease of native coronary artery without angina pectoris: Secondary | ICD-10-CM

## 2018-12-08 DIAGNOSIS — E7800 Pure hypercholesterolemia, unspecified: Secondary | ICD-10-CM | POA: Diagnosis not present

## 2018-12-08 DIAGNOSIS — Z01810 Encounter for preprocedural cardiovascular examination: Secondary | ICD-10-CM

## 2018-12-08 HISTORY — DX: Abnormal result of other cardiovascular function study: R94.39

## 2018-12-08 NOTE — Progress Notes (Signed)
Cardiology Office Note:    Date:  12/08/2018   ID:  [name], DOB April 02, 1951, MRN [id number]  PCP:  Levin Erp, MD  Cardiologist:  Jenean Lindau, MD   Referring MD: Levin Erp, MD    ASSESSMENT:    1. Abnormal nuclear stress test   2. Coronary artery disease involving native coronary artery of native heart without angina pectoris   3. Essential hypertension   4. Hypercholesterolemia   5. Pre-operative cardiovascular examination    PLAN:    In order of problems listed above:  1. Secondary prevention stressed with the patient.  Importance of compliance with diet and medication stressed.  I discussed the findings CT coronary angiography with her at extensive length.  These findings are very concerning and the following recommendations were made 2. I discussed coronary angiography and left heart catheterization with the patient at extensive length. Procedure, benefits and potential risks were explained. Patient had multiple questions which were answered to the patient's satisfaction. Patient agreed and consented for the procedure. Further recommendations will be made based on the findings of the coronary angiography. In the interim. The patient has any significant symptoms he knows to go to the nearest emergency room.    Medication Adjustments/Labs and Tests Ordered: Current medicines are reviewed at length with the patient today.  Concerns regarding medicines are outlined above.  No orders of the defined types were placed in this encounter.  No orders of the defined types were placed in this encounter.    No chief complaint on file.    History of Present Illness:    Carol Love is a 68 y.o. female.  Patient has known coronary she was evaluated for preop assessment.  Patient had abnormal stress test and underwent CT coronary angiography which was significantly abnormal and revealed proximal LAD stenosis.  Patient was called for follow-up appointment.  She leads  a sedentary lifestyle because of orthopedic issues involving her lower back.  Her husband accompanies her for this visit.  Again she denies any chest pain orthopnea PND but leads a very sedentary lifestyle.  Past Medical History:  Diagnosis Date  . Anxiety   . Arthritis   . Constipation    uses stool softener with stimulant  . Coronary artery disease   . Depression   . Headache    last h/a  2000  . Hyperlipidemia   . Hypertension   . Irregular heart beat   . Myocardial infarction [facility]) 2014   stent    Past Surgical History:  Procedure Laterality Date  . APPENDECTOMY  05  . CARDIAC CATHETERIZATION  14   stent  . CARDIOVASCULAR STRESS TEST  03/07/15  . COLONOSCOPY    . CORONARY STENT PLACEMENT    . DILATION AND CURETTAGE OF UTERUS    . MAXIMUM ACCESS (MAS)POSTERIOR LUMBAR INTERBODY FUSION (PLIF) 1 LEVEL N/A 03/20/2015   Procedure: FOR MAXIMUM ACCESS (MAS) POSTERIOR LUMBAR INTERBODY FUSION (PLIF) 1 LEVEL;  Surgeon: Earnie Larsson, MD;  Location: [place] [facility];  Service: Neurosurgery;  Laterality: N/A;  FOR MAXIMUM ACCESS (MAS) POSTERIOR LUMBAR INTERBODY FUSION (PLIF) 1 LEVEL LUMBAR FOUR-FIVE  . NASAL SEPTUM SURGERY  1985   s/p fall  . SHOULDER ARTHROSCOPY Left 08  . TONSILLECTOMY      Current Medications: Current Meds  Medication Sig  . acebutolol (SECTRAL) 200 MG capsule Take 1 capsule (200 mg total) by mouth daily.  Marland Kitchen ALPRAZolam (XANAX) 0.5 MG tablet Take 1 mg by mouth every evening.  Marland Kitchen  amitriptyline (ELAVIL) 50 MG tablet Take 50 mg by mouth at bedtime.  Marland Kitchen aspirin 81 MG chewable tablet Chew 162 mg by mouth every evening.  . calcium citrate-vitamin D (CITRACAL+D) 315-200 MG-UNIT per tablet Take 1 tablet by mouth daily.   . cyclobenzaprine (FLEXERIL) 10 MG tablet Take 10 mg by mouth 3 (three) times daily as needed for muscle spasms.  Marland Kitchen gabapentin (NEURONTIN) 300 MG capsule Take 1,200 mg by mouth at bedtime.   . meloxicam (MOBIC) 15 MG tablet Take 15 mg by mouth daily.   .  metoprolol tartrate (LOPRESSOR) 50 MG tablet Take 2 tablets by mouth 2 hours prior to procedure if HR >55  . Multiple Vitamin (MULTIVITAMIN) capsule Take 1 capsule by mouth daily.  . sertraline (ZOLOFT) 100 MG tablet Take 100 mg by mouth daily.  . traMADol (ULTRAM) 50 MG tablet Take 50-100 mg by mouth every 6 (six) hours as needed for moderate pain.   Current Facility-Administered Medications for the 12/08/18 encounter (Office Visit) with Jalayia Bagheri, Reita Cliche, MD  Medication  . 0.9 %  sodium chloride infusion     Allergies:   Epinephrine; Ibuprofen; Penicillins; and Sulfa antibiotics   Social History   Socioeconomic History  . Marital status: Married    Spouse name: Not on file  . Number of children: Not on file  . Years of education: Not on file  . Highest education level: Not on file  Occupational History  . Not on file  Social Needs  . Financial resource strain: Not on file  . Food insecurity:    Worry: Not on file    Inability: Not on file  . Transportation needs:    Medical: Not on file    Non-medical: Not on file  Tobacco Use  . Smoking status: Former Smoker    Packs/day: 0.50    Years: 45.00    Pack years: 22.50    Types: Cigarettes    Last attempt to quit: 10/09/2012    Years since quitting: 6.1  . Smokeless tobacco: Never Used  Substance and Sexual Activity  . Alcohol use: Yes    Alcohol/week: 5.0 standard drinks    Types: 5 Glasses of wine per week    Comment: sev times a week- 1-2 glasses of wine   . Drug use: No  . Sexual activity: Not on file  Lifestyle  . Physical activity:    Days per week: Not on file    Minutes per session: Not on file  . Stress: Not on file  Relationships  . Social connections:    Talks on phone: Not on file    Gets together: Not on file    Attends religious service: Not on file    Active member of club or organization: Not on file    Attends meetings of clubs or organizations: Not on file    Relationship status: Not on file   Other Topics Concern  . Not on file  Social History Narrative  . Not on file     Family History: The patient's family history includes AAA (abdominal aortic aneurysm) in her mother; Heart disease in her father and mother; Hyperlipidemia in her mother; Lung cancer in her mother. There is no history of Colon cancer, Colon polyps, Esophageal cancer, Rectal cancer, or Stomach cancer.  ROS:   Please see the history of present illness.    All other systems reviewed and are negative.  EKGs/Labs/Other Studies Reviewed:    The following studies were reviewed today:  IMPRESSION: 1. Severe CAD, CADRADS = 4V. Severe proximal LAD stenosis with features of positive remodeling, severe mid LAD stenosis. 70-99% stenosis. CT FFR anaylsis will be performed and reported separately.  2. The patient's coronary artery calcium score is 1078, which places the patient in the 54 percentile for age and sex matched control.  3. Normal coronary origin with right dominance.  4. Patent foramen ovale with left to right shunt.   Electronically Signed   By: Cherlynn Kaiser   On: 12/07/2018 17:20   Recent Labs: 11/19/2018: BUN 13; Creatinine, Ser 0.87; Hemoglobin 14.4; Platelets 207; Potassium 4.0; Sodium 139 11/30/2018: ALT 39  Recent Lipid Panel    Component Value Date/Time   CHOL 163 11/30/2018 1231   TRIG 250 (H) 11/30/2018 1231   HDL 49 11/30/2018 1231   CHOLHDL 3.3 11/30/2018 1231   LDLCALC 64 11/30/2018 1231    Physical Exam:    VS:  BP 122/82 (BP Location: Right Arm, Patient Position: Sitting, Cuff Size: Normal)   Pulse 77   Ht 5' (1.524 m)   Wt 144 lb (65.3 kg)   SpO2 98%   BMI 28.12 kg/m     Wt Readings from Last 3 Encounters:  12/08/18 144 lb (65.3 kg)  11/30/18 143 lb (64.9 kg)  11/26/18 143 lb (64.9 kg)     GEN: Patient is in no acute distress HEENT: Normal NECK: No JVD; No carotid bruits LYMPHATICS: No lymphadenopathy CARDIAC: Hear sounds regular, 2/6 systolic  murmur at the apex. RESPIRATORY:  Clear to auscultation without rales, wheezing or rhonchi  ABDOMEN: Soft, non-tender, non-distended MUSCULOSKELETAL:  No edema; No deformity  SKIN: Warm and dry NEUROLOGIC:  Alert and oriented x 3 PSYCHIATRIC:  Normal affect   Signed, Jenean Lindau, MD  12/08/2018 11:31 AM    [place]

## 2018-12-08 NOTE — H&P (View-Only) (Signed)
Cardiology Office Note:    Date:  12/08/2018   ID:  Carol Love, DOB 1951-05-30, MRN [id number]  PCP:  Levin Erp, MD  Cardiologist:  Jenean Lindau, MD   Referring MD: Levin Erp, MD    ASSESSMENT:    1. Abnormal nuclear stress test   2. Coronary artery disease involving native coronary artery of native heart without angina pectoris   3. Essential hypertension   4. Hypercholesterolemia   5. Pre-operative cardiovascular examination    PLAN:    In order of problems listed above:  1. Secondary prevention stressed with the patient.  Importance of compliance with diet and medication stressed.  I discussed the findings CT coronary angiography with her at extensive length.  These findings are very concerning and the following recommendations were made 2. I discussed coronary angiography and left heart catheterization with the patient at extensive length. Procedure, benefits and potential risks were explained. Patient had multiple questions which were answered to the patient's satisfaction. Patient agreed and consented for the procedure. Further recommendations will be made based on the findings of the coronary angiography. In the interim. The patient has any significant symptoms he knows to go to the nearest emergency room.    Medication Adjustments/Labs and Tests Ordered: Current medicines are reviewed at length with the patient today.  Concerns regarding medicines are outlined above.  No orders of the defined types were placed in this encounter.  No orders of the defined types were placed in this encounter.    No chief complaint on file.    History of Present Illness:    Carol Love is a 68 y.o. female.  Patient has known coronary she was evaluated for preop assessment.  Patient had abnormal stress test and underwent CT coronary angiography which was significantly abnormal and revealed proximal LAD stenosis.  Patient was called for follow-up appointment.  She leads  a sedentary lifestyle because of orthopedic issues involving her lower back.  Her husband accompanies her for this visit.  Again she denies any chest pain orthopnea PND but leads a very sedentary lifestyle.  Past Medical History:  Diagnosis Date  . Anxiety   . Arthritis   . Constipation    uses stool softener with stimulant  . Coronary artery disease   . Depression   . Headache    last h/a  2000  . Hyperlipidemia   . Hypertension   . Irregular heart beat   . Myocardial infarction [facility]) 2014   stent    Past Surgical History:  Procedure Laterality Date  . APPENDECTOMY  05  . CARDIAC CATHETERIZATION  14   stent  . CARDIOVASCULAR STRESS TEST  03/07/15  . COLONOSCOPY    . CORONARY STENT PLACEMENT    . DILATION AND CURETTAGE OF UTERUS    . MAXIMUM ACCESS (MAS)POSTERIOR LUMBAR INTERBODY FUSION (PLIF) 1 LEVEL N/A 03/20/2015   Procedure: FOR MAXIMUM ACCESS (MAS) POSTERIOR LUMBAR INTERBODY FUSION (PLIF) 1 LEVEL;  Surgeon: Earnie Larsson, MD;  Location: [place] [facility];  Service: Neurosurgery;  Laterality: N/A;  FOR MAXIMUM ACCESS (MAS) POSTERIOR LUMBAR INTERBODY FUSION (PLIF) 1 LEVEL LUMBAR FOUR-FIVE  . NASAL SEPTUM SURGERY  1985   s/p fall  . SHOULDER ARTHROSCOPY Left 08  . TONSILLECTOMY      Current Medications: Current Meds  Medication Sig  . acebutolol (SECTRAL) 200 MG capsule Take 1 capsule (200 mg total) by mouth daily.  Marland Kitchen ALPRAZolam (XANAX) 0.5 MG tablet Take 1 mg by mouth every evening.  Marland Kitchen  amitriptyline (ELAVIL) 50 MG tablet Take 50 mg by mouth at bedtime.  Marland Kitchen aspirin 81 MG chewable tablet Chew 162 mg by mouth every evening.  . calcium citrate-vitamin D (CITRACAL+D) 315-200 MG-UNIT per tablet Take 1 tablet by mouth daily.   . cyclobenzaprine (FLEXERIL) 10 MG tablet Take 10 mg by mouth 3 (three) times daily as needed for muscle spasms.  Marland Kitchen gabapentin (NEURONTIN) 300 MG capsule Take 1,200 mg by mouth at bedtime.   . meloxicam (MOBIC) 15 MG tablet Take 15 mg by mouth daily.   .  metoprolol tartrate (LOPRESSOR) 50 MG tablet Take 2 tablets by mouth 2 hours prior to procedure if HR >55  . Multiple Vitamin (MULTIVITAMIN) capsule Take 1 capsule by mouth daily.  . sertraline (ZOLOFT) 100 MG tablet Take 100 mg by mouth daily.  . traMADol (ULTRAM) 50 MG tablet Take 50-100 mg by mouth every 6 (six) hours as needed for moderate pain.   Current Facility-Administered Medications for the 12/08/18 encounter (Office Visit) with Rodolfo Gaster, Reita Cliche, MD  Medication  . 0.9 %  sodium chloride infusion     Allergies:   Epinephrine; Ibuprofen; Penicillins; and Sulfa antibiotics   Social History   Socioeconomic History  . Marital status: Married    Spouse name: Not on file  . Number of children: Not on file  . Years of education: Not on file  . Highest education level: Not on file  Occupational History  . Not on file  Social Needs  . Financial resource strain: Not on file  . Food insecurity:    Worry: Not on file    Inability: Not on file  . Transportation needs:    Medical: Not on file    Non-medical: Not on file  Tobacco Use  . Smoking status: Former Smoker    Packs/day: 0.50    Years: 45.00    Pack years: 22.50    Types: Cigarettes    Last attempt to quit: 10/09/2012    Years since quitting: 6.1  . Smokeless tobacco: Never Used  Substance and Sexual Activity  . Alcohol use: Yes    Alcohol/week: 5.0 standard drinks    Types: 5 Glasses of wine per week    Comment: sev times a week- 1-2 glasses of wine   . Drug use: No  . Sexual activity: Not on file  Lifestyle  . Physical activity:    Days per week: Not on file    Minutes per session: Not on file  . Stress: Not on file  Relationships  . Social connections:    Talks on phone: Not on file    Gets together: Not on file    Attends religious service: Not on file    Active member of club or organization: Not on file    Attends meetings of clubs or organizations: Not on file    Relationship status: Not on file   Other Topics Concern  . Not on file  Social History Narrative  . Not on file     Family History: The patient's family history includes AAA (abdominal aortic aneurysm) in her mother; Heart disease in her father and mother; Hyperlipidemia in her mother; Lung cancer in her mother. There is no history of Colon cancer, Colon polyps, Esophageal cancer, Rectal cancer, or Stomach cancer.  ROS:   Please see the history of present illness.    All other systems reviewed and are negative.  EKGs/Labs/Other Studies Reviewed:    The following studies were reviewed today:  IMPRESSION: 1. Severe CAD, CADRADS = 4V. Severe proximal LAD stenosis with features of positive remodeling, severe mid LAD stenosis. 70-99% stenosis. CT FFR anaylsis will be performed and reported separately.  2. The patient's coronary artery calcium score is 1078, which places the patient in the 67 percentile for age and sex matched control.  3. Normal coronary origin with right dominance.  4. Patent foramen ovale with left to right shunt.   Electronically Signed   By: Cherlynn Kaiser   On: 12/07/2018 17:20   Recent Labs: 11/19/2018: BUN 13; Creatinine, Ser 0.87; Hemoglobin 14.4; Platelets 207; Potassium 4.0; Sodium 139 11/30/2018: ALT 39  Recent Lipid Panel    Component Value Date/Time   CHOL 163 11/30/2018 1231   TRIG 250 (H) 11/30/2018 1231   HDL 49 11/30/2018 1231   CHOLHDL 3.3 11/30/2018 1231   LDLCALC 64 11/30/2018 1231    Physical Exam:    VS:  BP 122/82 (BP Location: Right Arm, Patient Position: Sitting, Cuff Size: Normal)   Pulse 77   Ht 5' (1.524 m)   Wt 144 lb (65.3 kg)   SpO2 98%   BMI 28.12 kg/m     Wt Readings from Last 3 Encounters:  12/08/18 144 lb (65.3 kg)  11/30/18 143 lb (64.9 kg)  11/26/18 143 lb (64.9 kg)     GEN: Patient is in no acute distress HEENT: Normal NECK: No JVD; No carotid bruits LYMPHATICS: No lymphadenopathy CARDIAC: Hear sounds regular, 2/6 systolic  murmur at the apex. RESPIRATORY:  Clear to auscultation without rales, wheezing or rhonchi  ABDOMEN: Soft, non-tender, non-distended MUSCULOSKELETAL:  No edema; No deformity  SKIN: Warm and dry NEUROLOGIC:  Alert and oriented x 3 PSYCHIATRIC:  Normal affect   Signed, Jenean Lindau, MD  12/08/2018 11:31 AM    [place]

## 2018-12-08 NOTE — Patient Instructions (Signed)
Medication Instructions:  Your physician recommends that you continue on your current medications as directed. Please refer to the Current Medication list given to you today.  If you need a refill on your cardiac medications before your next appointment, please call your pharmacy.   Lab work: NONE   If you have labs (blood work) drawn today and your tests are completely normal, you will receive your results only by: Marland Kitchen MyChart Message (if you have MyChart) OR . A paper copy in the mail If you have any lab test that is abnormal or we need to change your treatment, we will call you to review the results.  Testing/Procedures: A chest x-ray takes a picture of the organs and structures inside the chest, including the heart, lungs, and blood vessels. This test can show several things, including, whether the heart is enlarges; whether fluid is building up in the lungs; and whether pacemaker / defibrillator leads are still in place.   Follow-Up: At [facility], you and your health needs are our priority.  As part of our continuing mission to provide you with exceptional heart care, we have created designated Provider Care Teams.  These Care Teams include your primary Cardiologist (physician) and Advanced Practice Providers (APPs -  Physician Assistants and Nurse Practitioners) who all work together to provide you with the care you need, when you need it. You will need a follow up appointment in 2 months.    Any Other Special Instructions Will Be Listed Below   Coronary Angiogram A coronary angiogram is an X-ray procedure that is used to examine the arteries in the heart. In this procedure, a dye (contrast dye) is injected through a long, thin tube (catheter). The catheter is inserted through the groin, wrist, or arm. The dye is injected into each artery, then X-rays are taken to show if there is a blockage in the arteries of the heart. This procedure can also show if you have valve disease or a  disease of the aorta, and it can be used to check the overall function of your heart muscle. You may have a coronary angiogram if:  You are having chest pain, or other symptoms of angina, and you are at risk for heart disease.  You have an abnormal electrocardiogram (ECG) or stress test.  You have chest pain and heart failure.  You are having irregular heart rhythms.  You and your health care provider determine that the benefits of the test information outweigh the risks of the procedure. Let your health care provider know about:  Any allergies you have, including allergies to contrast dye.  All medicines you are taking, including vitamins, herbs, eye drops, creams, and over-the-counter medicines.  Any problems you or family members have had with anesthetic medicines.  Any blood disorders you have.  Any surgeries you have had.  History of kidney problems or kidney failure.  Any medical conditions you have.  Whether you are pregnant or may be pregnant. What are the risks? Generally, this is a safe procedure. However, problems may occur, including:  Infection.  Allergic reaction to medicines or dyes that are used.  Bleeding from the access site or other locations.  Kidney injury, especially in people with impaired kidney function.  Stroke (rare).  Heart attack (rare).  Damage to other structures or organs. What happens before the procedure? Staying hydrated Follow instructions from your health care provider about hydration, which may include:  Up to 2 hours before the procedure - you may continue  to drink clear liquids, such as water, clear fruit juice, black coffee, and plain tea. Eating and drinking restrictions Follow instructions from your health care provider about eating and drinking, which may include:  8 hours before the procedure - stop eating heavy meals or foods such as meat, fried foods, or fatty foods.  6 hours before the procedure - stop eating light  meals or foods, such as toast or cereal.  2 hours before the procedure - stop drinking clear liquids. General instructions  Ask your health care provider about: ? Changing or stopping your regular medicines. This is especially important if you are taking diabetes medicines or blood thinners. ? Taking medicines such as ibuprofen. These medicines can thin your blood. Do not take these medicines before your procedure if your health care provider instructs you not to, though aspirin may be recommended prior to coronary angiograms.  Plan to have someone take you home from the hospital or clinic.  You may need to have blood tests or X-rays done. What happens during the procedure?  An IV tube will be inserted into one of your veins.  You will be given one or more of the following: ? A medicine to help you relax (sedative). ? A medicine to numb the area where the catheter will be inserted into an artery (local anesthetic).  To reduce your risk of infection: ? Your health care team will wash or sanitize their hands. ? Your skin will be washed with soap. ? Hair may be removed from the area where the catheter will be inserted.  You will be connected to a continuous ECG monitor.  The catheter will be inserted into an artery. The location may be in your groin, in your wrist, or in the fold of your arm (near your elbow).  A type of X-ray (fluoroscopy) will be used to help guide the catheter to the opening of the blood vessel that is being examined.  A dye will be injected into the catheter, and X-rays will be taken. The dye will help to show where any narrowing or blockages are located in the heart arteries.  Tell your health care provider if you have any chest pain or trouble breathing during the procedure.  If blockages are found, your health care provider may perform another procedure, such as inserting a coronary stent. The procedure may vary among health care providers and hospitals. What  happens after the procedure?  After the procedure, you will need to keep the area still for a few hours, or for as long as told by your health care provider. If the procedure is done through the groin, you will be instructed to not bend and not cross your legs.  The insertion site will be checked frequently.  The pulse in your foot or wrist will be checked frequently.  You may have additional blood tests, X-rays, and a test that records the electrical activity of your heart (ECG).  Do not drive for 24 hours if you were given a sedative. Summary  A coronary angiogram is an X-ray procedure that is used to look into the arteries in the heart.  During the procedure, a dye (contrast dye) is injected through a long, thin tube (catheter). The catheter is inserted through the groin, wrist, or arm.  Tell your health care provider about any allergies you have, including allergies to contrast dye.  After the procedure, you will need to keep the area still for a few hours, or for as long as  told by your health care provider. This information is not intended to replace advice given to you by your health care provider. Make sure you discuss any questions you have with your health care provider. Document Released: 03/16/2003 Document Revised: 06/21/2016 Document Reviewed: 06/21/2016 Elsevier Interactive Patient Education  2019 [place] [id number] Dept: [id number] Loc: [facility]  12/08/2018  You are scheduled for a Cardiac Catheterization on Thursday, March 19 with Dr. Daneen Schick.  1. Please arrive at the [facility] (Main Entrance A) at [facility]: [address] [place], [place] [address] at 7:00 AM (This time is two hours before your procedure to ensure your preparation). Free valet parking service is available.   Special note: Every  effort is made to have your procedure done on time. Please understand that emergencies sometimes delay scheduled procedures.  2. Diet: Do not eat solid foods after midnight.  The patient may have clear liquids until 5am upon the day of the procedure.  3. Labs: NONE  4. Medication instructions in preparation for your procedure:     Stop taking ACEBUTOLOL 24 hours prior to procedure   On the morning of your procedure, take your Aspirin and any morning medicines NOT listed above.  You may use sips of water.  5. Plan for one night stay--bring personal belongings. 6. Bring a current list of your medications and current insurance cards. 7. You MUST have a responsible person to drive you home. 8. Someone MUST be with you the first 24 hours after you arrive home or your discharge will be delayed. 9. Please wear clothes that are easy to get on and off and wear slip-on shoes.  Thank you for allowing [place] to care for you!   --  Invasive Cardiovascular services

## 2018-12-09 ENCOUNTER — Telehealth: Payer: Self-pay | Admitting: *Deleted

## 2018-12-09 NOTE — Telephone Encounter (Signed)
Pt contacted pre-catheterization scheduled at [facility] for: December 10, 2018 9 AM Verified arrival time and place: [facility] Entrance A at: 7 AM  No solid food after midnight prior to cath, clear liquids until 5 AM day of procedure. Contrast allergy: no  AM meds can be  taken pre-cath with sip of water including: ASA 81 mg  Confirmed patient has responsible person to drive home post procedure and observe 24 hours after arriving home: yes   1.Have you been in contact with someone that was confirmed or suspected to have Covid 19 virus?  NO 2.Do you have any of the following symptoms, cough,fever (100.4 or greater), and /or shortness of breath?  NO 3. Have you travelled internationally in the last month? NO  Please be advised that [facility] is restricting visitors at this time and request that only patients present for check-in prior to their appointment.  If necessary, only one visitor may come with you into the building. All visitors should remain in the waiting area.  For everyone's safety, all patients and visitors entering our practice area should expect to be screened again prior to entering our waiting area.   I reviewed instructions, screening questions , and information about visitors with patient.

## 2018-12-10 ENCOUNTER — Telehealth: Payer: Self-pay

## 2018-12-10 ENCOUNTER — Ambulatory Visit (HOSPITAL_COMMUNITY)
Admission: RE | Admit: 2018-12-10 | Discharge: 2018-12-10 | Disposition: A | Discharge status: Home / Self Care | Payer: Medicare Other | Attending: Interventional Cardiology | Admitting: Interventional Cardiology

## 2018-12-10 ENCOUNTER — Other Ambulatory Visit: Payer: Self-pay

## 2018-12-10 ENCOUNTER — Encounter (HOSPITAL_COMMUNITY)
Admission: RE | Disposition: A | Discharge status: Home / Self Care | Payer: Self-pay | Source: Home / Self Care | Attending: Interventional Cardiology

## 2018-12-10 DIAGNOSIS — Z1211 Encounter for screening for malignant neoplasm of colon: Secondary | ICD-10-CM

## 2018-12-10 DIAGNOSIS — Z886 Allergy status to analgesic agent status: Secondary | ICD-10-CM | POA: Diagnosis not present

## 2018-12-10 DIAGNOSIS — I700 Atherosclerosis of aorta: Secondary | ICD-10-CM | POA: Diagnosis present

## 2018-12-10 DIAGNOSIS — E785 Hyperlipidemia, unspecified: Secondary | ICD-10-CM | POA: Diagnosis present

## 2018-12-10 DIAGNOSIS — I10 Essential (primary) hypertension: Secondary | ICD-10-CM | POA: Diagnosis not present

## 2018-12-10 DIAGNOSIS — F329 Major depressive disorder, single episode, unspecified: Secondary | ICD-10-CM | POA: Insufficient documentation

## 2018-12-10 DIAGNOSIS — Z882 Allergy status to sulfonamides status: Secondary | ICD-10-CM | POA: Insufficient documentation

## 2018-12-10 DIAGNOSIS — F419 Anxiety disorder, unspecified: Secondary | ICD-10-CM | POA: Diagnosis not present

## 2018-12-10 DIAGNOSIS — I2510 Atherosclerotic heart disease of native coronary artery without angina pectoris: Secondary | ICD-10-CM | POA: Insufficient documentation

## 2018-12-10 DIAGNOSIS — Z791 Long term (current) use of non-steroidal anti-inflammatories (NSAID): Secondary | ICD-10-CM | POA: Diagnosis not present

## 2018-12-10 DIAGNOSIS — M1990 Unspecified osteoarthritis, unspecified site: Secondary | ICD-10-CM | POA: Insufficient documentation

## 2018-12-10 DIAGNOSIS — Z880 Allergy status to penicillin: Secondary | ICD-10-CM | POA: Diagnosis not present

## 2018-12-10 DIAGNOSIS — Q211 Atrial septal defect: Secondary | ICD-10-CM | POA: Diagnosis not present

## 2018-12-10 DIAGNOSIS — Z955 Presence of coronary angioplasty implant and graft: Secondary | ICD-10-CM | POA: Diagnosis not present

## 2018-12-10 DIAGNOSIS — E7800 Pure hypercholesterolemia, unspecified: Secondary | ICD-10-CM | POA: Insufficient documentation

## 2018-12-10 DIAGNOSIS — Z79899 Other long term (current) drug therapy: Secondary | ICD-10-CM | POA: Diagnosis not present

## 2018-12-10 DIAGNOSIS — I252 Old myocardial infarction: Secondary | ICD-10-CM | POA: Diagnosis not present

## 2018-12-10 DIAGNOSIS — Z7982 Long term (current) use of aspirin: Secondary | ICD-10-CM | POA: Diagnosis not present

## 2018-12-10 DIAGNOSIS — Z87891 Personal history of nicotine dependence: Secondary | ICD-10-CM | POA: Diagnosis not present

## 2018-12-10 DIAGNOSIS — R9439 Abnormal result of other cardiovascular function study: Secondary | ICD-10-CM | POA: Diagnosis present

## 2018-12-10 DIAGNOSIS — Z01810 Encounter for preprocedural cardiovascular examination: Secondary | ICD-10-CM

## 2018-12-10 DIAGNOSIS — I219 Acute myocardial infarction, unspecified: Secondary | ICD-10-CM

## 2018-12-10 HISTORY — PX: LEFT HEART CATH AND CORONARY ANGIOGRAPHY: CATH118249

## 2018-12-10 SURGERY — LEFT HEART CATH AND CORONARY ANGIOGRAPHY
Anesthesia: LOCAL

## 2018-12-10 MED ORDER — HEPARIN SODIUM (PORCINE) 1000 UNIT/ML IJ SOLN
INTRAMUSCULAR | Status: DC | PRN
  Administered 2018-12-10: 3500 [IU] via INTRAVENOUS

## 2018-12-10 MED ORDER — VERAPAMIL HCL 2.5 MG/ML IV SOLN
INTRAVENOUS | Status: DC | PRN
  Administered 2018-12-10: 10 mL via INTRA_ARTERIAL

## 2018-12-10 MED ORDER — SODIUM CHLORIDE 0.9 % IV SOLN: 250.0000 mL | INTRAVENOUS | Status: DC | PRN

## 2018-12-10 MED ORDER — SODIUM CHLORIDE 0.9% FLUSH: 3.0000 mL | Freq: Two times a day (BID) | INTRAVENOUS | Status: DC

## 2018-12-10 MED ORDER — LIDOCAINE HCL (PF) 1 % IJ SOLN
INTRAMUSCULAR | Status: AC
  Filled 2018-12-10: qty 30

## 2018-12-10 MED ORDER — LIDOCAINE HCL (PF) 1 % IJ SOLN
INTRAMUSCULAR | Status: DC | PRN
  Administered 2018-12-10: 2 mL

## 2018-12-10 MED ORDER — OXYCODONE HCL 5 MG PO TABS: 5.0000 mg | ORAL_TABLET | ORAL | Status: DC | PRN

## 2018-12-10 MED ORDER — HEPARIN SODIUM (PORCINE) 1000 UNIT/ML IJ SOLN
INTRAMUSCULAR | Status: AC
  Filled 2018-12-10: qty 1

## 2018-12-10 MED ORDER — FENTANYL CITRATE (PF) 100 MCG/2ML IJ SOLN
INTRAMUSCULAR | Status: DC | PRN
  Administered 2018-12-10 (×2): 25 ug via INTRAVENOUS

## 2018-12-10 MED ORDER — HEPARIN (PORCINE) IN NACL 1000-0.9 UT/500ML-% IV SOLN
INTRAVENOUS | Status: DC | PRN
  Administered 2018-12-10 (×2): 500 mL

## 2018-12-10 MED ORDER — IOHEXOL 350 MG/ML SOLN
INTRAVENOUS | Status: DC | PRN
  Administered 2018-12-10: 55 mL via INTRA_ARTERIAL

## 2018-12-10 MED ORDER — SODIUM CHLORIDE 0.9 % WEIGHT BASED INFUSION: 1.0000 mL/kg/h | INTRAVENOUS | Status: DC

## 2018-12-10 MED ORDER — ONDANSETRON HCL 4 MG/2ML IJ SOLN: 4.0000 mg | Freq: Four times a day (QID) | INTRAMUSCULAR | Status: DC | PRN

## 2018-12-10 MED ORDER — SODIUM CHLORIDE 0.9 % WEIGHT BASED INFUSION
3.0000 mL/kg/h | INTRAVENOUS | Status: AC
  Administered 2018-12-10: 3 mL/kg/h via INTRAVENOUS

## 2018-12-10 MED ORDER — HEPARIN (PORCINE) IN NACL 1000-0.9 UT/500ML-% IV SOLN
INTRAVENOUS | Status: AC
  Filled 2018-12-10: qty 1000

## 2018-12-10 MED ORDER — MIDAZOLAM HCL 2 MG/2ML IJ SOLN
INTRAMUSCULAR | Status: DC | PRN
  Administered 2018-12-10: 0.5 mg via INTRAVENOUS
  Administered 2018-12-10: 1 mg via INTRAVENOUS

## 2018-12-10 MED ORDER — VERAPAMIL HCL 2.5 MG/ML IV SOLN
INTRAVENOUS | Status: AC
  Filled 2018-12-10: qty 2

## 2018-12-10 MED ORDER — SODIUM CHLORIDE 0.9 % IV SOLN: INTRAVENOUS | Status: AC

## 2018-12-10 MED ORDER — SODIUM CHLORIDE 0.9% FLUSH: 3.0000 mL | INTRAVENOUS | Status: DC | PRN

## 2018-12-10 MED ORDER — FENTANYL CITRATE (PF) 100 MCG/2ML IJ SOLN
INTRAMUSCULAR | Status: AC
  Filled 2018-12-10: qty 2

## 2018-12-10 MED ORDER — ACETAMINOPHEN 325 MG PO TABS: 650.0000 mg | ORAL_TABLET | ORAL | Status: DC | PRN

## 2018-12-10 MED ORDER — MIDAZOLAM HCL 2 MG/2ML IJ SOLN
INTRAMUSCULAR | Status: AC
  Filled 2018-12-10: qty 2

## 2018-12-10 MED ORDER — ASPIRIN 81 MG PO CHEW: 81.0000 mg | CHEWABLE_TABLET | ORAL | Status: DC

## 2018-12-10 SURGICAL SUPPLY — 11 items
CATH 5FR JL3.5 JR4 ANG PIG MP (CATHETERS) ×2 IMPLANT
DEVICE RAD COMP TR BAND LRG (VASCULAR PRODUCTS) ×2 IMPLANT
GLIDESHEATH SLEND A-KIT 6F 22G (SHEATH) ×2 IMPLANT
GUIDEWIRE INQWIRE 1.5J.035X260 (WIRE) ×1 IMPLANT
INQWIRE 1.5J .035X260CM (WIRE) ×2
KIT HEART LEFT (KITS) ×2 IMPLANT
PACK CARDIAC CATHETERIZATION (CUSTOM PROCEDURE TRAY) ×2 IMPLANT
SHEATH PROBE COVER 6X72 (BAG) ×2 IMPLANT
TRANSDUCER W/STOPCOCK (MISCELLANEOUS) ×2 IMPLANT
TUBING CIL FLEX 10 FLL-RA (TUBING) ×2 IMPLANT
WIRE HI TORQ VERSACORE-J 145CM (WIRE) ×2 IMPLANT

## 2018-12-10 NOTE — CV Procedure (Signed)
   Coronary angiography via right radial approach using ultrasound guidance.  50 to 60% mid LAD.  30% mid circumflex.  Widely patent RCA stents.  Normal LV function.  Continue aggressive risk factor modification.  Cleared for upcoming back surgery.

## 2018-12-10 NOTE — Discharge Instructions (Signed)
Radial Site Care ° °This sheet gives you information about how to care for yourself after your procedure. Your health care provider may also give you more specific instructions. If you have problems or questions, contact your health care provider. °What can I expect after the procedure? °After the procedure, it is common to have: °· Bruising and tenderness at the catheter insertion area. °Follow these instructions at home: °Medicines °· Take over-the-counter and prescription medicines only as told by your health care provider. °Insertion site care °· Follow instructions from your health care provider about how to take care of your insertion site. Make sure you: °? Wash your hands with soap and water before you change your bandage (dressing). If soap and water are not available, use hand sanitizer. °? Change your dressing as told by your health care provider. °? Leave stitches (sutures), skin glue, or adhesive strips in place. These skin closures may need to stay in place for 2 weeks or longer. If adhesive strip edges start to loosen and curl up, you may trim the loose edges. Do not remove adhesive strips completely unless your health care provider tells you to do that. °· Check your insertion site every day for signs of infection. Check for: °? Redness, swelling, or pain. °? Fluid or blood. °? Pus or a bad smell. °? Warmth. °· Do not take baths, swim, or use a hot tub until your health care provider approves. °· You may shower 24-48 hours after the procedure, or as directed by your health care provider. °? Remove the dressing and gently wash the site with plain soap and water. °? Pat the area dry with a clean towel. °? Do not rub the site. That could cause bleeding. °· Do not apply powder or lotion to the site. °Activity ° °· For 24 hours after the procedure, or as directed by your health care provider: °? Do not flex or bend the affected arm. °? Do not push or pull heavy objects with the affected arm. °? Do not  drive yourself home from the hospital or clinic. You may drive 24 hours after the procedure unless your health care provider tells you not to. °? Do not operate machinery or power tools. °· Do not lift anything that is heavier than 10 lb (4.5 kg), or the limit that you are told, until your health care provider says that it is safe. °· Ask your health care provider when it is okay to: °? Return to work or school. °? Resume usual physical activities or sports. °? Resume sexual activity. °General instructions °· If the catheter site starts to bleed, raise your arm and put firm pressure on the site. If the bleeding does not stop, get help right away. This is a medical emergency. °· If you went home on the same day as your procedure, a responsible adult should be with you for the first 24 hours after you arrive home. °· Keep all follow-up visits as told by your health care provider. This is important. °Contact a health care provider if: °· You have a fever. °· You have redness, swelling, or yellow drainage around your insertion site. °Get help right away if: °· You have unusual pain at the radial site. °· The catheter insertion area swells very fast. °· The insertion area is bleeding, and the bleeding does not stop when you hold steady pressure on the area. °· Your arm or hand becomes pale, cool, tingly, or numb. °These symptoms may represent a serious problem   that is an emergency. Do not wait to see if the symptoms will go away. Get medical help right away. Call your local emergency services (911 in the [place] Do not drive yourself to the hospital. °Summary °· After the procedure, it is common to have bruising and tenderness at the site. °· Follow instructions from your health care provider about how to take care of your radial site wound. Check the wound every day for signs of infection. °· Do not lift anything that is heavier than 10 lb (4.5 kg), or the limit that you are told, until your health care provider says  that it is safe. °This information is not intended to replace advice given to you by your health care provider. Make sure you discuss any questions you have with your health care provider. °Document Released: 10/12/2010 Document Revised: 10/15/2017 Document Reviewed: 10/15/2017 °Elsevier Interactive Patient Education © 2019 [employer] ° °

## 2018-12-10 NOTE — Interval H&P Note (Signed)
History and Physical Interval Note:  12/10/2018 9:14 AM Cath Lab Visit (complete for each Cath Lab visit)  Clinical Evaluation Leading to the Procedure:   ACS: No.  Non-ACS:    Anginal Classification: CCS II  Anti-ischemic medical therapy: No Therapy  Non-Invasive Test Results: High-risk stress test findings: cardiac mortality >3%/year  Prior CABG: No previous CABG        Carol Love  has presented today for surgery, with the diagnosis of abnormal nuclear stress test.  The various methods of treatment have been discussed with the patient and family. After consideration of risks, benefits and other options for treatment, the patient has consented to  Procedure(s): LEFT HEART CATH AND CORONARY ANGIOGRAPHY (N/A) as a surgical intervention.  The patient's history has been reviewed, patient examined, no change in status, stable for surgery.  I have reviewed the patient's chart and labs.  Questions were answered to the patient's satisfaction.     Belva Crome III

## 2018-12-10 NOTE — Telephone Encounter (Signed)
Patient informed of results and information was forwarded to Dr. Annette Stable. No further questions at this time.

## 2018-12-10 NOTE — Progress Notes (Addendum)
Pt c/o pain in right wrist arm area, 5/10 when still 8 with movement. Right forarm tight feeling no specific raised area, pressure held 15 minutes, pa for cardiology paged, 10 cc of air added.  Lisabeth Pick rn held 10 minutes after me and Jonita Albee rn currently holding. Mendel Ryder pa for cardiology was notified and asked to see.  Aaron Edelman rt / cath lab holding pressure and applying bp cuff to right forearm. Dr Tamala Julian in to see pt along with [facility].

## 2018-12-10 NOTE — Progress Notes (Signed)
Called for forearm hematoma which occurred after Tr band deflations. Short Stay staff had reinflated the TR band. Patient experienced pain and has a right fore arm hematoma extending from the tr band to about 2 inches shy of the elbow. Manual blood pressure cuff applied to proximal forearm and inflated to maintain a pleth waveform as measured in the right thumb. Dr. Tamala Julian present and agreed with BP cuff application. Cuff inflated for 5 minutes maintaining pleath waveform, deflated for 3 minutes and re inflated for 5 minutes again. No further expansion of hematoma noted. Cuff moved distal to just below tr band and inflated for 5 minutes, deflated for 3 minutes and re-inflated for 5 minutes. Right forearm is hard with the hematoma. No additioal area of hematoma noted. SPo2 97%.  Patient states her arm hurts less. Capillary refill 1 second in finger tips. Warm compress in her right hand.

## 2018-12-11 ENCOUNTER — Encounter (HOSPITAL_COMMUNITY): Payer: Self-pay | Admitting: Interventional Cardiology

## 2019-01-06 ENCOUNTER — Telehealth (INDEPENDENT_AMBULATORY_CARE_PROVIDER_SITE_OTHER): Payer: Medicare Other | Admitting: Cardiology

## 2019-01-06 ENCOUNTER — Other Ambulatory Visit: Payer: Self-pay

## 2019-01-06 ENCOUNTER — Encounter: Payer: Self-pay | Admitting: Cardiology

## 2019-01-06 ENCOUNTER — Telehealth: Payer: Self-pay | Admitting: Cardiology

## 2019-01-06 VITALS — BP 145/95 | HR 70 | Ht 60.0 in | Wt 140.0 lb

## 2019-01-06 DIAGNOSIS — I10 Essential (primary) hypertension: Secondary | ICD-10-CM

## 2019-01-06 DIAGNOSIS — I2510 Atherosclerotic heart disease of native coronary artery without angina pectoris: Secondary | ICD-10-CM

## 2019-01-06 DIAGNOSIS — E7800 Pure hypercholesterolemia, unspecified: Secondary | ICD-10-CM

## 2019-01-06 NOTE — Patient Instructions (Signed)
Medication Instructions:  Your physician has recommended you make the following change in your medication:  START: Aspirin 81mg  (1 tab) daily  If you need a refill on your cardiac medications before your next appointment, please call your pharmacy.   Lab work: None If you have labs (blood work) drawn today and your tests are completely normal, you will receive your results only by: Marland Kitchen MyChart Message (if you have MyChart) OR . A paper copy in the mail If you have any lab test that is abnormal or we need to change your treatment, we will call you to review the results.  Testing/Procedures: None  Follow-Up: At [facility], you and your health needs are our priority.  As part of our continuing mission to provide you with exceptional heart care, we have created designated Provider Care Teams.  These Care Teams include your primary Cardiologist (physician) and Advanced Practice Providers (APPs -  Physician Assistants and Nurse Practitioners) who all work together to provide you with the care you need, when you need it. You will need a follow up appointment in 4 months.  Any Other Special Instructions Will Be Listed Below (If Applicable).

## 2019-01-06 NOTE — Telephone Encounter (Signed)
Virtual Visit Pre-Appointment Phone Call  Steps For Call:  1. Confirm consent - "In the setting of the current Covid19 crisis, you are scheduled for a (phone or video) visit with your provider on (date) at (time).  Just as we do with many in-office visits, in order for you to participate in this visit, we must obtain consent.  If you'd like, I can send this to your mychart (if signed up) or email for you to review.  Otherwise, I can obtain your verbal consent now.  All virtual visits are billed to your insurance company just like a normal visit would be.  By agreeing to a virtual visit, we'd like you to understand that the technology does not allow for your provider to perform an examination, and thus may limit your provider's ability to fully assess your condition.  Finally, though the technology is pretty good, we cannot assure that it will always work on either your or our end, and in the setting of a video visit, we may have to convert it to a phone-only visit.  In either situation, we cannot ensure that we have a secure connection.  Are you willing to proceed?" STAFF: Did the patient verbally acknowledge consent to telehealth visit? Document YES/NO here: YES  2. Confirm the BEST phone number to call the day of the visit by including in appointment notes  3. Give patient instructions for WebEx/MyChart download to smartphone as below or Doximity/Doxy.me if video visit (depending on what platform provider is using)  4. Advise patient to be prepared with their blood pressure, heart rate, weight, any heart rhythm information, their current medicines, and a piece of paper and pen handy for any instructions they may receive the day of their visit  5. Inform patient they will receive a phone call 15 minutes prior to their appointment time (may be from unknown caller ID) so they should be prepared to answer  6. Confirm that appointment type is correct in Epic appointment notes (VIDEO vs  PHONE)     TELEPHONE CALL NOTE  Carol Love has been deemed a candidate for a follow-up tele-health visit to limit community exposure during the Covid-19 pandemic. I spoke with the patient via phone to ensure availability of phone/video source, confirm preferred email & phone number, and discuss instructions and expectations.  I reminded Carol Love to be prepared with any vital sign and/or heart rhythm information that could potentially be obtained via home monitoring, at the time of her visit. I reminded Carol Love to expect a phone call at the time of her visit if her visit.  Frederic Jericho 01/06/2019 1:06 PM   INSTRUCTIONS FOR DOWNLOADING THE [place] APP TO SMARTPHONE  - If Apple, ask patient to go to App Store and type in WebEx in the search bar. [place] [employer], the blue/green circle. If Android, go to [employer] and type in [employer] in the search bar. The app is free but as with any other app downloads, their phone may require them to verify saved payment information or Apple/Android password.  - The patient does NOT have to create an account. - On the day of the visit, the assist will walk the patient through joining the meeting with the meeting number/password.  INSTRUCTIONS FOR DOWNLOADING THE MYCHART APP TO SMARTPHONE  - The patient must first make sure to have activated MyChart and know their login information - If Apple, go to [employer] and type in  MyChart in the search bar and download the app. If Android, ask patient to go to [employer] and type in [place] in the search bar and download the app. The app is free but as with any other app downloads, their phone may require them to verify saved payment information or Apple/Android password.  - The patient will need to then log into the app with their MyChart username and password, and select [facility] as their healthcare provider to link the account. When it is time for your visit, go to the  MyChart app, find appointments, and click Begin Video Visit. Be sure to Select Allow for your device to access the Microphone and Camera for your visit. You will then be connected, and your provider will be with you shortly.  **If they have any issues connecting, or need assistance please contact MyChart service desk [phone])**  **If using a computer, in order to ensure the best quality for their visit they will need to use either of the following Internet Browsers: [employer], or Google Chrome**  IF USING DOXIMITY or DOXY.ME - The patient will receive a link just prior to their visit, either by text or email (to be determined day of appointment depending on if it's doxy.me or Doximity).     FULL LENGTH CONSENT FOR TELE-HEALTH VISIT   I hereby voluntarily request, consent and authorize [facility] and its employed or contracted physicians, physician assistants, nurse practitioners or other licensed health care professionals (the Practitioner), to provide me with telemedicine health care services (the Services") as deemed necessary by the treating Practitioner. I acknowledge and consent to receive the Services by the Practitioner via telemedicine. I understand that the telemedicine visit will involve communicating with the Practitioner through live audiovisual communication technology and the disclosure of certain medical information by electronic transmission. I acknowledge that I have been given the opportunity to request an in-person assessment or other available alternative prior to the telemedicine visit and am voluntarily participating in the telemedicine visit.  I understand that I have the right to withhold or withdraw my consent to the use of telemedicine in the course of my care at any time, without affecting my right to future care or treatment, and that the Practitioner or I may terminate the telemedicine visit at any time. I understand that I have the right to  inspect all information obtained and/or recorded in the course of the telemedicine visit and may receive copies of available information for a reasonable fee.  I understand that some of the potential risks of receiving the Services via telemedicine include:   Delay or interruption in medical evaluation due to technological equipment failure or disruption;  Information transmitted may not be sufficient (e.g. poor resolution of images) to allow for appropriate medical decision making by the Practitioner; and/or   In rare instances, security protocols could fail, causing a breach of personal health information.  Furthermore, I acknowledge that it is my responsibility to provide information about my medical history, conditions and care that is complete and accurate to the best of my ability. I acknowledge that Practitioner's advice, recommendations, and/or decision may be based on factors not within their control, such as incomplete or inaccurate data provided by me or distortions of diagnostic images or specimens that may result from electronic transmissions. I understand that the practice of medicine is not an exact science and that Practitioner makes no warranties or guarantees regarding treatment outcomes. I acknowledge that I will receive a copy  of this consent concurrently upon execution via email to the email address I last provided but may also request a printed copy by calling the office of [facility].    I understand that my insurance will be billed for this visit.   I have read or had this consent read to me.  I understand the contents of this consent, which adequately explains the benefits and risks of the Services being provided via telemedicine.   I have been provided ample opportunity to ask questions regarding this consent and the Services and have had my questions answered to my satisfaction.  I give my informed consent for the services to be provided through the use of  telemedicine in my medical care  By participating in this telemedicine visit I agree to the above.

## 2019-01-06 NOTE — Progress Notes (Signed)
Virtual Visit via Video Note   This visit type was conducted due to national recommendations for restrictions regarding the COVID-19 Pandemic (e.g. social distancing) in an effort to limit this patient's exposure and mitigate transmission in our community.  Due to her co-morbid illnesses, this patient is at least at moderate risk for complications without adequate follow up.  This format is felt to be most appropriate for this patient at this time.  All issues noted in this document were discussed and addressed.  A limited physical exam was performed with this format.  Please refer to the patient's chart for her consent to telehealth for [facility].   Evaluation Performed:  Follow-up visit  Date:  01/06/2019   ID:  Carol Love, DOB 01-14-1951, MRN [id number]  Patient Location: Home Provider Location: Home  PCP:  Levin Erp, MD  Cardiologist:  No primary care provider on file.  Electrophysiologist:  None   Chief Complaint:  CAD  History of Present Illness:    Carol Love is a 68 y.o. female and was evaluated for preop cardiovascular evaluation.  This revealed abnormal stress test and patient underwent coronary angiography.  Report is mentioned below.  Subsequently she is done fine.  No chest pain orthopnea or PND.  She leads a sedentary lifestyle because of orthopedic issues and back pain.  Her blood pressure is elevated today and she tells me that she is in significant pain with her back.  At the time of my evaluation, the patient is alert awake oriented and in no distress.  The patient does not have symptoms concerning for COVID-19 infection (fever, chills, cough, or new shortness of breath).    Past Medical History:  Diagnosis Date  . Anxiety   . Arthritis   . Constipation    uses stool softener with stimulant  . Coronary artery disease   . Depression   . Headache    last h/a  2000  . Hyperlipidemia   . Hypertension   . Irregular heart beat   . Myocardial  infarction [facility]) 2014   stent   Past Surgical History:  Procedure Laterality Date  . APPENDECTOMY  05  . CARDIAC CATHETERIZATION  14   stent  . CARDIOVASCULAR STRESS TEST  03/07/15  . COLONOSCOPY    . CORONARY STENT PLACEMENT    . DILATION AND CURETTAGE OF UTERUS    . LEFT HEART CATH AND CORONARY ANGIOGRAPHY N/A 12/10/2018   Procedure: LEFT HEART CATH AND CORONARY ANGIOGRAPHY;  Surgeon: Belva Crome, MD;  Location: [facility];  Service: Cardiovascular;  Laterality: N/A;  . MAXIMUM ACCESS (MAS)POSTERIOR LUMBAR INTERBODY FUSION (PLIF) 1 LEVEL N/A 03/20/2015   Procedure: FOR MAXIMUM ACCESS (MAS) POSTERIOR LUMBAR INTERBODY FUSION (PLIF) 1 LEVEL;  Surgeon: Earnie Larsson, MD;  Location: [facility];  Service: Neurosurgery;  Laterality: N/A;  FOR MAXIMUM ACCESS (MAS) POSTERIOR LUMBAR INTERBODY FUSION (PLIF) 1 LEVEL LUMBAR FOUR-FIVE  . NASAL SEPTUM SURGERY  1985   s/p fall  . SHOULDER ARTHROSCOPY Left 08  . TONSILLECTOMY       Current Meds  Medication Sig  . acebutolol (SECTRAL) 200 MG capsule Take 1 capsule (200 mg total) by mouth daily.  Marland Kitchen ALPRAZolam (XANAX) 0.5 MG tablet Take 1 mg by mouth every evening.  Marland Kitchen amitriptyline (ELAVIL) 50 MG tablet Take 50 mg by mouth at bedtime.  Marland Kitchen aspirin 81 MG chewable tablet Chew 162 mg by mouth every evening.  . calcium citrate-vitamin D (CITRACAL+D) 315-200 MG-UNIT  per tablet Take 1 tablet by mouth daily.   . cyclobenzaprine (FLEXERIL) 10 MG tablet Take 10 mg by mouth 3 (three) times daily as needed for muscle spasms.  Marland Kitchen gabapentin (NEURONTIN) 300 MG capsule Take 1,200 mg by mouth at bedtime.   . meloxicam (MOBIC) 15 MG tablet Take 15 mg by mouth daily.   . Multiple Vitamin (MULTIVITAMIN) capsule Take 1 capsule by mouth daily.  . sertraline (ZOLOFT) 100 MG tablet Take 100 mg by mouth daily.  . traMADol (ULTRAM) 50 MG tablet Take 50-100 mg by mouth every 6 (six) hours as needed for moderate pain.   Current Facility-Administered Medications for the  01/06/19 encounter (Telemedicine) with Crystian Frith, Reita Cliche, MD  Medication  . 0.9 %  sodium chloride infusion     Allergies:   Epinephrine; Ibuprofen; Penicillins; and Sulfa antibiotics   Social History   Tobacco Use  . Smoking status: Former Smoker    Packs/day: 0.50    Years: 45.00    Pack years: 22.50    Types: Cigarettes    Last attempt to quit: 10/09/2012    Years since quitting: 6.2  . Smokeless tobacco: Never Used  Substance Use Topics  . Alcohol use: Yes    Alcohol/week: 5.0 standard drinks    Types: 5 Glasses of wine per week    Comment: sev times a week- 1-2 glasses of wine   . Drug use: No     Family Hx: The patient's family history includes AAA (abdominal aortic aneurysm) in her mother; Heart disease in her father and mother; Hyperlipidemia in her mother; Lung cancer in her mother. There is no history of Colon cancer, Colon polyps, Esophageal cancer, Rectal cancer, or Stomach cancer.  ROS:   Please see the history of present illness. Patient denies any history of chest pain orthopnea or PND All other systems reviewed and are negative.   Prior CV studies:   The following studies were reviewed today:  Belva Crome, MD (Primary)    Procedures   LEFT HEART CATH AND CORONARY ANGIOGRAPHY  Conclusion    Widely patent left main coronary artery  Proximal eccentric 60 to 70% LAD with TIMI grade III flow and no indication for treatment.  30% proximal to mid circumflex.  Located within a region of calcification.  Widely patent proximal to mid RCA stent  Normal LV systolic function EF 16% and LVEDP 18 mmHg.  RECOMMENDATIONS:   Cleared for upcoming lumbar surgery.  Continue aggressive secondary risk factor modification.  In absence of symptoms, no indication for interventional/revascularization therapy.     Labs/Other Tests and Data Reviewed:    EKG:    Recent Labs: 11/19/2018: BUN 13; Creatinine, Ser 0.87; Hemoglobin 14.4; Platelets 207; Potassium  4.0; Sodium 139 11/30/2018: ALT 39   Recent Lipid Panel Lab Results  Component Value Date/Time   CHOL 163 11/30/2018 12:31 PM   TRIG 250 (H) 11/30/2018 12:31 PM   HDL 49 11/30/2018 12:31 PM   CHOLHDL 3.3 11/30/2018 12:31 PM   LDLCALC 64 11/30/2018 12:31 PM    Wt Readings from Last 3 Encounters:  01/06/19 140 lb (63.5 kg)  12/10/18 141 lb (64 kg)  12/08/18 144 lb (65.3 kg)     Objective:    Vital Signs:  BP (!) 145/95 (BP Location: Left Arm, Patient Position: Sitting, Cuff Size: Normal)   Pulse 70   Ht 5' (1.524 m)   Wt 140 lb (63.5 kg)   BMI 27.34 kg/m    Well nourished,  well developed female in no acute distress. Patient is in no distress.  She cannot appear to be comfortable by phone.  She was cheerful.  She was just seen discomfort because of her back.  ASSESSMENT & PLAN:    1. Coronary artery disease: Secondary prevention stressed with the patient.  Importance of compliance with diet and medication stressed and she vocalized understanding.  I urged her to be active especially in view of her orthopedic issues. 2. Her blood pressure is stable she is in some pain today and switch elevated but overall it seems to be controlled well 3. She mentions to me that she will see her primary care physician in the next month or so and get fasting blood work including lipids and send me a copy 4. She was advised to take 1 coated aspirin 81 mg on a daily basis 5. Patient will be seen in follow-up appointment in 6 months or earlier if the patient has any concerns   COVID-19 Education: The signs and symptoms of COVID-19 were discussed with the patient and how to seek care for testing (follow up with PCP or arrange E-visit).  The importance of social distancing was discussed today.  Time:   Today, I have spent 17 minutes with the patient with telehealth technology discussing the above problems.     Medication Adjustments/Labs and Tests Ordered: Current medicines are reviewed at length  with the patient today.  Concerns regarding medicines are outlined above.   Tests Ordered: No orders of the defined types were placed in this encounter.   Medication Changes: No orders of the defined types were placed in this encounter.   Disposition:  Follow up in 4 month(s)  Signed, Jenean Lindau, MD  01/06/2019 1:35 PM    [facility]

## 2019-01-15 ENCOUNTER — Other Ambulatory Visit: Payer: Self-pay | Admitting: Neurosurgery

## 2019-01-21 ENCOUNTER — Other Ambulatory Visit: Payer: Self-pay | Admitting: Neurosurgery

## 2019-02-01 DIAGNOSIS — I10 Essential (primary) hypertension: Secondary | ICD-10-CM | POA: Diagnosis not present

## 2019-02-01 DIAGNOSIS — E7800 Pure hypercholesterolemia, unspecified: Secondary | ICD-10-CM | POA: Diagnosis not present

## 2019-02-01 DIAGNOSIS — M1990 Unspecified osteoarthritis, unspecified site: Secondary | ICD-10-CM | POA: Diagnosis not present

## 2019-02-01 DIAGNOSIS — I2510 Atherosclerotic heart disease of native coronary artery without angina pectoris: Secondary | ICD-10-CM | POA: Diagnosis not present

## 2019-02-08 ENCOUNTER — Ambulatory Visit: Payer: Medicare Other | Admitting: Cardiology

## 2019-02-16 NOTE — Pre-Procedure Instructions (Signed)
Carol Love  02/16/2019      [facility] - [place] - [address] [place] [address] Phone: [phone] Fax: [phone]    Your procedure is scheduled on June 3  Report to [facility] Entrance A at 9:48 A.M.  Call this number if you have problems the morning of surgery:  [phone]   Remember:   Do not eat or drink after midnight.                        Take these medicines the morning of surgery with A SIP OF WATER:             Acebutolol (sectral)            Alprazolam (xanax)            Cyclobenzaprine (flexeril)            Sertraline(zoloft)               7 days prior to surgery STOP taking any Aspirin (unless otherwise instructed by your surgeon), Aleve, Naproxen, Ibuprofen, Motrin, Advil, Goody's, BC's, all herbal medications, fish oil, and all vitamins.               Follow your surgeon's instructions on when to stop Aspirin.  If no instructions were given by your surgeon then you will need to call the office to get those instructions.       Do not wear jewelry, make-up or nail polish.  Do not wear lotions, powders, or perfumes, or deodorant.  Do not shave 48 hours prior to surgery.  Men may shave face and neck.  Do not bring valuables to the hospital.  [facility] is not responsible for any belongings or valuables.  Contacts, dentures or bridgework may not be worn into surgery.  Leave your suitcase in the car.  After surgery it may be brought to your room.  For patients admitted to the hospital, discharge time will be determined by your treatment team.  Patients discharged the day of surgery will not be allowed to drive home.    Special instructions:  [facility]- Preparing For Surgery  Before surgery, you can play an important role. Because skin is not sterile, your skin needs to be as free of germs as possible. You can reduce the number of germs on your skin by washing with CHG  (chlorahexidine gluconate) Soap before surgery.  CHG is an antiseptic cleaner which kills germs and bonds with the skin to continue killing germs even after washing.    Oral Hygiene is also important to reduce your risk of infection.  Remember - BRUSH YOUR TEETH THE MORNING OF SURGERY WITH YOUR REGULAR TOOTHPASTE  Please do not use if you have an allergy to CHG or antibacterial soaps. If your skin becomes reddened/irritated stop using the CHG.  Do not shave (including legs and underarms) for at least 48 hours prior to first CHG shower. It is OK to shave your face.  Please follow these instructions carefully.   1. Shower the NIGHT BEFORE SURGERY and the MORNING OF SURGERY with CHG.   2. If you chose to wash your hair, wash your hair first as usual with your normal shampoo.  3. After you shampoo, rinse your hair and body thoroughly to remove the shampoo.  4. Use CHG as you would any other liquid soap. You can apply CHG  directly to the skin and wash gently with a scrungie or a clean washcloth.   5. Apply the CHG Soap to your body ONLY FROM THE NECK DOWN.  Do not use on open wounds or open sores. Avoid contact with your eyes, ears, mouth and genitals (private parts). Wash Face and genitals (private parts)  with your normal soap.  6. Wash thoroughly, paying special attention to the area where your surgery will be performed.  7. Thoroughly rinse your body with warm water from the neck down.  8. DO NOT shower/wash with your normal soap after using and rinsing off the CHG Soap.  9. Pat yourself dry with a CLEAN TOWEL.  10. Wear CLEAN PAJAMAS to bed the night before surgery, wear comfortable clothes the morning of surgery  11. Place CLEAN SHEETS on your bed the night of your first shower and DO NOT SLEEP WITH PETS.    Day of Surgery:  Do not apply any deodorants/lotions.  Please wear clean clothes to the hospital/surgery center.   Remember to brush your teeth WITH YOUR REGULAR  TOOTHPASTE.    Please read over the following fact sheets that you were given. Coughing and Deep Breathing, MRSA Information and Surgical Site Infection Prevention

## 2019-02-17 ENCOUNTER — Encounter (HOSPITAL_COMMUNITY)
Admission: RE | Admit: 2019-02-17 | Discharge: 2019-02-17 | Disposition: A | Discharge status: Home / Self Care | Payer: Medicare Other | Source: Ambulatory Visit | Attending: Neurosurgery | Admitting: Neurosurgery

## 2019-02-17 ENCOUNTER — Encounter (HOSPITAL_COMMUNITY): Payer: Self-pay

## 2019-02-17 ENCOUNTER — Other Ambulatory Visit: Payer: Self-pay

## 2019-02-17 DIAGNOSIS — Z01812 Encounter for preprocedural laboratory examination: Secondary | ICD-10-CM | POA: Diagnosis not present

## 2019-02-17 HISTORY — DX: Psoriasis, unspecified: L40.9

## 2019-02-17 LAB — BASIC METABOLIC PANEL
Anion gap: 11 (ref 5–15)
BUN: 11 mg/dL (ref 8–23)
CO2: 27 mmol/L (ref 22–32)
Calcium: 9.9 mg/dL (ref 8.9–10.3)
Chloride: 103 mmol/L (ref 98–111)
Creatinine, Ser: 0.93 mg/dL (ref 0.44–1.00)
GFR calc Af Amer: >60 mL/min (ref >60)
GFR calc non Af Amer: >60 mL/min (ref >60)
Glucose, Bld: 82 mg/dL (ref 70–99)
Potassium: 4.4 mmol/L (ref 3.5–5.1)
Sodium: 141 mmol/L (ref 135–145)

## 2019-02-17 LAB — CBC WITH DIFFERENTIAL/PLATELET
Abs Immature Granulocytes: 0.01 10*3/uL (ref 0.00–0.07)
Basophils Absolute: 0 10*3/uL (ref 0.0–0.1)
Basophils Relative: 1 %
Eosinophils Absolute: 0.4 10*3/uL (ref 0.0–0.5)
Eosinophils Relative: 8 %
HCT: 39.7 % (ref 36.0–46.0)
Hemoglobin: 13.7 g/dL (ref 12.0–15.0)
Immature Granulocytes: 0 %
Lymphocytes Relative: 27 %
Lymphs Abs: 1.5 10*3/uL (ref 0.7–4.0)
MCH: 32.5 pg (ref 26.0–34.0)
MCHC: 34.5 g/dL (ref 30.0–36.0)
MCV: 94.3 fL (ref 80.0–100.0)
Monocytes Absolute: 0.5 10*3/uL (ref 0.1–1.0)
Monocytes Relative: 9 %
Neutro Abs: 2.9 10*3/uL (ref 1.7–7.7)
Neutrophils Relative %: 55 %
Platelets: 216 10*3/uL (ref 150–400)
RBC: 4.21 MIL/uL (ref 3.87–5.11)
RDW: 11.8 % (ref 11.5–15.5)
WBC: 5.3 10*3/uL (ref 4.0–10.5)
nRBC: 0 % (ref 0.0–0.2)

## 2019-02-17 LAB — SURGICAL PCR SCREEN
MRSA, PCR: NEGATIVE
Staphylococcus aureus: NEGATIVE

## 2019-02-17 LAB — TYPE AND SCREEN
ABO/RH(D): O POS
Antibody Screen: NEGATIVE

## 2019-02-17 NOTE — Progress Notes (Signed)
Patient informed of the Visitor Restriction Policy that is now in place.  Verbalized understanding.  PCP - Dr Levin Erp Cardiologist - Dr Smith/Dr Revankar  Chest x-ray - 12/08/18 EKG - 11/25/18 Stress Test - 11/26/18 ECHO -Denies Cardiac Cath - 12/10/18  ICD Pacemaker/Loop-Denies  Sleep Study - Denies CPAP - N/A  Aspirin Instructions: Patient to follow up with MD for instructions on taking aspirin prior to surgery.  Anesthesia review: yes-cardiac/clearance 01/06/19   Coronavirus Screening  Have you or your husband experienced the following symptoms:  Cough yes/no: No Fever (>100.16F)  yes/no: No Runny nose yes/no: No Sore throat yes/no: No Difficulty breathing/shortness of breath  yes/no: No  Have you or a family member traveled in the last 14 days and where? yes/no: No   Patient verbalized understanding of instructions that were given to them at the PAT appointment. Patient was also instructed that they will need to review over the PAT instructions again at home before surgery.

## 2019-02-18 ENCOUNTER — Encounter (HOSPITAL_COMMUNITY): Payer: Self-pay

## 2019-02-18 NOTE — Progress Notes (Signed)
Anesthesia Chart Review:  Case:  [id number] Date/Time:  02/24/19 1133   Procedure:  PLIF - L2-L3 - L3-L4 (N/A Back)   Anesthesia type:  General   Pre-op diagnosis:  Spondylolisthesis   Location:  [facility] OR ROOM 3 / [place] [facility]   Surgeon:  Earnie Larsson, MD      DISCUSSION: Patient is a 68 year old female scheduled for the above procedure.  History includes former smoker (quit 2014), CAD (STEMI, s/p PCI/thrombectomy/DES RCA 12/13/12), HTN, HLD, psoriasis, "irregular heart beat" (controlled on acebutolol), L4-5 PLIF 03/20/15.   She recently underwent stress, coronary CT with FFR, and coronary angiography for ischemic evaluation prior to surgery. Results outlined below, but ultimately medical therapy was recommended for 60-70% LAD lesion with TIMI III and asymptomatic. Cardiologist Dr. Geraldo Pitter cleared her for upcoming surgery. Lorriane Shire at Dr. Marchelle Folks office to follow-up with patient regarding perioperative ASA instructions.  If no acute changes then I would anticipate that she can proceed as planned.   VS: BP (!) 154/86   Pulse 76   Temp 36.9 C   Resp 18   Ht 4' 11.25" (1.505 m)   Wt 65.3 kg   LMP  (LMP Unknown)   SpO2 99%   BMI 28.82 kg/m     PROVIDERS: Levin Erp, MD is PCP Jyl Heinz, MD is cardiologist   LABS: Labs reviewed: Acceptable for surgery. (all labs ordered are listed, but only abnormal results are displayed)  Labs Reviewed  SURGICAL PCR SCREEN  CBC WITH DIFFERENTIAL/PLATELET  BASIC METABOLIC PANEL  TYPE AND SCREEN    IMAGES: CXR 12/08/18 [facility]):  IMPRESSION: No active cardiopulmonary disease.  CT L-spine 10/20/18: IMPRESSION: 1. Status post L4-5 PLIF with arthrodesis. 2. Worsening L3-4 adjacent segment disease, widened facets associated with dynamic instability. New moderate RIGHT L3-4 disc protrusion may affect the exited RIGHT L3 nerve. 3. Grade 1 L2-3 retrolisthesis and grade 1 L4-5 anterolisthesis. 4. Moderate canal stenosis L3-4.  Mild canal  stenosis L2-3. 5. Multilevel neural foraminal narrowing: Moderate to severe on the RIGHT at L3-4. Aortic Atherosclerosis (ICD10-I70.0).   EKG: 11/24/18: NSR   CV: Cardiac cath 12/10/18:  Widely patent left main coronary artery  Proximal eccentric 60 to 70% LAD with TIMI grade III flow and no indication for treatment.  30% proximal to mid circumflex.  Located within a region of calcification.  Widely patent proximal to mid RCA stent  Normal LV systolic function EF 25% and LVEDP 18 mmHg. RECOMMENDATIONS:  Cleared for upcoming lumbar surgery.  Continue aggressive secondary risk factor modification.  In absence of symptoms, no indication for interventional/revascularization therapy.  CT coronary 12/04/18: IMPRESSION: 1. Severe CAD, CADRADS = 4V. Severe proximal LAD stenosis with features of positive remodeling, severe mid LAD stenosis. 70-99% stenosis. CT FFR anaylsis will be performed and reported separately. 2. The patient's coronary artery calcium score is 1078, which places the patient in the 73 percentile for age and sex matched control. 3. Normal coronary origin with right dominance. 4. Patent foramen ovale with left to right shunt. FFR 12/04/18: IMPRESSION: 1. CT FFR analysis showed indeterminate lesion by FFR in proximal LAD.  Nuclear stress test 11/26/18:  The left ventricular ejection fraction is normal (55-65%).  Nuclear stress EF: 64%.  There was no ST segment deviation noted during stress.  Defect 1: There is a medium defect of moderate severity present in the mid anterior, apical anterior and apex location.  This is a low risk study. Abnormal, low risk stress nuclear study with a  partially reversible distal anterior and apical defect; possible shifting breast attenuation but mild ischemia in the distal anterior wall/apex cannot be ruled out.  Gated ejection fraction 64% with normal wall motion.  Echo 06/07/16 (UNCRP-Cardiology; scanned under Media tab,  Correspondence 12/10/18): Conclusion: 1.  Left ventricle cavity is normal in size.  Calculated EF 60 to 65%. 2.  Mild to moderate mitral regurgitation. 3.  Trace tricuspid regurgitation. 4.  Trace pulmonic insufficiency.   Past Medical History:  Diagnosis Date  . Anxiety   . Arthritis   . Constipation    uses stool softener with stimulant  . Coronary artery disease   . Depression   . Headache    last h/a  2001  . Hyperlipidemia   . Hypertension   . Irregular heart beat    hx - no problems since taking acebutolol  . Myocardial infarction [facility]) 2014   stent  . Psoriasis   . SVD (spontaneous vaginal delivery)    x 3    Past Surgical History:  Procedure Laterality Date  . APPENDECTOMY  05  . CARDIAC CATHETERIZATION  2014   stent  . CARDIOVASCULAR STRESS TEST  03/07/15  . COLONOSCOPY    . CORONARY STENT PLACEMENT    . DILATION AND CURETTAGE OF UTERUS    . LEFT HEART CATH AND CORONARY ANGIOGRAPHY N/A 12/10/2018   Procedure: LEFT HEART CATH AND CORONARY ANGIOGRAPHY;  Surgeon: Belva Crome, MD;  Location: [place] [facility];  Service: Cardiovascular;  Laterality: N/A;  . MAXIMUM ACCESS (MAS)POSTERIOR LUMBAR INTERBODY FUSION (PLIF) 1 LEVEL N/A 03/20/2015   Procedure: FOR MAXIMUM ACCESS (MAS) POSTERIOR LUMBAR INTERBODY FUSION (PLIF) 1 LEVEL;  Surgeon: Earnie Larsson, MD;  Location: [place] [facility] OR[facility];  Service: Neurosurgery;  Laterality: N/A;  FOR MAXIMUM ACCESS (MAS) POSTERIOR LUMBAR INTERBODY FUSION (PLIF) 1 LEVEL LUMBAR FOUR-FIVE  . NASAL SEPTUM SURGERY  1985   s/p fall  . SHOULDER ARTHROSCOPY Left 08  . TONSILLECTOMY    . TOOTH EXTRACTION     4 teeth pulled prior to braces as a child  . WISDOM TOOTH EXTRACTION      MEDICATIONS: . Polyethylene Glycol 3350 (MIRALAX PO)  . Psyllium (METAMUCIL PO)  . acebutolol (SECTRAL) 200 MG capsule  . ALPRAZolam (XANAX) 0.5 MG tablet  . amitriptyline (ELAVIL) 50 MG tablet  . aspirin EC 81 MG tablet  . atorvastatin (LIPITOR) 40 MG tablet  .  Calcium Citrate-Vitamin D (CALCIUM + D PO)  . cyclobenzaprine (FLEXERIL) 10 MG tablet  . gabapentin (NEURONTIN) 300 MG capsule  . lactase (LACTAID) 3000 units tablet  . meloxicam (MOBIC) 15 MG tablet  . Multiple Vitamin (MULTIVITAMIN) capsule  . sertraline (ZOLOFT) 100 MG tablet  . traMADol (ULTRAM) 50 MG tablet  . triamcinolone cream (KENALOG) 0.1 %   . 0.9 %  sodium chloride infusion    Myra Gianotti, PA-C Surgical Short Stay/Anesthesiology [facility] Phone [phone] [facility] Phone [phone] 02/18/2019 1:32 PM

## 2019-02-18 NOTE — Anesthesia Preprocedure Evaluation (Addendum)
Anesthesia Evaluation  Patient identified by MRN, date of birth, ID band Patient awake    Reviewed: Allergy & Precautions, H&P , NPO status , Patient's Chart, lab work & pertinent test results  Airway Mallampati: II   Neck ROM: full    Dental   Pulmonary former smoker,    breath sounds clear to auscultation       Cardiovascular hypertension, + CAD, + Past MI and + Cardiac Stents   Rhythm:regular Rate:Normal     Neuro/Psych  Headaches, PSYCHIATRIC DISORDERS Anxiety Depression    GI/Hepatic   Endo/Other    Renal/GU      Musculoskeletal  (+) Arthritis ,   Abdominal   Peds  Hematology   Anesthesia Other Findings   Reproductive/Obstetrics                            Anesthesia Physical Anesthesia Plan  ASA: III  Anesthesia Plan: General   Post-op Pain Management:    Induction: Intravenous  PONV Risk Score and Plan: 3 and Ondansetron, Dexamethasone, Midazolam and Treatment may vary due to age or medical condition  Airway Management Planned: Oral ETT  Additional Equipment:   Intra-op Plan:   Post-operative Plan: Extubation in OR  Informed Consent: I have reviewed the patients History and Physical, chart, labs and discussed the procedure including the risks, benefits and alternatives for the proposed anesthesia with the patient or authorized representative who has indicated his/her understanding and acceptance.       Plan Discussed with: CRNA, Anesthesiologist and Surgeon  Anesthesia Plan Comments: (PAT note written 02/18/2019 by Myra Gianotti, PA-C. )       Anesthesia Quick Evaluation

## 2019-02-22 ENCOUNTER — Other Ambulatory Visit (HOSPITAL_COMMUNITY)
Admission: RE | Admit: 2019-02-22 | Discharge: 2019-02-22 | Disposition: A | Discharge status: Home / Self Care | Payer: Medicare Other | Source: Ambulatory Visit | Attending: Neurosurgery | Admitting: Neurosurgery

## 2019-02-22 DIAGNOSIS — Z1159 Encounter for screening for other viral diseases: Secondary | ICD-10-CM | POA: Diagnosis not present

## 2019-02-24 ENCOUNTER — Encounter (HOSPITAL_COMMUNITY): Payer: Self-pay

## 2019-02-24 ENCOUNTER — Other Ambulatory Visit: Payer: Self-pay

## 2019-02-24 ENCOUNTER — Inpatient Hospital Stay (HOSPITAL_COMMUNITY)
Admission: RE | Admit: 2019-02-24 | Discharge: 2019-03-02 | DRG: 455 | Disposition: A | Discharge status: Home Health | Payer: Medicare Other | Attending: Neurosurgery | Admitting: Neurosurgery

## 2019-02-24 ENCOUNTER — Inpatient Hospital Stay (HOSPITAL_COMMUNITY): Payer: Medicare Other | Admitting: Vascular Surgery

## 2019-02-24 ENCOUNTER — Inpatient Hospital Stay (HOSPITAL_COMMUNITY): Payer: Medicare Other

## 2019-02-24 ENCOUNTER — Encounter (HOSPITAL_COMMUNITY)
Admission: RE | Disposition: A | Discharge status: Home Health | Payer: Self-pay | Source: Home / Self Care | Attending: Neurosurgery

## 2019-02-24 ENCOUNTER — Inpatient Hospital Stay (HOSPITAL_COMMUNITY): Payer: Medicare Other | Admitting: Certified Registered Nurse Anesthetist

## 2019-02-24 DIAGNOSIS — E785 Hyperlipidemia, unspecified: Secondary | ICD-10-CM | POA: Diagnosis present

## 2019-02-24 DIAGNOSIS — R339 Retention of urine, unspecified: Secondary | ICD-10-CM | POA: Diagnosis not present

## 2019-02-24 DIAGNOSIS — Z79891 Long term (current) use of opiate analgesic: Secondary | ICD-10-CM

## 2019-02-24 DIAGNOSIS — I2510 Atherosclerotic heart disease of native coronary artery without angina pectoris: Secondary | ICD-10-CM | POA: Diagnosis present

## 2019-02-24 DIAGNOSIS — Z880 Allergy status to penicillin: Secondary | ICD-10-CM | POA: Diagnosis not present

## 2019-02-24 DIAGNOSIS — Z7982 Long term (current) use of aspirin: Secondary | ICD-10-CM

## 2019-02-24 DIAGNOSIS — M4316 Spondylolisthesis, lumbar region: Principal | ICD-10-CM | POA: Diagnosis present

## 2019-02-24 DIAGNOSIS — Z981 Arthrodesis status: Secondary | ICD-10-CM

## 2019-02-24 DIAGNOSIS — Z8249 Family history of ischemic heart disease and other diseases of the circulatory system: Secondary | ICD-10-CM | POA: Diagnosis not present

## 2019-02-24 DIAGNOSIS — M549 Dorsalgia, unspecified: Secondary | ICD-10-CM

## 2019-02-24 DIAGNOSIS — I10 Essential (primary) hypertension: Secondary | ICD-10-CM | POA: Diagnosis not present

## 2019-02-24 DIAGNOSIS — Z955 Presence of coronary angioplasty implant and graft: Secondary | ICD-10-CM | POA: Diagnosis not present

## 2019-02-24 DIAGNOSIS — M4310 Spondylolisthesis, site unspecified: Secondary | ICD-10-CM

## 2019-02-24 DIAGNOSIS — Z888 Allergy status to other drugs, medicaments and biological substances status: Secondary | ICD-10-CM | POA: Diagnosis not present

## 2019-02-24 DIAGNOSIS — F329 Major depressive disorder, single episode, unspecified: Secondary | ICD-10-CM | POA: Diagnosis present

## 2019-02-24 DIAGNOSIS — M47896 Other spondylosis, lumbar region: Secondary | ICD-10-CM | POA: Diagnosis present

## 2019-02-24 DIAGNOSIS — M4326 Fusion of spine, lumbar region: Secondary | ICD-10-CM | POA: Diagnosis not present

## 2019-02-24 DIAGNOSIS — Z801 Family history of malignant neoplasm of trachea, bronchus and lung: Secondary | ICD-10-CM

## 2019-02-24 DIAGNOSIS — Z419 Encounter for procedure for purposes other than remedying health state, unspecified: Secondary | ICD-10-CM

## 2019-02-24 DIAGNOSIS — Z87891 Personal history of nicotine dependence: Secondary | ICD-10-CM | POA: Diagnosis not present

## 2019-02-24 DIAGNOSIS — I959 Hypotension, unspecified: Secondary | ICD-10-CM | POA: Diagnosis not present

## 2019-02-24 DIAGNOSIS — Z79899 Other long term (current) drug therapy: Secondary | ICD-10-CM | POA: Diagnosis not present

## 2019-02-24 DIAGNOSIS — I252 Old myocardial infarction: Secondary | ICD-10-CM

## 2019-02-24 DIAGNOSIS — Z791 Long term (current) use of non-steroidal anti-inflammatories (NSAID): Secondary | ICD-10-CM | POA: Diagnosis not present

## 2019-02-24 DIAGNOSIS — F419 Anxiety disorder, unspecified: Secondary | ICD-10-CM | POA: Diagnosis present

## 2019-02-24 DIAGNOSIS — M47816 Spondylosis without myelopathy or radiculopathy, lumbar region: Secondary | ICD-10-CM | POA: Diagnosis not present

## 2019-02-24 DIAGNOSIS — Z882 Allergy status to sulfonamides status: Secondary | ICD-10-CM | POA: Diagnosis not present

## 2019-02-24 DIAGNOSIS — M9983 Other biomechanical lesions of lumbar region: Secondary | ICD-10-CM | POA: Diagnosis not present

## 2019-02-24 DIAGNOSIS — Z8349 Family history of other endocrine, nutritional and metabolic diseases: Secondary | ICD-10-CM

## 2019-02-24 DIAGNOSIS — M1990 Unspecified osteoarthritis, unspecified site: Secondary | ICD-10-CM | POA: Diagnosis not present

## 2019-02-24 DIAGNOSIS — M545 Low back pain: Secondary | ICD-10-CM | POA: Diagnosis not present

## 2019-02-24 DIAGNOSIS — M5136 Other intervertebral disc degeneration, lumbar region: Secondary | ICD-10-CM | POA: Diagnosis not present

## 2019-02-24 DIAGNOSIS — M48061 Spinal stenosis, lumbar region without neurogenic claudication: Secondary | ICD-10-CM | POA: Diagnosis not present

## 2019-02-24 LAB — NOVEL CORONAVIRUS, NAA (HOSP ORDER, SEND-OUT TO REF LAB; TAT 18-24 HRS): SARS-CoV-2, NAA: NOT DETECTED

## 2019-02-24 SURGERY — POSTERIOR LUMBAR FUSION 2 LEVEL
Anesthesia: General | Site: Back

## 2019-02-24 MED ORDER — OXYCODONE HCL 5 MG PO TABS
10.0000 mg | ORAL_TABLET | ORAL | Status: DC | PRN
  Administered 2019-02-24 – 2019-03-01 (×12): 10 mg via ORAL
  Filled 2019-02-24 (×12): qty 2

## 2019-02-24 MED ORDER — ACETAMINOPHEN 650 MG RE SUPP: 650.0000 mg | RECTAL | Status: DC | PRN

## 2019-02-24 MED ORDER — SODIUM CHLORIDE 0.9% FLUSH: 3.0000 mL | INTRAVENOUS | Status: DC | PRN

## 2019-02-24 MED ORDER — THROMBIN 20000 UNITS EX SOLR
CUTANEOUS | Status: AC
  Filled 2019-02-24: qty 20000

## 2019-02-24 MED ORDER — MIDAZOLAM HCL 2 MG/2ML IJ SOLN
INTRAMUSCULAR | Status: AC
  Filled 2019-02-24: qty 2

## 2019-02-24 MED ORDER — LACTATED RINGERS IV SOLN
INTRAVENOUS | Status: DC
  Administered 2019-02-24 (×2): via INTRAVENOUS

## 2019-02-24 MED ORDER — OXYCODONE HCL 5 MG/5ML PO SOLN: 5.0000 mg | Freq: Once | ORAL | Status: DC | PRN

## 2019-02-24 MED ORDER — LIDOCAINE 2% (20 MG/ML) 5 ML SYRINGE
INTRAMUSCULAR | Status: AC
  Filled 2019-02-24: qty 5

## 2019-02-24 MED ORDER — POLYETHYLENE GLYCOL 3350 17 G PO PACK
17.0000 g | PACK | Freq: Every day | ORAL | Status: DC | PRN
  Administered 2019-02-26: 17 g via ORAL
  Filled 2019-02-24: qty 1

## 2019-02-24 MED ORDER — SUCCINYLCHOLINE CHLORIDE 200 MG/10ML IV SOSY
PREFILLED_SYRINGE | INTRAVENOUS | Status: DC | PRN
  Administered 2019-02-24: 60 mg via INTRAVENOUS

## 2019-02-24 MED ORDER — CYCLOBENZAPRINE HCL 10 MG PO TABS
10.0000 mg | ORAL_TABLET | Freq: Three times a day (TID) | ORAL | Status: DC
  Administered 2019-02-24 – 2019-03-02 (×16): 10 mg via ORAL
  Filled 2019-02-24 (×16): qty 1

## 2019-02-24 MED ORDER — BUPIVACAINE HCL (PF) 0.25 % IJ SOLN
INTRAMUSCULAR | Status: DC | PRN
  Administered 2019-02-24: 30 mL

## 2019-02-24 MED ORDER — ALBUMIN HUMAN 5 % IV SOLN
INTRAVENOUS | Status: DC | PRN
  Administered 2019-02-24: 15:00:00 via INTRAVENOUS

## 2019-02-24 MED ORDER — MELOXICAM 7.5 MG PO TABS
15.0000 mg | ORAL_TABLET | Freq: Every day | ORAL | Status: DC
  Administered 2019-02-24 – 2019-03-02 (×7): 15 mg via ORAL
  Filled 2019-02-24 (×7): qty 2

## 2019-02-24 MED ORDER — ONDANSETRON HCL 4 MG PO TABS: 4.0000 mg | ORAL_TABLET | Freq: Four times a day (QID) | ORAL | Status: DC | PRN

## 2019-02-24 MED ORDER — HYDROCODONE-ACETAMINOPHEN 10-325 MG PO TABS
1.0000 | ORAL_TABLET | ORAL | Status: DC | PRN
  Administered 2019-02-25 – 2019-03-02 (×8): 1 via ORAL
  Filled 2019-02-24 (×8): qty 1

## 2019-02-24 MED ORDER — TAB-A-VITE/IRON PO TABS
1.0000 | ORAL_TABLET | Freq: Every day | ORAL | Status: DC
  Administered 2019-02-24 – 2019-03-02 (×7): 1 via ORAL
  Filled 2019-02-24 (×7): qty 1

## 2019-02-24 MED ORDER — ONDANSETRON HCL 4 MG/2ML IJ SOLN: 4.0000 mg | Freq: Four times a day (QID) | INTRAMUSCULAR | Status: DC | PRN

## 2019-02-24 MED ORDER — ACEBUTOLOL HCL 200 MG PO CAPS
200.0000 mg | ORAL_CAPSULE | Freq: Every day | ORAL | Status: DC
  Administered 2019-02-24 – 2019-03-02 (×6): 200 mg via ORAL
  Filled 2019-02-24 (×7): qty 1

## 2019-02-24 MED ORDER — SODIUM CHLORIDE 0.9 % IV SOLN
INTRAVENOUS | Status: DC | PRN
  Administered 2019-02-24: 20 ug/min via INTRAVENOUS

## 2019-02-24 MED ORDER — FENTANYL CITRATE (PF) 250 MCG/5ML IJ SOLN
INTRAMUSCULAR | Status: AC
  Filled 2019-02-24: qty 5

## 2019-02-24 MED ORDER — ALPRAZOLAM 0.5 MG PO TABS
1.0000 mg | ORAL_TABLET | Freq: Every day | ORAL | Status: DC
  Administered 2019-02-24 – 2019-03-01 (×6): 1 mg via ORAL
  Filled 2019-02-24 (×6): qty 2

## 2019-02-24 MED ORDER — ONDANSETRON HCL 4 MG/2ML IJ SOLN
INTRAMUSCULAR | Status: AC
  Filled 2019-02-24: qty 2

## 2019-02-24 MED ORDER — SODIUM CHLORIDE 0.9 % IV SOLN
INTRAVENOUS | Status: DC | PRN
  Administered 2019-02-24: 14:00:00

## 2019-02-24 MED ORDER — PHENOL 1.4 % MT LIQD: 1.0000 | OROMUCOSAL | Status: DC | PRN

## 2019-02-24 MED ORDER — 0.9 % SODIUM CHLORIDE (POUR BTL) OPTIME
TOPICAL | Status: DC | PRN
  Administered 2019-02-24: 14:00:00 1000 mL

## 2019-02-24 MED ORDER — PROPOFOL 10 MG/ML IV BOLUS
INTRAVENOUS | Status: AC
  Filled 2019-02-24: qty 20

## 2019-02-24 MED ORDER — VANCOMYCIN HCL 1000 MG IV SOLR
INTRAVENOUS | Status: AC
  Filled 2019-02-24: qty 1000

## 2019-02-24 MED ORDER — ROCURONIUM BROMIDE 10 MG/ML (PF) SYRINGE
PREFILLED_SYRINGE | INTRAVENOUS | Status: DC | PRN
  Administered 2019-02-24: 50 mg via INTRAVENOUS
  Administered 2019-02-24: 25 mg via INTRAVENOUS
  Administered 2019-02-24: 15 mg via INTRAVENOUS

## 2019-02-24 MED ORDER — MIDAZOLAM HCL 5 MG/5ML IJ SOLN
INTRAMUSCULAR | Status: DC | PRN
  Administered 2019-02-24: 2 mg via INTRAVENOUS

## 2019-02-24 MED ORDER — FLEET ENEMA 7-19 GM/118ML RE ENEM: 1.0000 | ENEMA | Freq: Once | RECTAL | Status: DC | PRN

## 2019-02-24 MED ORDER — CEFAZOLIN SODIUM-DEXTROSE 1-4 GM/50ML-% IV SOLN
1.0000 g | Freq: Three times a day (TID) | INTRAVENOUS | Status: AC
  Administered 2019-02-24 – 2019-02-25 (×2): 1 g via INTRAVENOUS
  Filled 2019-02-24 (×2): qty 50

## 2019-02-24 MED ORDER — MENTHOL 3 MG MT LOZG: 1.0000 | LOZENGE | OROMUCOSAL | Status: DC | PRN

## 2019-02-24 MED ORDER — CHLORHEXIDINE GLUCONATE CLOTH 2 % EX PADS: 6.0000 | MEDICATED_PAD | Freq: Once | CUTANEOUS | Status: DC

## 2019-02-24 MED ORDER — BISACODYL 10 MG RE SUPP: 10.0000 mg | Freq: Every day | RECTAL | Status: DC | PRN

## 2019-02-24 MED ORDER — TRAMADOL HCL 50 MG PO TABS: 50.0000 mg | ORAL_TABLET | Freq: Two times a day (BID) | ORAL | Status: DC | PRN

## 2019-02-24 MED ORDER — CALCIUM-VITAMIN D-VITAMIN K 500-1000-40 MG-UNT-MCG PO CHEW: CHEWABLE_TABLET | Freq: Every day | ORAL | Status: DC

## 2019-02-24 MED ORDER — PSYLLIUM 95 % PO PACK
1.0000 | PACK | Freq: Every day | ORAL | Status: DC | PRN
  Filled 2019-02-24: qty 1

## 2019-02-24 MED ORDER — SUGAMMADEX SODIUM 200 MG/2ML IV SOLN
INTRAVENOUS | Status: DC | PRN
  Administered 2019-02-24: 200 mg via INTRAVENOUS

## 2019-02-24 MED ORDER — GABAPENTIN 400 MG PO CAPS
1200.0000 mg | ORAL_CAPSULE | Freq: Every day | ORAL | Status: DC
  Administered 2019-02-24 – 2019-03-01 (×6): 1200 mg via ORAL
  Filled 2019-02-24 (×6): qty 3

## 2019-02-24 MED ORDER — DEXAMETHASONE SODIUM PHOSPHATE 10 MG/ML IJ SOLN
INTRAMUSCULAR | Status: DC | PRN
  Administered 2019-02-24: 10 mg via INTRAVENOUS

## 2019-02-24 MED ORDER — OXYCODONE HCL 5 MG PO TABS: 5.0000 mg | ORAL_TABLET | Freq: Once | ORAL | Status: DC | PRN

## 2019-02-24 MED ORDER — ACETAMINOPHEN 325 MG PO TABS
650.0000 mg | ORAL_TABLET | ORAL | Status: DC | PRN
  Administered 2019-02-25 – 2019-02-27 (×2): 650 mg via ORAL
  Filled 2019-02-24 (×3): qty 2

## 2019-02-24 MED ORDER — SODIUM CHLORIDE 0.9 % IV SOLN
250.0000 mL | INTRAVENOUS | Status: DC
  Administered 2019-02-24: 250 mL via INTRAVENOUS

## 2019-02-24 MED ORDER — DEXAMETHASONE SODIUM PHOSPHATE 10 MG/ML IJ SOLN
10.0000 mg | INTRAMUSCULAR | Status: DC
  Filled 2019-02-24: qty 1

## 2019-02-24 MED ORDER — BUPIVACAINE HCL (PF) 0.25 % IJ SOLN
INTRAMUSCULAR | Status: AC
  Filled 2019-02-24: qty 30

## 2019-02-24 MED ORDER — FENTANYL CITRATE (PF) 100 MCG/2ML IJ SOLN
25.0000 ug | INTRAMUSCULAR | Status: DC | PRN
  Administered 2019-02-24: 50 ug via INTRAVENOUS
  Administered 2019-02-24 (×2): 25 ug via INTRAVENOUS

## 2019-02-24 MED ORDER — PROPOFOL 10 MG/ML IV BOLUS
INTRAVENOUS | Status: DC | PRN
  Administered 2019-02-24: 120 mg via INTRAVENOUS

## 2019-02-24 MED ORDER — SODIUM CHLORIDE 0.9% FLUSH
3.0000 mL | Freq: Two times a day (BID) | INTRAVENOUS | Status: DC
  Administered 2019-02-25 – 2019-03-02 (×9): 3 mL via INTRAVENOUS

## 2019-02-24 MED ORDER — ONDANSETRON HCL 4 MG/2ML IJ SOLN
INTRAMUSCULAR | Status: DC | PRN
  Administered 2019-02-24: 4 mg via INTRAVENOUS

## 2019-02-24 MED ORDER — VANCOMYCIN HCL IN DEXTROSE 1-5 GM/200ML-% IV SOLN
1000.0000 mg | INTRAVENOUS | Status: AC
  Administered 2019-02-24: 1000 mg via INTRAVENOUS
  Filled 2019-02-24: qty 200

## 2019-02-24 MED ORDER — DIPHENHYDRAMINE HCL 50 MG/ML IJ SOLN
INTRAMUSCULAR | Status: DC | PRN
  Administered 2019-02-24: 12.5 mg via INTRAVENOUS

## 2019-02-24 MED ORDER — HYDROMORPHONE HCL 1 MG/ML IJ SOLN
1.0000 mg | INTRAMUSCULAR | Status: DC | PRN
  Administered 2019-02-24 – 2019-02-27 (×3): 1 mg via INTRAVENOUS
  Filled 2019-02-24 (×3): qty 1

## 2019-02-24 MED ORDER — FENTANYL CITRATE (PF) 100 MCG/2ML IJ SOLN
INTRAMUSCULAR | Status: AC
  Filled 2019-02-24: qty 2

## 2019-02-24 MED ORDER — ATORVASTATIN CALCIUM 40 MG PO TABS
40.0000 mg | ORAL_TABLET | Freq: Every evening | ORAL | Status: DC
  Administered 2019-02-24 – 2019-03-01 (×6): 40 mg via ORAL
  Filled 2019-02-24 (×6): qty 1

## 2019-02-24 MED ORDER — DEXAMETHASONE SODIUM PHOSPHATE 10 MG/ML IJ SOLN
INTRAMUSCULAR | Status: AC
  Filled 2019-02-24: qty 1

## 2019-02-24 MED ORDER — LIDOCAINE 2% (20 MG/ML) 5 ML SYRINGE
INTRAMUSCULAR | Status: DC | PRN
  Administered 2019-02-24: 60 mg via INTRAVENOUS

## 2019-02-24 MED ORDER — DIAZEPAM 5 MG PO TABS
5.0000 mg | ORAL_TABLET | Freq: Four times a day (QID) | ORAL | Status: DC | PRN
  Administered 2019-02-25: 5 mg via ORAL
  Administered 2019-02-25 – 2019-02-26 (×2): 10 mg via ORAL
  Administered 2019-02-26: 5 mg via ORAL
  Administered 2019-02-27 (×2): 10 mg via ORAL
  Filled 2019-02-24 (×2): qty 2
  Filled 2019-02-24 (×2): qty 1
  Filled 2019-02-24 (×2): qty 2

## 2019-02-24 MED ORDER — ROCURONIUM BROMIDE 10 MG/ML (PF) SYRINGE
PREFILLED_SYRINGE | INTRAVENOUS | Status: AC
  Filled 2019-02-24: qty 30

## 2019-02-24 MED ORDER — THROMBIN 20000 UNITS EX SOLR
CUTANEOUS | Status: DC | PRN
  Administered 2019-02-24: 14:00:00 via TOPICAL

## 2019-02-24 MED ORDER — ALPRAZOLAM 0.5 MG PO TABS
0.5000 mg | ORAL_TABLET | Freq: Every morning | ORAL | Status: DC
  Administered 2019-02-25 – 2019-03-02 (×4): 0.5 mg via ORAL
  Filled 2019-02-24 (×4): qty 1

## 2019-02-24 MED ORDER — VANCOMYCIN HCL 1 G IV SOLR
INTRAVENOUS | Status: DC | PRN
  Administered 2019-02-24: 1000 mg via TOPICAL

## 2019-02-24 MED ORDER — CALCIUM CARBONATE-VITAMIN D 500-200 MG-UNIT PO TABS
1.0000 | ORAL_TABLET | Freq: Every day | ORAL | Status: DC
  Administered 2019-02-25 – 2019-03-02 (×6): 1 via ORAL
  Filled 2019-02-24 (×6): qty 1

## 2019-02-24 MED ORDER — FENTANYL CITRATE (PF) 250 MCG/5ML IJ SOLN
INTRAMUSCULAR | Status: DC | PRN
  Administered 2019-02-24 (×5): 50 ug via INTRAVENOUS

## 2019-02-24 MED ORDER — SERTRALINE HCL 100 MG PO TABS
100.0000 mg | ORAL_TABLET | Freq: Every day | ORAL | Status: DC
  Administered 2019-02-24 – 2019-03-02 (×7): 100 mg via ORAL
  Filled 2019-02-24 (×7): qty 1

## 2019-02-24 MED ORDER — AMITRIPTYLINE HCL 25 MG PO TABS
50.0000 mg | ORAL_TABLET | Freq: Every day | ORAL | Status: DC
  Administered 2019-02-24 – 2019-03-01 (×6): 50 mg via ORAL
  Filled 2019-02-24 (×6): qty 2

## 2019-02-24 MED ORDER — LACTASE 3000 UNITS PO TABS
6000.0000 [IU] | ORAL_TABLET | Freq: Every day | ORAL | Status: DC
  Administered 2019-02-24 – 2019-03-01 (×6): 6000 [IU] via ORAL
  Filled 2019-02-24 (×7): qty 2

## 2019-02-24 SURGICAL SUPPLY — 71 items
BAG DECANTER FOR FLEXI CONT (MISCELLANEOUS) ×2 IMPLANT
BENZOIN TINCTURE PRP APPL 2/3 (GAUZE/BANDAGES/DRESSINGS) ×2 IMPLANT
BIT DRILL PLIF MAS DISP 5.5MM (DRILL) ×1 IMPLANT
BUR CUTTER 7.0 ROUND (BURR) IMPLANT
BUR MATCHSTICK NEURO 3.0 LAGG (BURR) ×2 IMPLANT
CANISTER SUCT 3000ML PPV (MISCELLANEOUS) ×2 IMPLANT
CARTRIDGE OIL MAESTRO DRILL (MISCELLANEOUS) ×1 IMPLANT
CLIP NEUROVISION LG (CLIP) ×2 IMPLANT
CONT SPEC 4OZ CLIKSEAL STRL BL (MISCELLANEOUS) ×2 IMPLANT
COVER BACK TABLE 60X90IN (DRAPES) ×2 IMPLANT
DECANTER SPIKE VIAL GLASS SM (MISCELLANEOUS) ×2 IMPLANT
DERMABOND ADVANCED (GAUZE/BANDAGES/DRESSINGS) ×1
DERMABOND ADVANCED .7 DNX12 (GAUZE/BANDAGES/DRESSINGS) ×1 IMPLANT
DEVICE INTERBODY ELEVATE 23X8 (Cage) ×4 IMPLANT
DIFFUSER DRILL AIR PNEUMATIC (MISCELLANEOUS) ×2 IMPLANT
DRAPE C-ARM 42X72 X-RAY (DRAPES) ×2 IMPLANT
DRAPE C-ARMOR (DRAPES) ×2 IMPLANT
DRAPE HALF SHEET 40X57 (DRAPES) IMPLANT
DRAPE LAPAROTOMY 100X72X124 (DRAPES) ×2 IMPLANT
DRAPE SURG 17X23 STRL (DRAPES) ×8 IMPLANT
DRILL PLIF MAS DISP 5.5MM (DRILL) ×2
DRSG OPSITE POSTOP 4X6 (GAUZE/BANDAGES/DRESSINGS) ×2 IMPLANT
DURAPREP 26ML APPLICATOR (WOUND CARE) ×2 IMPLANT
ELECT REM PT RETURN 9FT ADLT (ELECTROSURGICAL) ×2
ELECTRODE REM PT RTRN 9FT ADLT (ELECTROSURGICAL) ×1 IMPLANT
EVACUATOR 1/8 PVC DRAIN (DRAIN) IMPLANT
GAUZE 4X4 16PLY RFD (DISPOSABLE) IMPLANT
GAUZE SPONGE 4X4 12PLY STRL (GAUZE/BANDAGES/DRESSINGS) IMPLANT
GLOVE BIO SURGEON STRL SZ 6.5 (GLOVE) ×2 IMPLANT
GLOVE BIO SURGEON STRL SZ7.5 (GLOVE) ×2 IMPLANT
GLOVE BIOGEL PI IND STRL 6.5 (GLOVE) ×1 IMPLANT
GLOVE BIOGEL PI IND STRL 7.0 (GLOVE) ×3 IMPLANT
GLOVE BIOGEL PI IND STRL 7.5 (GLOVE) ×2 IMPLANT
GLOVE BIOGEL PI INDICATOR 6.5 (GLOVE) ×1
GLOVE BIOGEL PI INDICATOR 7.0 (GLOVE) ×3
GLOVE BIOGEL PI INDICATOR 7.5 (GLOVE) ×2
GLOVE ECLIPSE 9.0 STRL (GLOVE) ×4 IMPLANT
GLOVE EXAM NITRILE XL STR (GLOVE) IMPLANT
GOWN STRL REUS W/ TWL LRG LVL3 (GOWN DISPOSABLE) ×2 IMPLANT
GOWN STRL REUS W/ TWL XL LVL3 (GOWN DISPOSABLE) ×4 IMPLANT
GOWN STRL REUS W/TWL 2XL LVL3 (GOWN DISPOSABLE) IMPLANT
GOWN STRL REUS W/TWL LRG LVL3 (GOWN DISPOSABLE) ×2
GOWN STRL REUS W/TWL XL LVL3 (GOWN DISPOSABLE) ×4
GRAFT BN 10X1XDBM MAGNIFUSE (Bone Implant) ×1 IMPLANT
GRAFT BONE MAGNIFUSE 1X10CM (Bone Implant) ×1 IMPLANT
KIT BASIN OR (CUSTOM PROCEDURE TRAY) ×2 IMPLANT
KIT TURNOVER KIT B (KITS) ×2 IMPLANT
MILL MEDIUM DISP (BLADE) ×2 IMPLANT
MODULE NVM5 NEXT GEN EMG (NEEDLE) ×2 IMPLANT
NEEDLE HYPO 22GX1.5 SAFETY (NEEDLE) ×2 IMPLANT
NS IRRIG 1000ML POUR BTL (IV SOLUTION) ×2 IMPLANT
OIL CARTRIDGE MAESTRO DRILL (MISCELLANEOUS) ×2
PACK LAMINECTOMY NEURO (CUSTOM PROCEDURE TRAY) ×2 IMPLANT
ROD ARM15T 100MM (Rod) ×4 IMPLANT
SCREW LOCK (Screw) ×8 IMPLANT
SCREW LOCK FXNS SPNE MAS PL (Screw) ×8 IMPLANT
SCREW SHANK 5.5X30MM (Screw) ×8 IMPLANT
SCREW TULIP 5.5 (Screw) ×8 IMPLANT
SPACER SPNL STD 23X8XSTRL (Cage) ×4 IMPLANT
SPCR SPNL STD 23X8XSTRL (Cage) ×4 IMPLANT
SPONGE LAP 4X18 RFD (DISPOSABLE) ×2 IMPLANT
SPONGE SURGIFOAM ABS GEL 100 (HEMOSTASIS) ×2 IMPLANT
STRIP CLOSURE SKIN 1/2X4 (GAUZE/BANDAGES/DRESSINGS) ×2 IMPLANT
SUT VIC AB 0 CT1 18XCR BRD8 (SUTURE) ×1 IMPLANT
SUT VIC AB 0 CT1 8-18 (SUTURE) ×1
SUT VIC AB 2-0 CT1 18 (SUTURE) ×2 IMPLANT
SUT VIC AB 3-0 SH 8-18 (SUTURE) ×4 IMPLANT
TOWEL GREEN STERILE (TOWEL DISPOSABLE) ×2 IMPLANT
TOWEL GREEN STERILE FF (TOWEL DISPOSABLE) ×2 IMPLANT
TRAY FOLEY MTR SLVR 16FR STAT (SET/KITS/TRAYS/PACK) ×2 IMPLANT
WATER STERILE IRR 1000ML POUR (IV SOLUTION) ×2 IMPLANT

## 2019-02-24 NOTE — Brief Op Note (Signed)
02/24/2019  4:24 PM  PATIENT:  Carol Love  68 y.o. female  PRE-OPERATIVE DIAGNOSIS:  Spondylolisthesis  POST-OPERATIVE DIAGNOSIS:  Spondylolisthesis  PROCEDURE:  Procedure(s): posterior lumber interbody fusion - Lumbar two-Lumbar three - Lumbar three-Lumbar four (N/A)  SURGEON:  Surgeon(s) and Role:    Earnie Larsson, MD - Primary  PHYSICIAN ASSISTANT:   ASSISTANTS: Maryan Rued, NP  ANESTHESIA:   general  EBL:  200 mL   BLOOD ADMINISTERED:none  DRAINS: none   LOCAL MEDICATIONS USED:  MARCAINE     SPECIMEN:  No Specimen  DISPOSITION OF SPECIMEN:  N/A  COUNTS:  YES  TOURNIQUET:  * No tourniquets in log *  DICTATION: .Dragon Dictation  PLAN OF CARE: Admit to inpatient   PATIENT DISPOSITION:  PACU - hemodynamically stable.   Delay start of Pharmacological VTE agent (>24hrs) due to surgical blood loss or risk of bleeding: yes

## 2019-02-24 NOTE — Anesthesia Procedure Notes (Signed)
Procedure Name: Intubation Performed by: Milford Cage, CRNA Pre-anesthesia Checklist: Patient identified, Emergency Drugs available, Suction available and Patient being monitored Patient Re-evaluated:Patient Re-evaluated prior to induction Oxygen Delivery Method: Circle System Utilized Preoxygenation: Pre-oxygenation with 100% oxygen Induction Type: IV induction Ventilation: Mask ventilation without difficulty Laryngoscope Size: Mac and 3 Grade View: Grade II Tube type: Oral Tube size: 6.5 mm Number of attempts: 1 Airway Equipment and Method: Stylet and Oral airway Placement Confirmation: ETT inserted through vocal cords under direct vision,  positive ETCO2 and breath sounds checked- equal and bilateral Secured at: 20 cm Tube secured with: Tape Dental Injury: Teeth and Oropharynx as per pre-operative assessment

## 2019-02-24 NOTE — Op Note (Signed)
Date of procedure: 02/24/2019  Date of dictation: Same  Service: Neurosurgery  Preoperative diagnosis: L2-3, L3-4 degenerative spondylolisthesis with severe facet arthropathy and foraminal stenosis, status post prior L4-5 decompression and fusion.  Postoperative diagnosis: Same  Procedure Name: Bilateral L2-3, L3-4 decompressive laminotomies and foraminotomies, more than would be required for simple interbody fusion alone.  L2-3, L3-4 posterior lumbar interbody fusion utilizing interbody cage and locally harvested autograft  L2-3-4 5 posterior lateral arthrodesis utilizing segmental pedicle screw fixation and local autografting.  Surgeon:Antawn Sison A.Nissa Stannard, M.D.  Asst. Surgeon: Venetia Constable, MD    Reinaldo Meeker, NP  Anesthesia: General  Indication: 68 year old female status post prior L4-5 decompression and fusion with reasonably good results presents now with worsening back and lower extremity pain failing conservative management.  Work-up demonstrates evidence of significant disc degeneration with associated facet arthropathy with marked facet joint diastases and retrolisthesis at L2-3 and L3-4.  Patient is failed conservative management presents now for decompression and fusion L2-3 and L3-4.  Operative note: After induction anesthesia, patient position prone onto Wilson frame and appropriate padded.  Lumbar region prepped and draped sterilely.  Incision made extending from L2-L5.  Dissection performed bilaterally.  Retractor placed.  Fluoroscopy used.  Levels confirmed.  Previously placed cortical pedicle screw instrumentation at L4 and L5 was disassembled.  Fusion was inspected and found to be solid.  Hardware was all solid.  Utilizing AP and lateral fluoroscopic guidance pilot holes were then drilled in the inferior medial aspect of the pedicles of L2 and L3 bilaterally.  Cortical pedicle screw trajectory both superiorly and laterally was then maintained with the pilot drill under continuous neuro  monitoring.  Neuro monitoring confirmed safe control passage with no violation.  The pilot hole was probed and found to be solid within bone.  The screw pilot hole was then tapped with a screw tapped also using neural monitoring.  Cortical shanks were then placed at L2 and L3 bilaterally utilizing the [employer] system.  Bilateral decompressive laminotomies and facetectomies were then performed at L2-3 and L3-4.  Inferior aspect lamina of L2 the complete inferior facet of L2 and the superior aspect of the lamina and the majority the superior facet of L3 were all resected.  Ligament flavum elevated and resected.  Wide decompressive foraminotomies were completed on the course the exiting L2 and L3 nerve roots.  Procedure was then repeated at L3-4.  Bilateral discectomies performed at L2-3 and L3-4.  The space then prepared for interbody fusion.  With a distractor placed the patient's right side to space was cleaned of soft tissue.  A 8 mm expandable Medtronic cage packed with locally harvested autograft was then impacted in the place and expanded to its full extent.  Distractor removed patient's right side.  The space prepared for interbody fusion on the right side.  Morselized autograft packed in the interspace.  A second cage was then impacted in place and expanded to its full extent.  Procedure was then repeated at L3-4 again without complication and a good with good positioning of the interbody cages.  Final images reveal good position of the screws and the cages at the proper upper levels with normal alignment of the spine.  Caps were then placed on the screw Shanks.  A lordotic titanium rod was then placed over the screw heads from L2-L5.  Locking caps were placed over the screws.  Locking caps then sequentially engaged.  Residual facets and transverse processes were decorticated.  Morselized autograft and allograft packets were packed bilaterally for  later posterior lateral fusion.  Gelfoam was placed over the  laminotomy defects.  Vancomycin powder was placed in the deep wound space.  Wound is then closed in layers with Vicryl sutures.  Steri-Strips and sterile dressing were applied.  No apparent complications.  Patient tolerated the procedure well and she returned to the recovery room postop.

## 2019-02-24 NOTE — H&P (Signed)
Carol Love is an 68 y.o. female.   Chief Complaint: Back pain HPI: 67 year old female with prior history of lumbar decompression and fusion L4-5 presents with worsening back pain with radiation to both lower extremities right greater than left.  Patient is failed conservative management.  Work-up demonstrates evidence of severe facet joint diastases and subsequent instability at L2-3 and L3-4.  Patient presents now for L3-4 and L2-3 decompression and fusion in hopes of improving her symptoms.  Past Medical History:  Diagnosis Date  . Anxiety   . Arthritis   . Constipation    uses stool softener with stimulant  . Coronary artery disease   . Depression   . Headache    last h/a  2001  . Hyperlipidemia   . Hypertension   . Irregular heart beat    hx - no problems since taking acebutolol  . Myocardial infarction [facility]) 2014   stent  . Psoriasis   . SVD (spontaneous vaginal delivery)    x 3    Past Surgical History:  Procedure Laterality Date  . APPENDECTOMY  05  . CARDIAC CATHETERIZATION  2014   stent  . CARDIOVASCULAR STRESS TEST  03/07/15  . COLONOSCOPY    . CORONARY ANGIOPLASTY     DES RCA 12/13/12  . CORONARY STENT PLACEMENT    . DILATION AND CURETTAGE OF UTERUS    . LEFT HEART CATH AND CORONARY ANGIOGRAPHY N/A 12/10/2018   Procedure: LEFT HEART CATH AND CORONARY ANGIOGRAPHY;  Surgeon: Belva Crome, MD;  Location: [place] [facility];  Service: Cardiovascular;  Laterality: N/A;  . MAXIMUM ACCESS (MAS)POSTERIOR LUMBAR INTERBODY FUSION (PLIF) 1 LEVEL N/A 03/20/2015   Procedure: FOR MAXIMUM ACCESS (MAS) POSTERIOR LUMBAR INTERBODY FUSION (PLIF) 1 LEVEL;  Surgeon: Earnie Larsson, MD;  Location: [place] NEUR[facility] ORS;  Service: Neurosurgery;  Laterality: N/A;  FOR MAXIMUM ACCESS (MAS) POSTERIOR LUMBAR INTERBODY FUSION (PLIF) 1 LEVEL LUMBAR FOUR-FIVE  . NASAL SEPTUM SURGERY  1985   s/p fall  . SHOULDER ARTHROSCOPY Left 08  . TONSILLECTOMY    . TOOTH EXTRACTION     4 teeth pulled prior to  braces as a child  . WISDOM TOOTH EXTRACTION      Family History  Problem Relation Age of Onset  . Heart disease Mother   . Hyperlipidemia Mother   . AAA (abdominal aortic aneurysm) Mother   . Lung cancer Mother   . Heart disease Father   . Colon cancer Neg Hx   . Colon polyps Neg Hx   . Esophageal cancer Neg Hx   . Rectal cancer Neg Hx   . Stomach cancer Neg Hx    Social History:  reports that she quit smoking about 6 years ago. Her smoking use included cigarettes. She has a 22.50 pack-year smoking history. She has never used smokeless tobacco. She reports current alcohol use of about 4.0 - 6.0 standard drinks of alcohol per week. She reports that she does not use drugs.  Allergies:  Allergies  Allergen Reactions  . Epinephrine Other (See Comments)    Gets migraines  . Ibuprofen Itching and Swelling  . Penicillins Rash    Did it involve swelling of the face/tongue/throat, SOB, or low BP? Unknown Did it involve sudden or severe rash/hives, skin peeling, or any reaction on the inside of your mouth or nose? No Did you need to seek medical attention at a hospital or doctor's office? Unknown When did it last happen?Childhood allergy If all above answers are "NO",  may proceed with cephalosporin use.   . Sulfa Antibiotics Rash    Facility-Administered Medications Prior to Admission  Medication Dose Route Frequency Provider Last Rate Last Dose  . 0.9 %  sodium chloride infusion  500 mL Intravenous Continuous Nandigam, Venia Minks, MD       Medications Prior to Admission  Medication Sig Dispense Refill  . acebutolol (SECTRAL) 200 MG capsule Take 1 capsule (200 mg total) by mouth daily. 30 capsule 9  . ALPRAZolam (XANAX) 0.5 MG tablet Take 0.5-1 mg by mouth See admin instructions. Take 0.5 mg in the morning and 1 mg in the evening  5  . amitriptyline (ELAVIL) 50 MG tablet Take 50 mg by mouth at bedtime.  12  . aspirin EC 81 MG tablet Take 81 mg by mouth every evening.    Marland Kitchen  atorvastatin (LIPITOR) 40 MG tablet Take 1 tablet (40 mg total) by mouth daily. (Patient taking differently: Take 40 mg by mouth every evening. ) 90 tablet 3  . Calcium Citrate-Vitamin D (CALCIUM + D PO) Take 1 tablet by mouth daily.    . cyclobenzaprine (FLEXERIL) 10 MG tablet Take 10 mg by mouth 3 (three) times daily.     Marland Kitchen gabapentin (NEURONTIN) 300 MG capsule Take 1,200 mg by mouth at bedtime.     Marland Kitchen lactase (LACTAID) 3000 units tablet Take 6,000 Units by mouth daily.    . meloxicam (MOBIC) 15 MG tablet Take 15 mg by mouth daily.     . Multiple Vitamin (MULTIVITAMIN) capsule Take 1 capsule by mouth daily.    . Polyethylene Glycol 3350 (MIRALAX PO) Take 1 Scoop by mouth daily as needed. 1 tsp    . Psyllium (METAMUCIL PO) Take 1 Scoop by mouth daily as needed.    . sertraline (ZOLOFT) 100 MG tablet Take 100 mg by mouth daily.  12  . traMADol (ULTRAM) 50 MG tablet Take 50 mg by mouth 2 (two) times daily as needed for moderate pain.     Marland Kitchen triamcinolone cream (KENALOG) 0.1 % Apply 1 application topically daily as needed (contact dermatitis).      No results found for this or any previous visit (from the past 48 hour(s)). No results found.  Pertinent items noted in HPI and remainder of comprehensive ROS otherwise negative.  Blood pressure 131/68, pulse 70, temperature 98.2 F (36.8 C), temperature source Oral, resp. rate 18, height 4' 11.25" (1.505 m), weight 62.6 kg, SpO2 100 %.  Patient is awake and alert.  She is oriented and appropriate.  Cranial nerve function is intact.  Motor and sensory function extremities normal.  Straight raising mildly positive on the right negative on the left.  Lumbar spine diffusely tender.  Wound well-healed.  Examination head ears eyes nose throat summer.  Chest and abdomen are benign.  Extremities are free from injury or deformity. Assessment/Plan L2-3, L3-4 degenerative spondylolisthesis with severe facet arthropathy and intractable back pain.  Plan bilateral  L2-3, L34 decompressive laminotomies and foraminotomies followed by posterior lumbar interbody fusion utilizing interbody cages, locally harvested autograft, and pedicle screw fixation with posterior lateral arthrodesis.  Risks and benefits of been explained.  Patient wishes to proceed.  Mallie Mussel A Juel Bellerose 02/24/2019, 11:41 AM

## 2019-02-24 NOTE — Transfer of Care (Signed)
Immediate Anesthesia Transfer of Care Note  Patient: Carol Love  Procedure(s) Performed: posterior lumber interbody fusion - Lumbar two-Lumbar three - Lumbar three-Lumbar four (N/A Back)  Patient Location: PACU  Anesthesia Type:General  Level of Consciousness: awake, alert  and oriented  Airway & Oxygen Therapy: Patient Spontanous Breathing and Patient connected to face mask oxygen  Post-op Assessment: Report given to RN and Post -op Vital signs reviewed and stable  Post vital signs: Reviewed and stable  Last Vitals:  Vitals Value Taken Time  BP 129/75 02/24/2019  4:33 PM  Temp    Pulse 76 02/24/2019  4:34 PM  Resp 14 02/24/2019  4:34 PM  SpO2 100 % 02/24/2019  4:34 PM  Vitals shown include unvalidated device data.  Last Pain:  Vitals:   02/24/19 1012  TempSrc:   PainSc: 0-No pain      Patients Stated Pain Goal: 3 (87/86/76 7209)  Complications: No apparent anesthesia complications

## 2019-02-25 MED ORDER — DIPHENHYDRAMINE HCL 25 MG PO CAPS
50.0000 mg | ORAL_CAPSULE | Freq: Four times a day (QID) | ORAL | Status: DC | PRN
  Administered 2019-02-25 – 2019-02-27 (×2): 50 mg via ORAL
  Filled 2019-02-25 (×2): qty 2

## 2019-02-25 MED ORDER — SODIUM CHLORIDE 0.9 % IV SOLN: 500.0000 mL | Freq: Once | INTRAVENOUS | Status: AC

## 2019-02-25 NOTE — Progress Notes (Signed)
Pt educated on IS and the importance of getting out of bed and moving around; pt OOB with RN assistance; ambulated 155ft from bed to hallway and back to room; pt voices relief from headache after walking; back incision dsg remains unremarkable; pt sitting up in chair in room; call light within reach. Will continue to closely monitor. Delia Heady RN

## 2019-02-25 NOTE — Progress Notes (Signed)
BP 86/62 manually, Dr Annette Stable called, called back and told as long as she is asymptomatic to just hold benzos and pain medicines unless BP goes above 100

## 2019-02-25 NOTE — Progress Notes (Signed)
Postop day 1.  Pain reasonably well controlled.  No lower extremity pain.  Left leg buckled somewhat when she got up to walk this morning.  She is afebrile.  Her vital signs are stable.  She is awake and alert.  Speech is fluent.  Motor and sensory function are intact to direct testing.  Wound clean and dry.  Chest and abdomen benign.  Overall doing well.  Work at mobilizing today.  Probable discharge tomorrow morning.

## 2019-02-25 NOTE — Anesthesia Postprocedure Evaluation (Signed)
Anesthesia Post Note  Patient: Carol Love  Procedure(s) Performed: posterior lumber interbody fusion - Lumbar two-Lumbar three - Lumbar three-Lumbar four (N/A Back)     Patient location during evaluation: PACU Anesthesia Type: General Level of consciousness: awake and alert Pain management: pain level controlled Vital Signs Assessment: post-procedure vital signs reviewed and stable Respiratory status: spontaneous breathing, nonlabored ventilation, respiratory function stable and patient connected to nasal cannula oxygen Cardiovascular status: blood pressure returned to baseline and stable Postop Assessment: no apparent nausea or vomiting Anesthetic complications: no    Last Vitals:  Vitals:   02/25/19 1144 02/25/19 1200  BP: (!) 85/58 (!) 90/58  Pulse: 75   Resp: 13   Temp: 37.7 C   SpO2: 93% 95%    Last Pain:  Vitals:   02/25/19 1144  TempSrc: Oral  PainSc:                  Joury Allcorn S

## 2019-02-25 NOTE — Progress Notes (Signed)
PT Cancellation Note  Patient Details Name: Carol Love MRN: [id number] DOB: [birth year]   Cancelled Treatment:      Nursing tech assessed blood pressure prior to therapy seeing patient. Patient's B/P measured at 78/58. Patient was symptomatic lying flat reporting mild syncope. Eval held at this time. Therapy will follow up.     Carney Living PT DPT  02/25/2019, 10:13 AM

## 2019-02-25 NOTE — Progress Notes (Signed)
OT Cancellation Note  Patient Details Name: Carol Love MRN: [id number] DOB: 25-Mar-1951   Cancelled Treatment:    Reason Eval/Treat Not Completed: Other (comment); spoke with PT and pt with low BP this AM, will hold OT eval and follow up as able.  Lou Cal, OT [facility] Pager [phone] Office [phone]   Raymondo Band 02/25/2019, 9:54 AM

## 2019-02-25 NOTE — Progress Notes (Signed)
PT Cancellation Note  Patient Details Name: Carol Love MRN: [id number] DOB: 11/13/1950   Cancelled Treatment:     Attempted to follow up on patient in the afternoon. The patient's B/P remains low per nursing. She reports she is still feeling symptomatic. Therapy will hold today. She will be seen in the morning on 02/26/2019.    Carney Living PT DPT  02/25/2019, 3:39 PM

## 2019-02-25 NOTE — Progress Notes (Signed)
Patient bladder scanned and was over 300, Patient placed on bedside commode and told tech she was dizzy, patient appeared to faint and became very pale, bolus started and color started coming back and patient placed in bed. New orders given

## 2019-02-26 NOTE — Evaluation (Signed)
Physical Therapy Evaluation Patient Details Name: Carol Love MRN: [id number] DOB: 1951-04-01 Today's Date: 02/26/2019   History of Present Illness  Patient is a 68 year old female S/P L2--L3 L3 L4 lumbar fusion on 02/24/2019. She had low blood pressure yesterday but it is improved today. She reports left leg weakness which is new. PMH: SVD, psoriasis, MI, HTN, hyperlipiswmia, irregualr heart beat, headache, depression, CAD, constipation, arthritis, anxiety   Clinical Impression  Patient tolerated treatment well despite low baseline B/P. She ambulated 325' and had ambulated 100' with nursing last night when her B/P was higher. She required cuing and min guard for bed mobility. Her pain is controlled and improved with brace. She will likely be able to go home with her husbands assist. If her left leg weakness persists she may benefit from home health or outpatient therapy.     Follow Up Recommendations Home health PT;Outpatient PT    Equipment Recommendations       Recommendations for Other Services       Precautions / Restrictions Precautions Precautions: Back Precaution Booklet Issued: Yes (comment) Required Braces or Orthoses: Spinal Brace Spinal Brace: Lumbar corset;Applied in sitting position Restrictions Weight Bearing Restrictions: No      Mobility  Bed Mobility Overal bed mobility: Needs Assistance Bed Mobility: Sidelying to Sit   Sidelying to sit: Supervision       General bed mobility comments: cueing for log rolling technique  Transfers Overall transfer level: Needs assistance Equipment used: Rolling walker (2 wheeled) Transfers: Sit to/from Stand Sit to Stand: Min guard         General transfer comment: cueing for technique/ UE placement. Patient stood for 5 minutes. Because of baseline low B/P patient not walked long distances   Ambulation/Gait Ambulation/Gait assistance: Min guard Gait Distance (Feet): 20 Feet Assistive device: Rolling walker (2  wheeled) Gait Pattern/deviations: Step-through pattern Gait velocity: decreased   General Gait Details: slow strep through ghait pattern. No significant wekaness noted on the left side depsite reprorts of decreased sensation. Patient not walked as far as the night before 2nd to low B/P at baseline  Stairs            Wheelchair Mobility    Modified Rankin (Stroke Patients Only)       Balance Overall balance assessment: Needs assistance Sitting-balance support: Feet supported;No upper extremity supported Sitting balance-Leahy Scale: Fair     Standing balance support: During functional activity;Bilateral upper extremity supported Standing balance-Leahy Scale: Fair                               Pertinent Vitals/Pain Pain Assessment: 0-10 Pain Score: 4  Pain Location: Incision, lumbar Pain Descriptors / Indicators: Sore Pain Intervention(s): Limited activity within patient's tolerance;Monitored during session;Patient requesting pain meds-RN notified    Home Living Family/patient expects to be discharged to:: Private residence Living Arrangements: Spouse/significant other Available Help at Discharge: Available 24 hours/day Type of Home: House Home Access: Stairs to enter Entrance Stairs-Rails: None Entrance Stairs-Number of Steps: 2 Home Layout: One level Home Equipment: Shower seat - built in      Prior Function Level of Independence: Independent with assistive device(s)         Comments: Husband assisted with LB dressing last spinal sx, occasionally used a RW     Hand Dominance   Dominant Hand: Right    Extremity/Trunk Assessment   Upper Extremity Assessment Upper Extremity Assessment: Defer to  OT evaluation    Lower Extremity Assessment Lower Extremity Assessment: RLE deficits/detail RLE Deficits / Details: generalized left LE weakness    Cervical / Trunk Assessment Cervical / Trunk Assessment: Other exceptions  Communication    Communication: No difficulties  Cognition Arousal/Alertness: Awake/alert Behavior During Therapy: WFL for tasks assessed/performed Overall Cognitive Status: Within Functional Limits for tasks assessed                                        General Comments General comments (skin integrity, edema, etc.): B/P montiored throughout treatment. Depite low baseline numbers patient asymptomatic     Exercises     Assessment/Plan    PT Assessment Patient needs continued PT services  PT Problem List Decreased strength;Decreased activity tolerance;Decreased balance;Decreased range of motion;Decreased knowledge of use of DME;Decreased knowledge of precautions;Pain       PT Treatment Interventions DME instruction;Gait training;Stair training;Therapeutic activities;Functional mobility training;Therapeutic exercise;Neuromuscular re-education;Balance training;Patient/family education    PT Goals (Current goals can be found in the Care Plan section)  Acute Rehab PT Goals Patient Stated Goal: "get back on my feet" PT Goal Formulation: With patient Time For Goal Achievement: 03/05/19 Potential to Achieve Goals: Good    Frequency Min 5X/week   Barriers to discharge        Co-evaluation PT/OT/SLP Co-Evaluation/Treatment: Yes Reason for Co-Treatment: Complexity of the patient's impairments (multi-system involvement);For patient/therapist safety;Necessary to address cognition/behavior during functional activity;To address functional/ADL transfers(low baseline Sao2 ) PT goals addressed during session: Mobility/safety with mobility;Proper use of DME;Balance;Strengthening/ROM OT goals addressed during session: ADL's and self-care;Proper use of Adaptive equipment and DME       AM-PAC PT "6 Clicks" Mobility  Outcome Measure Help needed turning from your back to your side while in a flat bed without using bedrails?: A Little Help needed moving from lying on your back to sitting on the  side of a flat bed without using bedrails?: A Little Help needed moving to and from a bed to a chair (including a wheelchair)?: None Help needed standing up from a chair using your arms (e.g., wheelchair or bedside chair)?: None Help needed to walk in hospital room?: A Little Help needed climbing 3-5 steps with a railing? : A Little 6 Click Score: 20    End of Session Equipment Utilized During Treatment: Gait belt Activity Tolerance: Patient tolerated treatment well Patient left: in chair;with call bell/phone within reach Nurse Communication: Mobility status PT Visit Diagnosis: Other abnormalities of gait and mobility (R26.89);Muscle weakness (generalized) (M62.81);Pain Pain - Right/Left: (lower back )    Time:  -      Charges:   PT Evaluation $PT Eval Moderate Complexity: 1 Mod            Carney Living PT DPT  02/26/2019, 10:04 AM

## 2019-02-26 NOTE — Progress Notes (Signed)
Pt OOB to BSC to void x1 but unable to void; bladder scanned for 284ml. Pt said she felt the urge to void when bladder was pressed from the bladder scanner. Pt wants to wait to see if she will be able to void on her own. Will continue to closely monitor. Delia Heady RN

## 2019-02-26 NOTE — Progress Notes (Signed)
Pt OOB to BSC to void but unable to urinate; pt bladder scanned for 782ml; pt I&O cathed for 1015ml. Urine amber and clear. Will continue to closely monitor. Delia Heady RN

## 2019-02-26 NOTE — Progress Notes (Signed)
Postop day 2.  Patient with some transient hypotension yesterday which is now resolved.  She is awake and alert.  Her heart rate is normal.  Her O2 sats are good.  She still having some difficulty with urinary retention.  Afebrile.  Heart rate and blood pressure acceptable.  Motor and sensory function intact.  Wound clean and dry.  Slowly recovering from 2 level lumbar decompression and fusion.  Continue efforts at mobilization.  Likely home tomorrow.

## 2019-02-26 NOTE — Evaluation (Signed)
Occupational Therapy Evaluation Patient Details Name: Carol Love MRN: [id number] DOB: 1951-03-07 Today's Date: 02/26/2019    History of Present Illness Patient is a 68 year old female S/P L2--L3 L3 L4 lumbar fusion on 02/24/2019. She had low blood pressure yesterday but it is improved today. She reports left leg weakness which is new. PMH: SVD, psoriasis, MI, HTN, hyperlipiswmia, irregualr heart beat, headache, depression, CAD, constipation, arthritis, anxiety    Clinical Impression   Carol Love presents to OT s/p lumbar fusion sx. She requires min A for LB ADLs but prefers spouse to assist and politely declines use of AE. Pt has been limited by hypotension prior session. See below for BP during session. +2 present for safety d/t BP concerns, but pt performing functional mobility at (S) to min guard level. OT will continue to follow acutely but anticipate no need for OT follow up.   Supine: 97/68 EOB: 97/54 Standing: 80/54 End of Session: 90/60    Follow Up Recommendations  No OT follow up    Equipment Recommendations  None recommended by OT       Precautions / Restrictions Precautions Precautions: Back Precaution Booklet Issued: Yes (comment) Required Braces or Orthoses: Spinal Brace Spinal Brace: Lumbar corset;Applied in sitting position Restrictions Weight Bearing Restrictions: No      Mobility Bed Mobility Overal bed mobility: Needs Assistance Bed Mobility: Sidelying to Sit   Sidelying to sit: Supervision       General bed mobility comments: cueing for log rolling technique  Transfers Overall transfer level: Needs assistance Equipment used: Rolling walker (2 wheeled) Transfers: Sit to/from Stand Sit to Stand: Min guard         General transfer comment: cueing for technique/ UE placement    Balance Overall balance assessment: Needs assistance Sitting-balance support: Feet supported;No upper extremity supported Sitting balance-Leahy Scale: Fair      Standing balance support: During functional activity;Bilateral upper extremity supported Standing balance-Leahy Scale: Fair                             ADL either performed or assessed with clinical judgement   ADL Overall ADL's : Needs assistance/impaired Eating/Feeding: Independent   Grooming: Supervision/safety;Wash/dry hands;Wash/dry face;Oral care;Standing;Cueing for compensatory techniques Grooming Details (indicate cue type and reason): cueing for adherence to back precautions Upper Body Bathing: Modified independent;Sitting   Lower Body Bathing: Minimal assistance;Sit to/from stand;Cueing for back precautions   Upper Body Dressing : Modified independent;Sitting   Lower Body Dressing: Minimal assistance;Cueing for compensatory techniques;Cueing for back precautions;Sit to/from stand Lower Body Dressing Details (indicate cue type and reason): min A to thread BLE through Toilet Transfer: Supervision/safety;Ambulation   Toileting- Clothing Manipulation and Hygiene: Min guard;Sit to/from stand;Cueing for back precautions       Functional mobility during ADLs: Supervision/safety General ADL Comments: Pt politely declines AE for LB dressing, states husband will help      Vision Baseline Vision/History: Wears glasses Wears Glasses: At all times Patient Visual Report: No change from baseline Vision Assessment?: No apparent visual deficits            Pertinent Vitals/Pain Pain Assessment: 0-10 Pain Score: 4  Pain Location: Incision, lumbar Pain Descriptors / Indicators: Sore Pain Intervention(s): Limited activity within patient's tolerance;Monitored during session;Patient requesting pain meds-RN notified     Hand Dominance Right   Extremity/Trunk Assessment Upper Extremity Assessment Upper Extremity Assessment: Defer to OT evaluation   Lower Extremity Assessment Lower Extremity Assessment:  RLE deficits/detail RLE Deficits / Details: generalized left LE  weakness   Cervical / Trunk Assessment Cervical / Trunk Assessment: Other exceptions   Communication Communication Communication: No difficulties   Cognition Arousal/Alertness: Awake/alert Behavior During Therapy: WFL for tasks assessed/performed Overall Cognitive Status: Within Functional Limits for tasks assessed                                     General Comments  Skilled monitoring of hemodynamic stability throughout session- pt asymptomatic       Home Living Family/patient expects to be discharged to:: Private residence Living Arrangements: Spouse/significant other Available Help at Discharge: Available 24 hours/day Type of Home: House Home Access: Stairs to enter [employer] of Steps: 2 Entrance Stairs-Rails: None Home Layout: One level     Bathroom Shower/Tub: Occupational psychologist: Handicapped height     Home Equipment: Civil engineer, contracting - built in          Prior Functioning/Environment Level of Independence: Independent with assistive device(s)        Comments: Husband assisted with LB dressing last spinal sx, occasionally used a RW        OT Problem List: Decreased knowledge of use of DME or AE;Decreased knowledge of precautions;Pain;Impaired balance (sitting and/or standing);Decreased activity tolerance      OT Treatment/Interventions: Self-care/ADL training;Therapeutic exercise;Balance training;Patient/family education;Therapeutic activities;DME and/or AE instruction;Energy conservation    OT Goals(Current goals can be found in the care plan section) Acute Rehab OT Goals Patient Stated Goal: "get back on my feet" OT Goal Formulation: With patient Time For Goal Achievement: 03/12/19 Potential to Achieve Goals: Good  OT Frequency: Min 3X/week        Co-evaluation PT/OT/SLP Co-Evaluation/Treatment: Yes Reason for Co-Treatment: Complexity of the patient's impairments (multi-system involvement);For  patient/therapist safety;Necessary to address cognition/behavior during functional activity;To address functional/ADL transfers(low baseline Sao2 ) PT goals addressed during session: Mobility/safety with mobility;Proper use of DME;Balance;Strengthening/ROM OT goals addressed during session: ADL's and self-care;Proper use of Adaptive equipment and DME      AM-PAC OT "6 Clicks" Daily Activity     Outcome Measure Help from another person eating meals?: None Help from another person taking care of personal grooming?: None Help from another person toileting, which includes using toliet, bedpan, or urinal?: A Little Help from another person bathing (including washing, rinsing, drying)?: A Little Help from another person to put on and taking off regular upper body clothing?: None Help from another person to put on and taking off regular lower body clothing?: A Little 6 Click Score: 21   End of Session Equipment Utilized During Treatment: Gait belt;Rolling walker;Back brace Nurse Communication: Mobility status  Activity Tolerance: Patient tolerated treatment well Patient left: in chair;with call bell/phone within reach  OT Visit Diagnosis: Unsteadiness on feet (R26.81);Muscle weakness (generalized) (M62.81)                Time: 8657-8469 OT Time Calculation (min): 32 min Charges:  OT General Charges $OT Visit: 1 Visit OT Evaluation $OT Eval Moderate Complexity: 1 Mod Curtis Sites OTR/L  02/26/2019, 9:57 AM

## 2019-02-27 LAB — URINALYSIS, ROUTINE W REFLEX MICROSCOPIC
Bacteria, UA: NONE SEEN
Bilirubin Urine: NEGATIVE
Glucose, UA: NEGATIVE mg/dL
Ketones, ur: 20 mg/dL — AB
Leukocytes,Ua: NEGATIVE
Nitrite: NEGATIVE
Protein, ur: NEGATIVE mg/dL
Specific Gravity, Urine: 1.011 (ref 1.005–1.030)
pH: 6 (ref 5.0–8.0)

## 2019-02-27 MED ORDER — BISACODYL 5 MG PO TBEC
5.0000 mg | DELAYED_RELEASE_TABLET | Freq: Every day | ORAL | Status: DC | PRN
  Administered 2019-02-27 – 2019-02-28 (×2): 10 mg via ORAL
  Filled 2019-02-27 (×2): qty 2

## 2019-02-27 MED ORDER — DEXAMETHASONE SODIUM PHOSPHATE 10 MG/ML IJ SOLN
4.0000 mg | Freq: Four times a day (QID) | INTRAMUSCULAR | Status: AC
  Administered 2019-02-27 (×3): 4 mg via INTRAVENOUS
  Filled 2019-02-27 (×3): qty 1

## 2019-02-27 NOTE — Progress Notes (Signed)
Physical Therapy Treatment Patient Details Name: Carol Love MRN: [id number] DOB: 06-09-1951 Today's Date: 02/27/2019    History of Present Illness Patient is a 68 year old female S/P L2--L3 L3 L4 lumbar fusion on 02/24/2019. She had low blood pressure yesterday but it is improved today. She reports left leg weakness which is new. PMH: SVD, psoriasis, MI, HTN, hyperlipiswmia, irregualr heart beat, headache, depression, CAD, constipation, arthritis, anxiety     PT Comments    Patient seen for mobility progression. Pt is very drowsy/lethargic this am likely from medication. Pt is agreeable to participate and tolerated gait training distance of 50 ft with min A and RW. Pt will continue to benefit from further skilled PT services to maximize independence and safety with mobility.     Follow Up Recommendations  Home health PT; Supervision/assistance 24 hours     Equipment Recommendations  None recommended by PT;Other (comment)(pt reports having RW and 3 in 1 at home)    Recommendations for Other Services       Precautions / Restrictions Precautions Precautions: Back Precaution Comments: reviewed precautions with pt; pt able to recall 2/3 precautions Required Braces or Orthoses: Spinal Brace Spinal Brace: Lumbar corset;Applied in sitting position Restrictions Weight Bearing Restrictions: No    Mobility  Bed Mobility Overal bed mobility: Needs Assistance Bed Mobility: Sidelying to Sit;Rolling Rolling: Min guard Sidelying to sit: Min assist       General bed mobility comments: cues for sequencing; assist to elevate trunk into sitting; use of rail  Transfers Overall transfer level: Needs assistance Equipment used: Rolling walker (2 wheeled) Transfers: Sit to/from Stand Sit to Stand: Min assist         General transfer comment: assist to steady; cues for safe hand placement  Ambulation/Gait Ambulation/Gait assistance: Min assist Gait Distance (Feet): 50 Feet Assistive  device: Rolling walker (2 wheeled) Gait Pattern/deviations: Decreased step length - right;Step-through pattern;Decreased step length - left;Shuffle Gait velocity: decreased   General Gait Details: slow cadence; max cues for increased bilatstep lengths and to keep eyes open; assistance to steady and guide RW especially with turns   Marine scientist Rankin (Stroke Patients Only)       Balance Overall balance assessment: Needs assistance Sitting-balance support: Feet supported;No upper extremity supported Sitting balance-Leahy Scale: Fair     Standing balance support: During functional activity;Bilateral upper extremity supported Standing balance-Leahy Scale: Poor                              Cognition Arousal/Alertness: Lethargic;Suspect due to medications Behavior During Therapy: [facility] for tasks assessed/performed Overall Cognitive Status: Impaired/Different from baseline Area of Impairment: Attention;Memory;Following commands;Problem solving                   Current Attention Level: Sustained Memory: Decreased recall of precautions Following Commands: Follows one step commands with increased time     Problem Solving: Decreased initiation;Difficulty sequencing;Requires verbal cues General Comments: pt very drowsy during session which limited mobility; likely due to pain medication      Exercises      General Comments General comments (skin integrity, edema, etc.): a lot of drainage noted on bed pad and soaked through to sheet upon pt sitting up EOB--RN notified       Pertinent Vitals/Pain Pain Assessment: Faces Faces Pain Scale: Hurts a little bit Pain  Location: L LE Pain Descriptors / Indicators: Sore Pain Intervention(s): Limited activity within patient's tolerance;Monitored during session;Premedicated before session;Repositioned    Home Living                      Prior Function             PT Goals (current goals can now be found in the care plan section) Progress towards PT goals: Progressing toward goals    Frequency    Min 5X/week      PT Plan Current plan remains appropriate    Co-evaluation              AM-PAC PT "6 Clicks" Mobility   Outcome Measure  Help needed turning from your back to your side while in a flat bed without using bedrails?: A Little Help needed moving from lying on your back to sitting on the side of a flat bed without using bedrails?: A Little Help needed moving to and from a bed to a chair (including a wheelchair)?: A Little Help needed standing up from a chair using your arms (e.g., wheelchair or bedside chair)?: A Little Help needed to walk in hospital room?: A Little Help needed climbing 3-5 steps with a railing? : A Lot 6 Click Score: 17    End of Session Equipment Utilized During Treatment: Gait belt Activity Tolerance: Patient limited by fatigue;Other (comment)(pt very drowsy) Patient left: with call bell/phone within reach;in bed;with bed alarm set Nurse Communication: Mobility status PT Visit Diagnosis: Other abnormalities of gait and mobility (R26.89);Muscle weakness (generalized) (M62.81);Pain Pain - Right/Left: (lower back )     Time: 8110-3159 PT Time Calculation (min) (ACUTE ONLY): 38 min  Charges:  $Gait Training: 8-22 mins $Therapeutic Activity: 8-22 mins                     Earney Navy, PTA Acute Rehabilitation Services Pager: [phone] Office: [phone]     Darliss Cheney 02/27/2019, 1:31 PM

## 2019-02-27 NOTE — Plan of Care (Signed)
Progressing towards discharge 

## 2019-02-27 NOTE — Progress Notes (Signed)
Subjective: The patient is alert and pleasant.  She complains of back pain with bilateral posterior leg pain.  She has not yet not urinated yet this morning.  Objective: Vital signs in last 24 hours: Temp:  [97.9 F (36.6 C)-98.7 F (37.1 C)] 98 F (36.7 C) (06/06 0818) Pulse Rate:  [71-81] 72 (06/06 0818) Resp:  [13-20] 16 (06/06 0818) BP: (99-160)/(64-90) 103/64 (06/06 0818) SpO2:  [95 %-100 %] 99 % (06/06 0818) Estimated body mass index is 27.64 kg/m as calculated from the following:   Height as of this encounter: 4' 11.25" (1.505 m).   Weight as of this encounter: 62.6 kg.   Intake/Output from previous day: 06/05 0701 - 06/06 0700 In: -  Out: 1600 [Urine:1600] Intake/Output this shift: No intake/output data recorded.  Physical exam the patient strength is grossly normal in her lower extremities.  She is in no apparent distress.  Lab Results: No results for input(s): WBC, HGB, HCT, PLT in the last 72 hours. BMET No results for input(s): NA, K, CL, CO2, GLUCOSE, BUN, CREATININE, CALCIUM in the last 72 hours.  Studies/Results: No results found.  Assessment/Plan: Postop day #3: I will add a few doses of Decadron for presumed inflammation causing her radicular symptoms.  Urinary retention: I will check a urinalysis and urine culture.  Perhaps she can go home tomorrow.  LOS: 3 days     Ophelia Charter 02/27/2019, 9:36 AM

## 2019-02-27 NOTE — Progress Notes (Signed)
Pt bladder scanned midnight for 784ml; RN assisted pt from bed to [facility] in attempt to void but unable to urinate. Pt assisted back to bed; per previous order for foley and protocol; foley was inserted with NT assistance. Foley insertion protocol followed; charge RN aware and notified with AD prior insertion but no response; pt urine was clear, amber with no odor. 821ml emptied from bag after insertion. Will continue to closely monitor pt. Delia Heady RN

## 2019-02-28 LAB — URINE CULTURE: Culture: NO GROWTH

## 2019-02-28 NOTE — Progress Notes (Signed)
Pt BP in the 80's but pt asymptomatic; OOB and ambulated to BR to use; then stood by sink in room to wash up and brush teeth. Pt sat up in chair for about an hour and back to bed. BP still 89/58. MD on-call Dr. Arnoldo Morale notified and no new orders received; MD said it was OK to give pt her scheduled medications and prn if needed since her HR is normal rate and not tachycardic. Pt remain comfortably in bed with call light within reach. Will continue to closely monitor. Delia Heady RN   02/28/19 2147  Vitals  BP (!) 89/58  MAP (mmHg) 69  BP Location Left Arm  BP Method Automatic  Patient Position (if appropriate) Lying  Pulse Rate 69  Pulse Rate Source Monitor  Oxygen Therapy  SpO2 94 %  MEWS Score  MEWS RR 0  MEWS Pulse 0  MEWS Systolic 1  MEWS LOC 0  MEWS Temp 0  MEWS Score 1  MEWS Score Color Nyoka Cowden

## 2019-02-28 NOTE — Progress Notes (Signed)
Physical Therapy Treatment Patient Details Name: Carol Love MRN: [id number] DOB: November 25, 1950 Today's Date: 02/28/2019    History of Present Illness Patient is a 68 year old female S/P L2--L3 L3 L4 lumbar fusion on 02/24/2019. She had low blood pressure yesterday but it is improved today. She reports left leg weakness which is new. PMH: SVD, psoriasis, MI, HTN, hyperlipiswmia, irregualr heart beat, headache, depression, CAD, constipation, arthritis, anxiety     PT Comments    Pt continues to experience severe cramping in LLE that is exacerbated by prolonged positioning and limited mobility and contributes to concern for safety and independence returning home.  Able to eventually walk laps in room and felt more hopeful about home d/c by end of session.  Pt will benefit greatly from: 1) walking with supervision around room every 1-2 hours to encourage activity;  2) assist especially overnight rolling to side and secure position support with pillow between knees and pillow behind back.    Expect once muscle spasm/cramping is under control pt will be ready for d/c to home with [facility] follow up.    Follow Up Recommendations  Home health PT     Equipment Recommendations  None recommended by PT    Recommendations for Other Services       Precautions / Restrictions Precautions Precautions: Back Precaution Comments: pt able to maintain back precautions uncued Required Braces or Orthoses: Spinal Brace Spinal Brace: Lumbar corset;Applied in sitting position Restrictions Weight Bearing Restrictions: No    Mobility  Bed Mobility Overal bed mobility: Needs Assistance Bed Mobility: Rolling;Sidelying to Sit Rolling: Supervision Sidelying to sit: Supervision       General bed mobility comments: needs cues for logroll technique, where pt tries to sit up in bed rather than roll; instruction to assist LLE as needed into flexed knee and use momentum of legs down to pendulum torso up from side;  performs well with min sign of pain  Transfers Overall transfer level: Needs assistance Equipment used: Rolling walker (2 wheeled) Transfers: Sit to/from Stand Sit to Stand: Min assist         General transfer comment: initally provided 5-10% assist mostly for safety and comfort; initial stand to RW pt experienced severe cramping in LLE requiring seated rest and comforting, but with repeated trials pt eventually able to push to stand without device and remain steady  Ambulation/Gait Ambulation/Gait assistance: Min assist Gait Distance (Feet): 20 Feet(two laps around room with seated rest between) Assistive device: Rolling walker (2 wheeled) Gait Pattern/deviations: Step-through pattern;Decreased stride length;Antalgic Gait velocity: decreased   General Gait Details: very slow but steady and once fear of return of cramp in LLE receded, pt able to assume more fluid gait pattern and progress to supervision only using RW   Stairs Stairs: (discussed, pt feels confident with method )           Wheelchair Mobility    Modified Rankin (Stroke Patients Only)       Balance Overall balance assessment: Needs assistance Sitting-balance support: Feet supported;No upper extremity supported Sitting balance-Leahy Scale: Good Sitting balance - Comments: teeters briefly at lip of bed due to uneven surface but able to scoot confidently forward and backward unassisted   Standing balance support: During functional activity;Bilateral upper extremity supported;No upper extremity supported Standing balance-Leahy Scale: Fair Standing balance comment: with RW, and without pain in LLE, pt able to maintain steady stance and gait; pt did stand briefly unsupported at end of session without pain or instability  Cognition Arousal/Alertness: Awake/alert Behavior During Therapy: WFL for tasks assessed/performed Overall Cognitive Status: Within Functional Limits  for tasks assessed                                 General Comments: pt alert and conversational, preoccupied by fear of pain and by actual cramping pain in LLE, but with time and encouragemetn, pt able to focus on session;       Exercises   Instructed pt on gentle ROM while in bed to promote muscle relaxation in back and hips, and isotonic exercises for LE circulation and muscular stimulation    General Comments General comments (skin integrity, edema, etc.): pt encouraged to walk multiple short bouts (round room once every hour as able) with nursing assistance      Pertinent Vitals/Pain Pain Assessment: 0-10 Pain Score: 7  Pain Location: L LE Pain Descriptors / Indicators: Aching;Cramping;Grimacing;Crying;Spasm Pain Intervention(s): Monitored during session;Limited activity within patient's tolerance;Repositioned    Home Living                      Prior Function            PT Goals (current goals can now be found in the care plan section) Acute Rehab PT Goals Patient Stated Goal: get around home without any help or fear or pain PT Goal Formulation: With patient Time For Goal Achievement: 03/05/19 Potential to Achieve Goals: Good Progress towards PT goals: Progressing toward goals    Frequency    Min 5X/week      PT Plan Current plan remains appropriate    Co-evaluation              AM-PAC PT "6 Clicks" Mobility   Outcome Measure  Help needed turning from your back to your side while in a flat bed without using bedrails?: A Little Help needed moving from lying on your back to sitting on the side of a flat bed without using bedrails?: None Help needed moving to and from a bed to a chair (including a wheelchair)?: A Little Help needed standing up from a chair using your arms (e.g., wheelchair or bedside chair)?: None Help needed to walk in hospital room?: A Little Help needed climbing 3-5 steps with a railing? : A Lot 6 Click Score:  19    End of Session Equipment Utilized During Treatment: Gait belt Activity Tolerance: Patient limited by pain;Patient tolerated treatment well(pain limited initially, but resolved with time) Patient left: in chair;with call bell/phone within reach Nurse Communication: Mobility status PT Visit Diagnosis: Other abnormalities of gait and mobility (R26.89);Muscle weakness (generalized) (M62.81);Pain Pain - Right/Left: Left Pain - part of body: Leg     Time: 8119-1478 PT Time Calculation (min) (ACUTE ONLY): 48 min  Charges:  $Gait Training: 23-37 mins $Therapeutic Activity: 23-37 mins                     Kearney Hard, PT, DPT, MS Board Certified Geriatric Clinical Specialist   Herbie Drape 02/28/2019, 10:34 AM

## 2019-02-28 NOTE — Progress Notes (Signed)
Patient ID: Carol Love, female   DOB: 02/20/1951, 68 y.o.   MRN: [id number] Subjective: Patient reports continued back pain with posterior leg pain without numbness tingling or weakness.  Nursing states that she actually got up and walked a few feet.  Pain is aching and severe.  Objective: Vital signs in last 24 hours: Temp:  [98.2 F (36.8 C)-98.9 F (37.2 C)] 98.4 F (36.9 C) (06/07 0412) Pulse Rate:  [73-77] 73 (06/07 0412) Resp:  [12-16] 14 (06/07 0412) BP: (95-115)/(51-76) 115/76 (06/07 0412) SpO2:  [92 %-98 %] 92 % (06/07 0412)  Intake/Output from previous day: 06/06 0701 - 06/07 0700 In: -  Out: 1275 [Urine:1275] Intake/Output this shift: No intake/output data recorded.  Neurologic: Grossly normal in bed exam.  Lab Results: Lab Results  Component Value Date   WBC 5.3 02/17/2019   HGB 13.7 02/17/2019   HCT 39.7 02/17/2019   MCV 94.3 02/17/2019   PLT 216 02/17/2019   No results found for: INR, PROTIME BMET Lab Results  Component Value Date   NA 141 02/17/2019   K 4.4 02/17/2019   CL 103 02/17/2019   CO2 27 02/17/2019   GLUCOSE 82 02/17/2019   BUN 11 02/17/2019   CREATININE 0.93 02/17/2019   CALCIUM 9.9 02/17/2019    Studies/Results: No results found.  Assessment/Plan: Continued pain after lumbar interbody fusion.  Continue pain management.  Mentions that an MRI of the lumbar spine has been ordered but I see no order or a mention of it in the progress notes.  Estimated body mass index is 27.64 kg/m as calculated from the following:   Height as of this encounter: 4' 11.25" (1.505 m).   Weight as of this encounter: 62.6 kg.    LOS: 4 days    Eustace Moore 02/28/2019, 8:41 AM

## 2019-02-28 NOTE — Progress Notes (Signed)
Pt out of bed with RN assistance; ambulated 14ft from room to around the circle back to room using brace and walker. Pt independently stood in front of sink to wash up and brush teeth. Pt sitting up in chair with call light within reach. Will continue to closely monitor. Delia Heady RN

## 2019-03-01 ENCOUNTER — Inpatient Hospital Stay (HOSPITAL_COMMUNITY): Payer: Medicare Other

## 2019-03-01 MED FILL — Sodium Chloride IV Soln 0.9%: INTRAVENOUS | Qty: 1000 | Status: AC

## 2019-03-01 MED FILL — Heparin Sodium (Porcine) Inj 1000 Unit/ML: INTRAMUSCULAR | Qty: 30 | Status: AC

## 2019-03-01 NOTE — Progress Notes (Signed)
Physical Therapy Treatment Patient Details Name: Carol Love MRN: [id number] DOB: May 14, 1951 Today's Date: 03/01/2019    History of Present Illness Patient is a 68 year old female S/P L2--L3 L3 L4 lumbar fusion on 02/24/2019. She had low blood pressure yesterday but it is improved today. She reports left leg weakness which is new. PMH: SVD, psoriasis, MI, HTN, hyperlipiswmia, irregualr heart beat, headache, depression, CAD, constipation, arthritis, anxiety     PT Comments    Despite earlier fall the patient was able to increase ambulation distance. She had no loss of balance or signs of left LE buckling. She ambulated with a slow but steady gait pattern. She reported some fatigue but no increase in pain in her leg. She had some baseline pain in her leg.   Follow Up Recommendations  Home health PT     Equipment Recommendations  None recommended by PT    Recommendations for Other Services       Precautions / Restrictions Precautions Precautions: Back Precaution Booklet Issued: Yes (comment) Precaution Comments: pt able to maintain back precautions uncued Required Braces or Orthoses: Spinal Brace Spinal Brace: Lumbar corset;Applied in sitting position Restrictions Weight Bearing Restrictions: No    Mobility  Bed Mobility Overal bed mobility: Needs Assistance Bed Mobility: Rolling;Sidelying to Sit Rolling: Supervision Sidelying to sit: Supervision       General bed mobility comments: needs cues for logroll technique, where pt tries to sit up in bed rather than roll; instruction to assist LLE as needed into flexed knee and use momentum of legs down to pendulum torso up from side; performs well with min sign of pain  Transfers Overall transfer level: Needs assistance Equipment used: Rolling walker (2 wheeled) Transfers: Sit to/from Stand Sit to Stand: Min assist         General transfer comment: initally provided 5-10% assist mostly for safety and comfort; initial  stand to RW pt experienced severe cramping in LLE requiring seated rest and comforting, but with repeated trials pt eventually able to push to stand without device and remain steady  Ambulation/Gait Ambulation/Gait assistance: Min assist Gait Distance (Feet): 150 Feet(20) Assistive device: Rolling walker (2 wheeled) Gait Pattern/deviations: Step-through pattern;Decreased stride length;Antalgic     General Gait Details: slow but steady gait pattern. Chair follow perfromed but patient did not need to sit.    Stairs             Wheelchair Mobility    Modified Rankin (Stroke Patients Only)       Balance Overall balance assessment: Needs assistance Sitting-balance support: Feet supported;No upper extremity supported Sitting balance-Leahy Scale: Good     Standing balance support: During functional activity;Bilateral upper extremity supported;No upper extremity supported Standing balance-Leahy Scale: Fair                              Cognition Arousal/Alertness: Awake/alert Behavior During Therapy: WFL for tasks assessed/performed Overall Cognitive Status: Within Functional Limits for tasks assessed                                 General Comments: pt alert and conversational, preoccupied by fear of pain and by actual cramping pain in LLE, but with time and encouragemetn, pt able to focus on session;       Exercises      General Comments General comments (skin integrity, edema, etc.): don/dof back  brace independently      Pertinent Vitals/Pain Pain Assessment: 0-10 Pain Score: 7  Pain Location: L LE Pain Descriptors / Indicators: Aching;Cramping;Grimacing;Spasm Pain Intervention(s): Limited activity within patient's tolerance;Monitored during session;Repositioned    Home Living Family/patient expects to be discharged to:: Private residence                    Prior Function            PT Goals (current goals can now be  found in the care plan section) Acute Rehab PT Goals PT Goal Formulation: With patient Time For Goal Achievement: 03/05/19 Potential to Achieve Goals: Good Progress towards PT goals: Progressing toward goals    Frequency    Min 5X/week      PT Plan Current plan remains appropriate    Co-evaluation              AM-PAC PT "6 Clicks" Mobility   Outcome Measure  Help needed turning from your back to your side while in a flat bed without using bedrails?: A Little Help needed moving from lying on your back to sitting on the side of a flat bed without using bedrails?: None Help needed moving to and from a bed to a chair (including a wheelchair)?: A Little Help needed standing up from a chair using your arms (e.g., wheelchair or bedside chair)?: None Help needed to walk in hospital room?: A Little Help needed climbing 3-5 steps with a railing? : A Lot 6 Click Score: 19    End of Session Equipment Utilized During Treatment: Gait belt Activity Tolerance: Patient limited by pain;Patient tolerated treatment well Patient left: in chair;with call bell/phone within reach Nurse Communication: Mobility status PT Visit Diagnosis: Other abnormalities of gait and mobility (R26.89);Muscle weakness (generalized) (M62.81);Pain Pain - Right/Left: Left Pain - part of body: Leg     Time: 1437-1500 PT Time Calculation (min) (ACUTE ONLY): 23 min  Charges:  $Gait Training: 8-22 mins                        Carney Living PT DPT  03/01/2019, 3:49 PM

## 2019-03-01 NOTE — TOC Initial Note (Signed)
Transition of Care [facility]) - Initial/Assessment Note    Patient Details  Name: Carol Love MRN: [id number] Date of Birth: [birth year]  [facility]) CM/SW Contact:    Pollie Friar, RN Phone Number: 03/01/2019, 8:01 PM  Clinical Narrative:                 PT recommending HH. [facility] choice [facility]. MD please place order [facility] PT. Pt has all needed DME at home. Pt has transportation home when medically ready.  Expected Discharge Plan: [place] Barriers to Discharge: Continued Medical Work up   Patient Goals and CMS Choice   CMS Medicare.gov Compare Post Acute Care list provided to:: Patient Choice offered to / list presented to : Patient  Expected Discharge Plan and Services Expected Discharge Plan: [place]   Discharge Planning Services: CM Consult Post Acute Care Choice: [place] Agency: [facility] [place] Date [facility] Agency Contacted: 03/01/19   Representative spoke with at [place]: Dorian Pod  Prior Living Arrangements/Services   Lives with:: Spouse Patient language and need for interpreter reviewed:: Yes(no needs) Do you feel safe going back to the place where you live?: Yes      Need for Family Participation in Patient Care: Yes (Comment) Care giver support system in place?: Yes (comment) Current home services: DME(walker and 3 in 1) Criminal Activity/Legal Involvement Pertinent to Current Situation/Hospitalization: No - Comment as needed  Activities of Daily Living Home Assistive Devices/Equipment: Blood pressure cuff, Built-in shower seat, Hand-held shower hose, Eyeglasses, Walker (specify type), Raised toilet seat with rails, Grab bars in shower ADL Screening (condition at time of admission) Patient's cognitive ability adequate to safely complete daily activities?: Yes Is the patient deaf or have difficulty hearing?: No Does the patient have difficulty seeing, even when wearing  glasses/contacts?: No Does the patient have difficulty concentrating, remembering, or making decisions?: No Patient able to express need for assistance with ADLs?: Yes Does the patient have difficulty dressing or bathing?: No Independently performs ADLs?: Yes (appropriate for developmental age) Does the patient have difficulty walking or climbing stairs?: Yes Weakness of Legs: Both Weakness of Arms/Hands: None  Permission Sought/Granted                  Emotional Assessment Appearance:: Appears stated age Attitude/Demeanor/Rapport: Engaged Affect (typically observed): Accepting, Appropriate Orientation: : Oriented to Self, Oriented to Place, Oriented to  Time   Psych Involvement: No (comment)  Admission diagnosis:  Spondylolisthesis Patient Active Problem List   Diagnosis Date Noted  . Pre-operative cardiovascular examination 12/08/2018  . Abnormal nuclear stress test 12/08/2018  . Myocardial infarction ([place]) 02/08/2016  . Degenerative spondylolisthesis 03/20/2015  . Coronary artery disease involving native coronary artery of native heart without angina pectoris 03/01/2015  . Hypercholesterolemia 03/01/2015  . Spinal stenosis of lumbar region 05/10/2010  . Essential hypertension 04/26/2010   PCP:  Levin Erp, MD Pharmacy:   [facility], [address] [place] [address] Phone: [phone] Fax: [phone]  [facility] [place] [place] - [address] [place] [address] Phone: [phone] Fax: [phone]     Social Determinants of Health (SDOH) Interventions  Readmission Risk Interventions No flowsheet data found.

## 2019-03-01 NOTE — Progress Notes (Signed)
Pt in bed watching TV. O2 saturation 87%; O2@2  via [employer] applied and sat was 96%. BP 105/71.  Pt c/o pain to her back.  Dr. Annette Stable notified; he will come to room to assess patient.

## 2019-03-01 NOTE — Care Management Important Message (Signed)
Important Message  Patient Details  Name: Carol Love MRN: [id number] Date of Birth: 18-Sep-1951   Medicare Important Message Given:  Yes    Shelda Altes 03/01/2019, 1:20 PM

## 2019-03-01 NOTE — Progress Notes (Addendum)
Pt at sink washing hands after leaving bathroom.  Pt had walker with her and RN standing next to her.  Her left leg "gave way" and fell to floor in a sitting position.  RN attempted to catch patient but only able to break fall.  Assisted back to bed. Pt denies any pain.  BP 105/63; P 70.  Dr. Annette Stable paged.

## 2019-03-01 NOTE — Progress Notes (Signed)
Overall looks better.  Pain better controlled.  Mobility still limited secondary to some subjective weakness in her left proximal leg.  No radicular pain.  No new numbness.  Patient still with catheter in place.  Afebrile.  Vital signs are stable albeit with some stable mild hypertension which is asymptomatic.  Urine output good.  Awake and alert.  Oriented and appropriate.  Wound clean and dry.  Motor and sensory examination with some mild weakness of her left hip flexors.  Abdomen soft.  Chest benign.  Patient is progressing slowly following lumbar decompression and fusion surgery.  Continue efforts at pain control and mobility.

## 2019-03-01 NOTE — Progress Notes (Signed)
Occupational Therapy Treatment Patient Details Name: Carol Love MRN: [id number] DOB: Nov 06, 1950 Today's Date: 03/01/2019    History of present illness Patient is a 68 year old female S/P L2--L3 L3 L4 lumbar fusion on 02/24/2019. She had low blood pressure yesterday but it is improved today. She reports left leg weakness which is new. PMH: SVD, psoriasis, MI, HTN, hyperlipiswmia, irregualr heart beat, headache, depression, CAD, constipation, arthritis, anxiety    OT comments  Pt progressing toward stated goals, with recent fall in room with RN this date (no injury reported). Pt cont to politely decline LB AE, wanting husband to assist. Pt performed log rolling technique well with initial cues, then at the end of session without prompting. Pt completed ADL mobility at household distance at min guard level, no indications of buckling. Pt then brushed teeth at sink, cued to use cup to clean mouth to adhere to back precautions. Pt is very well versed in precautions, states she needs reminders to "break old habits". Will continue to follow acutely, pt progressing well. D/c plan remains appropriate.    Follow Up Recommendations  No OT follow up    Equipment Recommendations  None recommended by OT    Recommendations for Other Services      Precautions / Restrictions Precautions Precautions: Back Precaution Booklet Issued: Yes (comment) Precaution Comments: pt able to maintain back precautions uncued Required Braces or Orthoses: Spinal Brace Spinal Brace: Lumbar corset;Applied in sitting position Restrictions Weight Bearing Restrictions: No       Mobility Bed Mobility Overal bed mobility: Needs Assistance Bed Mobility: Rolling;Sidelying to Sit Rolling: Supervision Sidelying to sit: Supervision       General bed mobility comments: pt performs log rolling well  Transfers Overall transfer level: Needs assistance Equipment used: Rolling walker (2 wheeled) Transfers: Sit to/from  Stand Sit to Stand: Min guard         General transfer comment: min guard for safety    Balance Overall balance assessment: Needs assistance Sitting-balance support: Feet supported;No upper extremity supported Sitting balance-Leahy Scale: Good     Standing balance support: During functional activity;Bilateral upper extremity supported;No upper extremity supported Standing balance-Leahy Scale: Fair Standing balance comment: moderately reliant on external support                           ADL either performed or assessed with clinical judgement   ADL Overall ADL's : Needs assistance/impaired     Grooming: Supervision/safety;Oral care;Standing;Cueing for compensatory techniques Grooming Details (indicate cue type and reason): cueing to use cup to rinse mouth to adhere to back precautions             Lower Body Dressing: Minimal assistance;Cueing for compensatory techniques;Cueing for back precautions;Sit to/from stand Lower Body Dressing Details (indicate cue type and reason): min A to thread BLE through               General ADL Comments: pt cont to politely decline LB AE equipment to have husband assist. Pt with good adherence to precuations     Vision Baseline Vision/History: Wears glasses Wears Glasses: At all times Patient Visual Report: No change from baseline     Perception     Praxis      Cognition Arousal/Alertness: Awake/alert Behavior During Therapy: WFL for tasks assessed/performed Overall Cognitive Status: Within Functional Limits for tasks assessed  General Comments: pt alert and conversational, preoccupied by fear of pain and by actual cramping pain in LLE, but with time and encouragemetn, pt able to focus on session;         Exercises     Shoulder Instructions       General Comments don/dof back brace independently    Pertinent Vitals/ Pain       Pain Assessment: Faces Pain  Score: 7  Faces Pain Scale: Hurts even more Pain Location: L LE Pain Descriptors / Indicators: Aching;Cramping;Grimacing;Spasm Pain Intervention(s): Limited activity within patient's tolerance;Monitored during session;Repositioned  Home Living Family/patient expects to be discharged to:: Private residence                                        Prior Functioning/Environment              Frequency  Min 3X/week        Progress Toward Goals  OT Goals(current goals can now be found in the care plan section)  Progress towards OT goals: Progressing toward goals  Acute Rehab OT Goals Patient Stated Goal: to regain ind mobility OT Goal Formulation: With patient Time For Goal Achievement: 03/12/19 Potential to Achieve Goals: Good  Plan Discharge plan remains appropriate    Co-evaluation    PT/OT/SLP Co-Evaluation/Treatment: Yes Reason for Co-Treatment: Complexity of the patient's impairments (multi-system involvement);For patient/therapist safety PT goals addressed during session: Mobility/safety with mobility;Proper use of DME;Strengthening/ROM OT goals addressed during session: ADL's and self-care;Proper use of Adaptive equipment and DME      AM-PAC OT "6 Clicks" Daily Activity     Outcome Measure   Help from another person eating meals?: None Help from another person taking care of personal grooming?: None Help from another person toileting, which includes using toliet, bedpan, or urinal?: A Little Help from another person bathing (including washing, rinsing, drying)?: A Little Help from another person to put on and taking off regular upper body clothing?: None Help from another person to put on and taking off regular lower body clothing?: A Little 6 Click Score: 21    End of Session Equipment Utilized During Treatment: Gait belt;Rolling walker;Back brace  OT Visit Diagnosis: Unsteadiness on feet (R26.81);Muscle weakness (generalized) (M62.81)    Activity Tolerance Patient tolerated treatment well   Patient Left in chair;with call bell/phone within reach   Nurse Communication Mobility status        Time: 8916-9450 OT Time Calculation (min): 21 min  Charges: OT General Charges $OT Visit: 1 Visit OT Treatments $Self Care/Home Management : 8-22 mins  Zenovia Jarred, MSOT, OTR/L [place] [facility] Office: [phone]   Zenovia Jarred 03/01/2019, 4:06 PM

## 2019-03-01 NOTE — Progress Notes (Signed)
Dr. Annette Stable returned call.  No further orders obtained.

## 2019-03-02 MED ORDER — DIAZEPAM 5 MG PO TABS: 5.0000 mg | ORAL_TABLET | Freq: Four times a day (QID) | ORAL | 0 refills | Status: DC | PRN

## 2019-03-02 MED ORDER — OXYCODONE HCL 10 MG PO TABS: 10.0000 mg | ORAL_TABLET | ORAL | 0 refills | Status: DC | PRN

## 2019-03-02 MED FILL — oxyCODONE HCL 10 MG TABS: 10 | 7 days supply | Qty: 40 | Fill #0

## 2019-03-02 MED FILL — diazePAM 5 MG TABS: 5 | 8 days supply | Qty: 30 | Fill #0

## 2019-03-02 NOTE — Progress Notes (Signed)
Physical Therapy Treatment Patient Details Name: Carol Love MRN: [id number] DOB: 1951-05-22 Today's Date: 03/02/2019    History of Present Illness Patient is a 68 year old female S/P L2--L3 L3 L4 lumbar fusion on 02/24/2019. She had low blood pressure yesterday but it is improved today. She reports left leg weakness which is new. PMH: SVD, psoriasis, MI, HTN, hyperlipiswmia, irregualr heart beat, headache, depression, CAD, constipation, arthritis, anxiety     PT Comments    Patient ambulated with improved speed and endurance today. She had no signs of syncope or leg buckling. After gait her saio2 was measured at 96% on room air. The cramping and pain in her left leg has improved although it is still there. She would benefit from further skilled acute therapy to improve endurance and safety with gait.    Follow Up Recommendations  Home health PT     Equipment Recommendations  None recommended by PT    Recommendations for Other Services       Precautions / Restrictions Precautions Precautions: Back Precaution Booklet Issued: Yes (comment) Precaution Comments: pt able to maintain back precautions uncued Required Braces or Orthoses: Spinal Brace Restrictions Weight Bearing Restrictions: No    Mobility  Bed Mobility Overal bed mobility: Needs Assistance Bed Mobility: Rolling;Sidelying to Sit Rolling: Independent Sidelying to sit: Supervision;Independent       General bed mobility comments: sat up and got back into bed indepdnently without cuing for proper log roll.   Transfers Overall transfer level: Needs assistance Equipment used: Rolling walker (2 wheeled) Transfers: Sit to/from Stand Sit to Stand: Supervision         General transfer comment: less gaurd required. Patient reported better contriol of her left leg and had no syncope. Prior to transfer Sao2 between 88-90 on room air   Ambulation/Gait Ambulation/Gait assistance: Min guard Gait Distance (Feet): 150  Feet Assistive device: Rolling walker (2 wheeled) Gait Pattern/deviations: Step-through pattern Gait velocity: decreased   General Gait Details: lwess fatigue noted today with gait. Her gait speed remained more consitent. She reported decreased pain in her hip and leg. No syncope or buckling noted.    Stairs             Wheelchair Mobility    Modified Rankin (Stroke Patients Only)       Balance Overall balance assessment: Needs assistance Sitting-balance support: Feet supported;No upper extremity supported Sitting balance-Leahy Scale: Good     Standing balance support: During functional activity;Bilateral upper extremity supported;No upper extremity supported Standing balance-Leahy Scale: Fair Standing balance comment: moderately reliant on external support                            Cognition Arousal/Alertness: Awake/alert Behavior During Therapy: WFL for tasks assessed/performed Overall Cognitive Status: Within Functional Limits for tasks assessed Area of Impairment: Attention;Memory;Following commands;Problem solving                   Current Attention Level: Sustained Memory: Decreased recall of precautions Following Commands: Follows one step commands with increased time     Problem Solving: Decreased initiation;Difficulty sequencing;Requires verbal cues General Comments: pt alert and conversational, preoccupied by fear of pain and by actual cramping pain in LLE, but with time and encouragemetn, pt able to focus on session;       Exercises      General Comments        Pertinent Vitals/Pain Pain Assessment: Faces Faces Pain Scale: Hurts  even more Pain Location: L LE Pain Descriptors / Indicators: Aching;Cramping;Grimacing;Spasm Pain Intervention(s): Limited activity within patient's tolerance    Home Living                      Prior Function            PT Goals (current goals can now be found in the care plan  section) Acute Rehab PT Goals Patient Stated Goal: to regain ind mobility PT Goal Formulation: With patient Time For Goal Achievement: 03/05/19 Potential to Achieve Goals: Good Progress towards PT goals: Progressing toward goals    Frequency    Min 5X/week      PT Plan Current plan remains appropriate    Co-evaluation              AM-PAC PT "6 Clicks" Mobility   Outcome Measure  Help needed turning from your back to your side while in a flat bed without using bedrails?: A Little Help needed moving from lying on your back to sitting on the side of a flat bed without using bedrails?: None Help needed moving to and from a bed to a chair (including a wheelchair)?: A Little Help needed standing up from a chair using your arms (e.g., wheelchair or bedside chair)?: None Help needed to walk in hospital room?: A Little Help needed climbing 3-5 steps with a railing? : A Lot 6 Click Score: 19    End of Session Equipment Utilized During Treatment: Gait belt Activity Tolerance: Patient limited by pain;Patient tolerated treatment well Patient left: in chair;with call bell/phone within reach Nurse Communication: Mobility status PT Visit Diagnosis: Other abnormalities of gait and mobility (R26.89);Muscle weakness (generalized) (M62.81);Pain Pain - Right/Left: Left Pain - part of body: Leg     Time: 1000-1022 PT Time Calculation (min) (ACUTE ONLY): 22 min  Charges:  $Gait Training: 8-22 mins                       Carney Living PT DPT  03/02/2019, 11:16 AM

## 2019-03-02 NOTE — Discharge Summary (Signed)
Physician Discharge Summary  Patient ID: Carol Love MRN: [id number] DOB/AGE: [birth year] 68 y.o.  Admit date: 02/24/2019 Discharge date: 03/02/2019  Admission Diagnoses:  Discharge Diagnoses:  Active Problems:   Degenerative spondylolisthesis   Discharged Condition: good  Hospital Course: Patient admitted to the hospital where she underwent uncomplicated two-level lumbar decompression and fusion.  Postoperatively she progressed slowly with significant postoperative pain and difficulty with mobility.  This is gradually progressed and improved over time.  She is now standing and walking with minimal difficulty.  Her pain is much improved.  She does feel little bit of weakness in her proximal left thigh.  She has no sensory abnormality.  She is voiding well.  She is ready for discharge home.  Consults:   Significant Diagnostic Studies:   Treatments:   Discharge Exam: Blood pressure 125/87, pulse 66, temperature 98.2 F (36.8 C), temperature source Oral, resp. rate 17, height 4' 11.25" (1.505 m), weight 62.6 kg, SpO2 95 %. Awake and alert.  Oriented and appropriate.  Cranial nerve function intact.  Motor and sensory function extremities intact except for some 4/5 left hip flexor weakness.  Sensory examination nonfocal.  Wound clean and dry.  Chest and abdomen benign.  Disposition: Discharge disposition: 01-Home or Self Care       Discharge Instructions    Face-to-face encounter (required for Medicare/Medicaid patients)   Complete by:  As directed    I Charlie Pitter certify that this patient is under my care and that I, or a nurse practitioner or physician's assistant working with me, had a face-to-face encounter that meets the physician face-to-face encounter requirements with this patient on 03/02/2019. The encounter with the patient was in whole, or in part for the following medical condition(s) which is the primary reason for home health care (List medical condition): degenerative  spondylolisthesis   The encounter with the patient was in whole, or in part, for the following medical condition, which is the primary reason for home health care:  degenerative spondylolithesis   I certify that, based on my findings, the following services are medically necessary home health services:  Physical therapy   Reason for Medically Necessary Home Health Services:  Therapy- Home Adaptation to Facilitate Safety   My clinical findings support the need for the above services:  Unable to leave home safely without assistance and/or assistive device   Further, I certify that my clinical findings support that this patient is homebound due to:  Pain interferes with ambulation/mobility   Home Health   Complete by:  As directed    To provide the following care/treatments:   PT OT       Allergies as of 03/02/2019      Reactions   Epinephrine Other (See Comments)   Gets migraines   Ibuprofen Itching, Swelling   Penicillins Rash   Did it involve swelling of the face/tongue/throat, SOB, or low BP? Unknown Did it involve sudden or severe rash/hives, skin peeling, or any reaction on the inside of your mouth or nose? No Did you need to seek medical attention at a hospital or doctor's office? Unknown When did it last happen?Childhood allergy If all above answers are "NO", may proceed with cephalosporin use.   Sulfa Antibiotics Rash      Medication List    TAKE these medications   acebutolol 200 MG capsule Commonly known as:  SECTRAL Take 1 capsule (200 mg total) by mouth daily.   ALPRAZolam 0.5 MG tablet Commonly known as:  Duanne Moron  Take 0.5-1 mg by mouth See admin instructions. Take 0.5 mg in the morning and 1 mg in the evening   amitriptyline 50 MG tablet Commonly known as:  ELAVIL Take 50 mg by mouth at bedtime.   aspirin EC 81 MG tablet Take 81 mg by mouth every evening.   atorvastatin 40 MG tablet Commonly known as:  LIPITOR Take 1 tablet (40 mg total) by mouth  daily. What changed:  when to take this   CALCIUM + D PO Take 1 tablet by mouth daily.   cyclobenzaprine 10 MG tablet Commonly known as:  FLEXERIL Take 10 mg by mouth 3 (three) times daily.   diazepam 5 MG tablet Commonly known as:  VALIUM Take 1-2 tablets (5-10 mg total) by mouth every 6 (six) hours as needed for muscle spasms.   gabapentin 300 MG capsule Commonly known as:  NEURONTIN Take 1,200 mg by mouth at bedtime.   lactase 3000 units tablet Commonly known as:  LACTAID Take 6,000 Units by mouth daily.   meloxicam 15 MG tablet Commonly known as:  MOBIC Take 15 mg by mouth daily.   METAMUCIL PO Take 1 Scoop by mouth daily as needed.   MIRALAX PO Take 1 Scoop by mouth daily as needed. 1 tsp   multivitamin capsule Take 1 capsule by mouth daily.   Oxycodone HCl 10 MG Tabs Take 1 tablet (10 mg total) by mouth every 3 (three) hours as needed for severe pain ((score 7 to 10)).   sertraline 100 MG tablet Commonly known as:  ZOLOFT Take 100 mg by mouth daily.   traMADol 50 MG tablet Commonly known as:  ULTRAM Take 50 mg by mouth 2 (two) times daily as needed for moderate pain.   triamcinolone cream 0.1 % Commonly known as:  KENALOG Apply 1 application topically daily as needed (contact dermatitis).            Durable Medical Equipment  (From admission, onward)         Start     Ordered   02/24/19 1759  DME Walker rolling  Once    Question:  Patient needs a walker to treat with the following condition  Answer:  Degenerative spondylolisthesis   02/24/19 1758   02/24/19 1759  DME 3 n 1  Once     02/24/19 1758           Signed: Mallie Mussel A Luanne Krzyzanowski 03/02/2019, 11:52 AM

## 2019-03-02 NOTE — Discharge Instructions (Signed)

## 2019-03-02 NOTE — Progress Notes (Addendum)
Patient states she does not need a walker or 3 n 1.  Charge RN notified patient needs HHPT order from MD  Prior to dc.

## 2019-03-02 NOTE — Progress Notes (Signed)
IV discontinued without complication. Pt education provided and all questions answered. Pt discharged from facility via wheelchair

## 2019-03-05 DIAGNOSIS — Z981 Arthrodesis status: Secondary | ICD-10-CM | POA: Diagnosis not present

## 2019-03-05 DIAGNOSIS — F329 Major depressive disorder, single episode, unspecified: Secondary | ICD-10-CM | POA: Diagnosis not present

## 2019-03-05 DIAGNOSIS — Z4789 Encounter for other orthopedic aftercare: Secondary | ICD-10-CM | POA: Diagnosis not present

## 2019-03-05 DIAGNOSIS — E7800 Pure hypercholesterolemia, unspecified: Secondary | ICD-10-CM | POA: Diagnosis not present

## 2019-03-05 DIAGNOSIS — I2510 Atherosclerotic heart disease of native coronary artery without angina pectoris: Secondary | ICD-10-CM | POA: Diagnosis not present

## 2019-03-05 DIAGNOSIS — I252 Old myocardial infarction: Secondary | ICD-10-CM | POA: Diagnosis not present

## 2019-03-05 DIAGNOSIS — L409 Psoriasis, unspecified: Secondary | ICD-10-CM | POA: Diagnosis not present

## 2019-03-05 DIAGNOSIS — K5900 Constipation, unspecified: Secondary | ICD-10-CM | POA: Diagnosis not present

## 2019-03-05 DIAGNOSIS — F419 Anxiety disorder, unspecified: Secondary | ICD-10-CM | POA: Diagnosis not present

## 2019-03-05 DIAGNOSIS — I10 Essential (primary) hypertension: Secondary | ICD-10-CM | POA: Diagnosis not present

## 2019-03-05 DIAGNOSIS — M1990 Unspecified osteoarthritis, unspecified site: Secondary | ICD-10-CM | POA: Diagnosis not present

## 2019-03-05 DIAGNOSIS — Z7982 Long term (current) use of aspirin: Secondary | ICD-10-CM | POA: Diagnosis not present

## 2019-03-05 DIAGNOSIS — M48061 Spinal stenosis, lumbar region without neurogenic claudication: Secondary | ICD-10-CM | POA: Diagnosis not present

## 2019-03-08 ENCOUNTER — Encounter: Payer: Self-pay | Admitting: Cardiology

## 2019-03-08 ENCOUNTER — Telehealth: Payer: Self-pay

## 2019-03-08 ENCOUNTER — Other Ambulatory Visit: Payer: Self-pay

## 2019-03-08 ENCOUNTER — Telehealth (INDEPENDENT_AMBULATORY_CARE_PROVIDER_SITE_OTHER): Payer: Medicare Other | Admitting: Cardiology

## 2019-03-08 VITALS — BP 93/68 | HR 77 | Ht 59.25 in | Wt 137.0 lb

## 2019-03-08 DIAGNOSIS — I2510 Atherosclerotic heart disease of native coronary artery without angina pectoris: Secondary | ICD-10-CM | POA: Diagnosis not present

## 2019-03-08 DIAGNOSIS — I10 Essential (primary) hypertension: Secondary | ICD-10-CM

## 2019-03-08 DIAGNOSIS — E7800 Pure hypercholesterolemia, unspecified: Secondary | ICD-10-CM

## 2019-03-08 DIAGNOSIS — R9439 Abnormal result of other cardiovascular function study: Secondary | ICD-10-CM | POA: Diagnosis not present

## 2019-03-08 MED ORDER — METOPROLOL TARTRATE 50 MG PO TABS: 100.0000 mg | ORAL_TABLET | Freq: Once | ORAL | 0 refills | Status: DC

## 2019-03-08 NOTE — Addendum Note (Signed)
Addended by: Beckey Rutter on: 03/08/2019 04:08 PM   Modules accepted: Orders

## 2019-03-08 NOTE — Telephone Encounter (Signed)
-----   Message from Jenean Lindau, MD sent at 03/05/2019  4:45 PM EDT ----- Patient needs appointment early next week to discuss results. Jenean Lindau, MD 03/05/2019 4:45 PM

## 2019-03-08 NOTE — Telephone Encounter (Signed)
Patient informed/ok with video f/u today. Scheduled for virtual visit.

## 2019-03-08 NOTE — Addendum Note (Signed)
Addended by: Beckey Rutter on: 03/08/2019 04:14 PM   Modules accepted: Orders

## 2019-03-08 NOTE — Patient Instructions (Addendum)

## 2019-03-08 NOTE — Progress Notes (Signed)
Virtual Visit via Video Note   This visit type was conducted due to national recommendations for restrictions regarding the COVID-19 Pandemic (e.g. social distancing) in an effort to limit this patient's exposure and mitigate transmission in our community.  Due to her co-morbid illnesses, this patient is at least at moderate risk for complications without adequate follow up.  This format is felt to be most appropriate for this patient at this time.  All issues noted in this document were discussed and addressed.  A limited physical exam was performed with this format.  Please refer to the patient's chart for her consent to telehealth for [facility]   Date:  03/08/2019   ID:  Carol Love, DOB [birth year], MRN [id number]  Patient Location: Home Provider Location: Home  PCP:  Levin Erp, MD  Cardiologist:  No primary care provider on file.  Electrophysiologist:  None   Evaluation Performed:  Follow-Up Visit  Chief Complaint: Coronary artery disease for follow-up  History of Present Illness:    Carol Love is a 68 y.o. female with past medical history of coronary artery disease, essential hypertension, right coronary artery stenting, dyslipidemia.  Patient was evaluated by me for preoperative purposes and had abnormal stress test.  On the angiography revealed right coronary artery stent being patent and proximal LAD had 60 to 70% nonobstructive stenosis.  Subsequently because of coronavirus pandemic her surgery was delayed but she had it in the month of June and feeling better.  No chest pain orthopnea or PND.  She is ambulating well and uses aspirin on a daily basis.  At the time of my evaluation, the patient is alert awake oriented and in no distress.  The patient does not have symptoms concerning for COVID-19 infection (fever, chills, cough, or new shortness of breath).    Past Medical History:  Diagnosis Date  . Anxiety   . Arthritis   . Constipation    uses stool  softener with stimulant  . Coronary artery disease   . Depression   . Headache    last h/a  2001  . Hyperlipidemia   . Hypertension   . Irregular heart beat    hx - no problems since taking acebutolol  . Myocardial infarction [facility]) 2014   stent  . Psoriasis   . SVD (spontaneous vaginal delivery)    x 3   Past Surgical History:  Procedure Laterality Date  . APPENDECTOMY  05  . CARDIAC CATHETERIZATION  2014   stent  . CARDIOVASCULAR STRESS TEST  03/07/15  . COLONOSCOPY    . CORONARY ANGIOPLASTY     DES RCA 12/13/12  . CORONARY STENT PLACEMENT    . DILATION AND CURETTAGE OF UTERUS    . LEFT HEART CATH AND CORONARY ANGIOGRAPHY N/A 12/10/2018   Procedure: LEFT HEART CATH AND CORONARY ANGIOGRAPHY;  Surgeon: Belva Crome, MD;  Location: [place] [facility];  Service: Cardiovascular;  Laterality: N/A;  . MAXIMUM ACCESS (MAS)POSTERIOR LUMBAR INTERBODY FUSION (PLIF) 1 LEVEL N/A 03/20/2015   Procedure: FOR MAXIMUM ACCESS (MAS) POSTERIOR LUMBAR INTERBODY FUSION (PLIF) 1 LEVEL;  Surgeon: Earnie Larsson, MD;  Location: [place] [facility];  Service: Neurosurgery;  Laterality: N/A;  FOR MAXIMUM ACCESS (MAS) POSTERIOR LUMBAR INTERBODY FUSION (PLIF) 1 LEVEL LUMBAR FOUR-FIVE  . NASAL SEPTUM SURGERY  1985   s/p fall  . SHOULDER ARTHROSCOPY Left 08  . TONSILLECTOMY    . TOOTH EXTRACTION     4 teeth pulled prior to braces as a  child  . WISDOM TOOTH EXTRACTION       Current Meds  Medication Sig  . acebutolol (SECTRAL) 200 MG capsule Take 1 capsule (200 mg total) by mouth daily.  Marland Kitchen ALPRAZolam (XANAX) 0.5 MG tablet Take 0.5-1 mg by mouth See admin instructions. Take 0.5 mg in the morning and 1 mg in the evening  . amitriptyline (ELAVIL) 50 MG tablet Take 50 mg by mouth at bedtime.  Marland Kitchen aspirin EC 81 MG tablet Take 81 mg by mouth every evening.  . Calcium Citrate-Vitamin D (CALCIUM + D PO) Take 1 tablet by mouth daily.  . diazepam (VALIUM) 5 MG tablet Take 1-2 tablets (5-10 mg total) by mouth every 6 (six)  hours as needed for muscle spasms.  Marland Kitchen gabapentin (NEURONTIN) 300 MG capsule Take 1,200 mg by mouth at bedtime.   Marland Kitchen lactase (LACTAID) 3000 units tablet Take 6,000 Units by mouth as needed.   . meloxicam (MOBIC) 15 MG tablet Take 15 mg by mouth as needed.   . Multiple Vitamin (MULTIVITAMIN) capsule Take 1 capsule by mouth daily.  Marland Kitchen oxyCODONE 10 MG TABS Take 1 tablet (10 mg total) by mouth every 3 (three) hours as needed for severe pain ((score 7 to 10)).  . Polyethylene Glycol 3350 (MIRALAX PO) Take 1 Scoop by mouth daily as needed. 1 tsp  . sertraline (ZOLOFT) 100 MG tablet Take 100 mg by mouth daily.  . traMADol (ULTRAM) 50 MG tablet Take 50 mg by mouth 2 (two) times daily as needed for moderate pain.   Marland Kitchen triamcinolone cream (KENALOG) 0.1 % Apply 1 application topically daily as needed (contact dermatitis).     Allergies:   Epinephrine, Ibuprofen, Penicillins, and Sulfa antibiotics   Social History   Tobacco Use  . Smoking status: Former Smoker    Packs/day: 0.50    Years: 45.00    Pack years: 22.50    Types: Cigarettes    Quit date: 10/09/2012    Years since quitting: 6.4  . Smokeless tobacco: Never Used  Substance Use Topics  . Alcohol use: Yes    Alcohol/week: 4.0 - 6.0 standard drinks    Types: 4 - 6 Glasses of wine per week    Comment: several times a week- 1-2 glasses of wine   . Drug use: No     Family Hx: The patient's family history includes AAA (abdominal aortic aneurysm) in her mother; Heart disease in her father and mother; Hyperlipidemia in her mother; Lung cancer in her mother. There is no history of Colon cancer, Colon polyps, Esophageal cancer, Rectal cancer, or Stomach cancer.  ROS:   Please see the history of present illness.    As mentioned above All other systems reviewed and are negative. LEFT HEART CATH AND CORONARY ANGIOGRAPHY  Conclusion   Widely patent left main coronary artery  Proximal eccentric 60 to 70% LAD with TIMI grade III flow and no  indication for treatment.  30% proximal to mid circumflex.  Located within a region of calcification.  Widely patent proximal to mid RCA stent  Normal LV systolic function EF 29% and LVEDP 18 mmHg.  RECOMMENDATIONS:   Cleared for upcoming lumbar surgery.  Continue aggressive secondary risk factor modification.  In absence of symptoms, no indication for interventional/revascularization therapy.     Prior CV studies:   The following studies were reviewed today:   Labs/Other Tests and Data Reviewed:    EKG:  No ECG reviewed.  Recent Labs: 11/30/2018: ALT 39 02/17/2019: BUN 11;  Creatinine, Ser 0.93; Hemoglobin 13.7; Platelets 216; Potassium 4.4; Sodium 141   Recent Lipid Panel Lab Results  Component Value Date/Time   CHOL 163 11/30/2018 12:31 PM   TRIG 250 (H) 11/30/2018 12:31 PM   HDL 49 11/30/2018 12:31 PM   CHOLHDL 3.3 11/30/2018 12:31 PM   LDLCALC 64 11/30/2018 12:31 PM    Wt Readings from Last 3 Encounters:  03/08/19 137 lb (62.1 kg)  02/24/19 138 lb (62.6 kg)  02/17/19 143 lb 14.4 oz (65.3 kg)     Objective:    Vital Signs:  BP 93/68 (BP Location: Left Arm, Patient Position: Sitting, Cuff Size: Normal)   Pulse 77   Ht 4' 11.25" (1.505 m)   Wt 137 lb (62.1 kg)   LMP  (LMP Unknown)   BMI 27.44 kg/m    VITAL SIGNS:  reviewed  ASSESSMENT & PLAN:    1. Coronary artery disease: Details of coronary angiography were mentioned above.  Secondary prevention stressed with the patient.  Importance of compliance with diet and medication stressed and she vocalized understanding.  Importance of regular ambulation and exercise stressed. 2. Sublingual nitroglycerin prescription was sent, its protocol and 911 protocol explained and the patient vocalized understanding questions were answered to the patient's satisfaction 3. Essential hypertension: Blood pressure stable 4. She had blood work with her primary care physician recently including lipids and we will try to  obtain a copy of this 5. Patient will be seen in follow-up appointment in 4 months or earlier if the patient has any concerns   COVID-19 Education: The signs and symptoms of COVID-19 were discussed with the patient and how to seek care for testing (follow up with PCP or arrange E-visit).  The importance of social distancing was discussed today.  Time:   Today, I have spent 15 minutes with the patient with telehealth technology discussing the above problems.     Medication Adjustments/Labs and Tests Ordered: Current medicines are reviewed at length with the patient today.  Concerns regarding medicines are outlined above.   Tests Ordered: No orders of the defined types were placed in this encounter.   Medication Changes: No orders of the defined types were placed in this encounter.   Follow Up:  Virtual Visit or In Person in 4 month(s)  Signed, Jenean Lindau, MD  03/08/2019 3:01 PM    [place]

## 2019-03-16 ENCOUNTER — Other Ambulatory Visit: Payer: Self-pay | Admitting: Cardiology

## 2019-04-14 DIAGNOSIS — R030 Elevated blood-pressure reading, without diagnosis of hypertension: Secondary | ICD-10-CM | POA: Diagnosis not present

## 2019-04-14 DIAGNOSIS — M4316 Spondylolisthesis, lumbar region: Secondary | ICD-10-CM | POA: Diagnosis not present

## 2019-05-14 ENCOUNTER — Ambulatory Visit: Payer: Medicare Other | Admitting: Cardiology

## 2019-06-02 DIAGNOSIS — M4316 Spondylolisthesis, lumbar region: Secondary | ICD-10-CM | POA: Diagnosis not present

## 2019-06-02 DIAGNOSIS — Z6826 Body mass index (BMI) 26.0-26.9, adult: Secondary | ICD-10-CM | POA: Diagnosis not present

## 2019-06-02 DIAGNOSIS — I10 Essential (primary) hypertension: Secondary | ICD-10-CM | POA: Diagnosis not present

## 2019-06-16 ENCOUNTER — Other Ambulatory Visit: Payer: Self-pay | Admitting: Cardiology

## 2019-06-16 NOTE — Telephone Encounter (Signed)
Rx refill sent to pharmacy. 

## 2019-07-15 DIAGNOSIS — Z1231 Encounter for screening mammogram for malignant neoplasm of breast: Secondary | ICD-10-CM | POA: Diagnosis not present

## 2019-07-19 ENCOUNTER — Other Ambulatory Visit: Payer: Self-pay

## 2019-07-19 ENCOUNTER — Ambulatory Visit (INDEPENDENT_AMBULATORY_CARE_PROVIDER_SITE_OTHER): Payer: Medicare Other | Admitting: Cardiology

## 2019-07-19 ENCOUNTER — Encounter: Payer: Self-pay | Admitting: Cardiology

## 2019-07-19 VITALS — BP 124/72 | HR 70 | Ht 59.4 in | Wt 136.0 lb

## 2019-07-19 DIAGNOSIS — I2510 Atherosclerotic heart disease of native coronary artery without angina pectoris: Secondary | ICD-10-CM

## 2019-07-19 DIAGNOSIS — I10 Essential (primary) hypertension: Secondary | ICD-10-CM | POA: Diagnosis not present

## 2019-07-19 DIAGNOSIS — E7800 Pure hypercholesterolemia, unspecified: Secondary | ICD-10-CM

## 2019-07-19 NOTE — Patient Instructions (Signed)

## 2019-07-19 NOTE — Progress Notes (Signed)
Cardiology Office Note:    Date:  07/19/2019   ID:  Carol Love, DOB 01-Apr-1951, MRN [id number]  PCP:  Levin Erp, MD  Cardiologist:  Jenean Lindau, MD   Referring MD: Levin Erp, MD    ASSESSMENT:    1. Coronary artery disease involving native coronary artery of native heart without angina pectoris   2. Essential hypertension   3. Hypercholesterolemia    PLAN:    In order of problems listed above:  1. Coronary artery disease: Secondary prevention stressed with the patient.  Importance of compliance with diet and medication stressed and she vocalized understanding. 2. Hypertension: Her blood pressure stable 3. Mixed dyslipidemia: Diet was discussed and she will have blood work with her primary care physician in the next few days. 4. Patient will be seen in follow-up appointment in 6 months or earlier if the patient has any concerns    Medication Adjustments/Labs and Tests Ordered: Current medicines are reviewed at length with the patient today.  Concerns regarding medicines are outlined above.  No orders of the defined types were placed in this encounter.  No orders of the defined types were placed in this encounter.    No chief complaint on file.    History of Present Illness:    Carol Love is a 68 y.o. female.  Patient has past medical history of coronary artery disease, essential hypertension and dyslipidemia.  She denies any problems at this time and takes care of activities of daily living.  No chest pain orthopnea or PND.  She walks on a regular basis.  She is here for routine follow-up.  At the time of my evaluation, the patient is alert awake oriented and in no distress.  Past Medical History:  Diagnosis Date  . Anxiety   . Arthritis   . Constipation    uses stool softener with stimulant  . Coronary artery disease   . Depression   . Headache    last h/a  2001  . Hyperlipidemia   . Hypertension   . Irregular heart beat    hx - no  problems since taking acebutolol  . Myocardial infarction [facility]) 2014   stent  . Psoriasis   . SVD (spontaneous vaginal delivery)    x 3    Past Surgical History:  Procedure Laterality Date  . APPENDECTOMY  05  . CARDIAC CATHETERIZATION  2014   stent  . CARDIOVASCULAR STRESS TEST  03/07/15  . COLONOSCOPY    . CORONARY ANGIOPLASTY     DES RCA 12/13/12  . CORONARY STENT PLACEMENT    . DILATION AND CURETTAGE OF UTERUS    . LEFT HEART CATH AND CORONARY ANGIOGRAPHY N/A 12/10/2018   Procedure: LEFT HEART CATH AND CORONARY ANGIOGRAPHY;  Surgeon: Belva Crome, MD;  Location: [place] [facility];  Service: Cardiovascular;  Laterality: N/A;  . MAXIMUM ACCESS (MAS)POSTERIOR LUMBAR INTERBODY FUSION (PLIF) 1 LEVEL N/A 03/20/2015   Procedure: FOR MAXIMUM ACCESS (MAS) POSTERIOR LUMBAR INTERBODY FUSION (PLIF) 1 LEVEL;  Surgeon: Earnie Larsson, MD;  Location: [place] [facility];  Service: Neurosurgery;  Laterality: N/A;  FOR MAXIMUM ACCESS (MAS) POSTERIOR LUMBAR INTERBODY FUSION (PLIF) 1 LEVEL LUMBAR FOUR-FIVE  . NASAL SEPTUM SURGERY  1985   s/p fall  . SHOULDER ARTHROSCOPY Left 08  . TONSILLECTOMY    . TOOTH EXTRACTION     4 teeth pulled prior to braces as a child  . WISDOM TOOTH EXTRACTION      Current Medications: Current  Meds  Medication Sig  . acebutolol (SECTRAL) 200 MG capsule TAKE 1 CAPSULE BY MOUTH DAILY  . ALPRAZolam (XANAX) 0.5 MG tablet Take 0.5-1 mg by mouth See admin instructions. Take 0.5 mg in the morning and 1 mg in the evening  . amitriptyline (ELAVIL) 50 MG tablet Take 50 mg by mouth at bedtime.  Marland Kitchen aspirin EC 81 MG tablet Take 81 mg by mouth every evening.  Marland Kitchen atorvastatin (LIPITOR) 40 MG tablet Take 1 tablet (40 mg total) by mouth every evening.  . Calcium Citrate-Vitamin D (CALCIUM + D PO) Take 1 tablet by mouth daily.  . diazepam (VALIUM) 5 MG tablet Take 1-2 tablets (5-10 mg total) by mouth every 6 (six) hours as needed for muscle spasms.  Marland Kitchen lactase (LACTAID) 3000 units tablet Take  6,000 Units by mouth as needed.   . Multiple Vitamin (MULTIVITAMIN) capsule Take 1 capsule by mouth daily.  . Polyethylene Glycol 3350 (MIRALAX PO) Take 1 Scoop by mouth daily as needed. 1 tsp  . sertraline (ZOLOFT) 100 MG tablet Take 100 mg by mouth daily.  Marland Kitchen triamcinolone cream (KENALOG) 0.1 % Apply 1 application topically daily as needed (contact dermatitis).  . [DISCONTINUED] gabapentin (NEURONTIN) 300 MG capsule Take 1,200 mg by mouth at bedtime.   . [DISCONTINUED] meloxicam (MOBIC) 15 MG tablet Take 15 mg by mouth as needed.   . [DISCONTINUED] oxyCODONE 10 MG TABS Take 1 tablet (10 mg total) by mouth every 3 (three) hours as needed for severe pain ((score 7 to 10)).  . [DISCONTINUED] traMADol (ULTRAM) 50 MG tablet Take 50 mg by mouth 2 (two) times daily as needed for moderate pain.      Allergies:   Epinephrine, Ibuprofen, Penicillins, and Sulfa antibiotics   Social History   Socioeconomic History  . Marital status: Married    Spouse name: Not on file  . Number of children: 3  . Years of education: Not on file  . Highest education level: Not on file  Occupational History  . Not on file  Social Needs  . Financial resource strain: Not on file  . Food insecurity    Worry: Not on file    Inability: Not on file  . Transportation needs    Medical: Not on file    Non-medical: Not on file  Tobacco Use  . Smoking status: Former Smoker    Packs/day: 0.50    Years: 45.00    Pack years: 22.50    Types: Cigarettes    Quit date: 10/09/2012    Years since quitting: 6.7  . Smokeless tobacco: Never Used  Substance and Sexual Activity  . Alcohol use: Yes    Alcohol/week: 4.0 - 6.0 standard drinks    Types: 4 - 6 Glasses of wine per week    Comment: several times a week- 1-2 glasses of wine   . Drug use: No  . Sexual activity: Not on file  Lifestyle  . Physical activity    Days per week: Not on file    Minutes per session: Not on file  . Stress: Not on file  Relationships  .  Social Herbalist on phone: Not on file    Gets together: Not on file    Attends religious service: Not on file    Active member of club or organization: Not on file    Attends meetings of clubs or organizations: Not on file    Relationship status: Not on file  Other Topics Concern  .  Not on file  Social History Narrative  . Not on file     Family History: The patient's family history includes AAA (abdominal aortic aneurysm) in her mother; Heart disease in her father and mother; Hyperlipidemia in her mother; Lung cancer in her mother. There is no history of Colon cancer, Colon polyps, Esophageal cancer, Rectal cancer, or Stomach cancer.  ROS:   Please see the history of present illness.    All other systems reviewed and are negative.  EKGs/Labs/Other Studies Reviewed:    The following studies were reviewed today: I discussed my findings with the patient at length including labs from last time   Recent Labs: 11/30/2018: ALT 39 02/17/2019: BUN 11; Creatinine, Ser 0.93; Hemoglobin 13.7; Platelets 216; Potassium 4.4; Sodium 141  Recent Lipid Panel    Component Value Date/Time   CHOL 163 11/30/2018 1231   TRIG 250 (H) 11/30/2018 1231   HDL 49 11/30/2018 1231   CHOLHDL 3.3 11/30/2018 1231   LDLCALC 64 11/30/2018 1231    Physical Exam:    VS:  BP 124/72 (BP Location: Left Arm, Patient Position: Sitting, Cuff Size: Normal)   Pulse 70   Ht 4' 11.4" (1.509 m)   Wt 136 lb (61.7 kg)   LMP  (LMP Unknown)   SpO2 99%   BMI 27.10 kg/m     Wt Readings from Last 3 Encounters:  07/19/19 136 lb (61.7 kg)  03/08/19 137 lb (62.1 kg)  02/24/19 138 lb (62.6 kg)     GEN: Patient is in no acute distress HEENT: Normal NECK: No JVD; No carotid bruits LYMPHATICS: No lymphadenopathy CARDIAC: Hear sounds regular, 2/6 systolic murmur at the apex. RESPIRATORY:  Clear to auscultation without rales, wheezing or rhonchi  ABDOMEN: Soft, non-tender, non-distended MUSCULOSKELETAL:  No  edema; No deformity  SKIN: Warm and dry NEUROLOGIC:  Alert and oriented x 3 PSYCHIATRIC:  Normal affect   Signed, Jenean Lindau, MD  07/19/2019 11:57 AM    [place]

## 2019-08-10 DIAGNOSIS — F419 Anxiety disorder, unspecified: Secondary | ICD-10-CM | POA: Diagnosis not present

## 2019-08-10 DIAGNOSIS — I2510 Atherosclerotic heart disease of native coronary artery without angina pectoris: Secondary | ICD-10-CM | POA: Diagnosis not present

## 2019-08-10 DIAGNOSIS — I10 Essential (primary) hypertension: Secondary | ICD-10-CM | POA: Diagnosis not present

## 2019-08-10 DIAGNOSIS — E789 Disorder of lipoprotein metabolism, unspecified: Secondary | ICD-10-CM | POA: Diagnosis not present

## 2019-08-11 DIAGNOSIS — I10 Essential (primary) hypertension: Secondary | ICD-10-CM | POA: Diagnosis not present

## 2019-09-01 DIAGNOSIS — M4316 Spondylolisthesis, lumbar region: Secondary | ICD-10-CM | POA: Diagnosis not present

## 2019-09-13 ENCOUNTER — Other Ambulatory Visit: Payer: Self-pay | Admitting: Cardiology

## 2019-12-15 ENCOUNTER — Other Ambulatory Visit: Payer: Self-pay | Admitting: Cardiology

## 2019-12-28 ENCOUNTER — Ambulatory Visit: Payer: Medicare Other | Admitting: Cardiology

## 2019-12-28 ENCOUNTER — Encounter: Payer: Self-pay | Admitting: Cardiology

## 2019-12-28 ENCOUNTER — Other Ambulatory Visit: Payer: Self-pay

## 2019-12-28 VITALS — BP 128/80 | HR 65 | Ht 59.4 in | Wt 133.0 lb

## 2019-12-28 DIAGNOSIS — I10 Essential (primary) hypertension: Secondary | ICD-10-CM

## 2019-12-28 DIAGNOSIS — E7800 Pure hypercholesterolemia, unspecified: Secondary | ICD-10-CM

## 2019-12-28 DIAGNOSIS — I2510 Atherosclerotic heart disease of native coronary artery without angina pectoris: Secondary | ICD-10-CM

## 2019-12-28 DIAGNOSIS — R3129 Other microscopic hematuria: Secondary | ICD-10-CM | POA: Diagnosis not present

## 2019-12-28 MED ORDER — NITROGLYCERIN 0.4 MG SL SUBL: 0.4000 mg | SUBLINGUAL_TABLET | SUBLINGUAL | 3 refills | Status: DC | PRN

## 2019-12-28 NOTE — Patient Instructions (Signed)
Medication Instructions:  Your physician has recommended you make the following change in your medication:   Take nitroglycerin as needed for chest pain.  *If you need a refill on your cardiac medications before your next appointment, please call your pharmacy*   Lab Work: None ordered If you have labs (blood work) drawn today and your tests are completely normal, you will receive your results only by: Marland Kitchen MyChart Message (if you have MyChart) OR . A paper copy in the mail If you have any lab test that is abnormal or we need to change your treatment, we will call you to review the results.   Testing/Procedures: None ordered   Follow-Up: At [facility], you and your health needs are our priority.  As part of our continuing mission to provide you with exceptional heart care, we have created designated Provider Care Teams.  These Care Teams include your primary Cardiologist (physician) and Advanced Practice Providers (APPs -  Physician Assistants and Nurse Practitioners) who all work together to provide you with the care you need, when you need it.  We recommend signing up for the patient portal called "MyChart".  Sign up information is provided on this After Visit Summary.  MyChart is used to connect with patients for Virtual Visits (Telemedicine).  Patients are able to view lab/test results, encounter notes, upcoming appointments, etc.  Non-urgent messages can be sent to your provider as well.   To learn more about what you can do with MyChart, go to [email or web address].    Your next appointment:   6 month(s)  The format for your next appointment:   In Person  Provider:   Jyl Heinz, MD   Other Instructions NA

## 2019-12-28 NOTE — Progress Notes (Signed)
Cardiology Office Note:    Date:  12/28/2019   ID:  Carol Love, DOB 05/14/1951, MRN [id number]  PCP:  Levin Erp, MD  Cardiologist:  Jenean Lindau, MD   Referring MD: Levin Erp, MD    ASSESSMENT:    1. Coronary artery disease involving native coronary artery of native heart without angina pectoris   2. Essential hypertension   3. Hypercholesterolemia    PLAN:    In order of problems listed above:  1. Coronary artery disease: Secondary prevention stressed with the patient.  Importance of compliance with diet and medication stressed and she vocalized understanding.  I told her to walk on a regular basis at least 30 minutes a day 5 times a week as her back permits and she promises to do so. 2. Essential hypertension: Blood pressure is stable.  Diet was discussed her blood pressure readings at home are fine 3. Mixed dyslipidemia: Recently her primary care physician did blood work.  He is retired now.  She will get me a copy of the blood work mailed out to my office. 4. Patient will be seen in follow-up appointment in 6 months or earlier if the patient has any concerns    Medication Adjustments/Labs and Tests Ordered: Current medicines are reviewed at length with the patient today.  Concerns regarding medicines are outlined above.  No orders of the defined types were placed in this encounter.  No orders of the defined types were placed in this encounter.    Chief Complaint  Patient presents with  . Follow-up    6 Months     History of Present Illness:    Carol Love is a 69 y.o. female.  Patient has past medical history of coronary artery disease and coronary angiography report from last year is detailed below, essential hypertension, dyslipidemia.  She has undergone surgery for spinal stenosis and is doing well.  Unfortunately she leads a sedentary lifestyle.  No chest pain orthopnea or PND.  At the time of my evaluation, the patient is alert awake oriented  and in no distress.  Past Medical History:  Diagnosis Date  . Anxiety   . Arthritis   . Constipation    uses stool softener with stimulant  . Coronary artery disease   . Depression   . Headache    last h/a  2001  . Hyperlipidemia   . Hypertension   . Irregular heart beat    hx - no problems since taking acebutolol  . Myocardial infarction [facility]) 2014   stent  . Psoriasis   . SVD (spontaneous vaginal delivery)    x 3    Past Surgical History:  Procedure Laterality Date  . APPENDECTOMY  05  . CARDIAC CATHETERIZATION  2014   stent  . CARDIOVASCULAR STRESS TEST  03/07/15  . COLONOSCOPY    . CORONARY ANGIOPLASTY     DES RCA 12/13/12  . CORONARY STENT PLACEMENT    . DILATION AND CURETTAGE OF UTERUS    . LEFT HEART CATH AND CORONARY ANGIOGRAPHY N/A 12/10/2018   Procedure: LEFT HEART CATH AND CORONARY ANGIOGRAPHY;  Surgeon: Belva Crome, MD;  Location: [place] [facility];  Service: Cardiovascular;  Laterality: N/A;  . MAXIMUM ACCESS (MAS)POSTERIOR LUMBAR INTERBODY FUSION (PLIF) 1 LEVEL N/A 03/20/2015   Procedure: FOR MAXIMUM ACCESS (MAS) POSTERIOR LUMBAR INTERBODY FUSION (PLIF) 1 LEVEL;  Surgeon: Earnie Larsson, MD;  Location: [place] [facility];  Service: Neurosurgery;  Laterality: N/A;  FOR MAXIMUM ACCESS (MAS)  POSTERIOR LUMBAR INTERBODY FUSION (PLIF) 1 LEVEL LUMBAR FOUR-FIVE  . NASAL SEPTUM SURGERY  1985   s/p fall  . SHOULDER ARTHROSCOPY Left 08  . TONSILLECTOMY    . TOOTH EXTRACTION     4 teeth pulled prior to braces as a child  . WISDOM TOOTH EXTRACTION      Current Medications: Current Meds  Medication Sig  . acebutolol (SECTRAL) 200 MG capsule TAKE 1 CAPSULE BY MOUTH DAILY  . ALPRAZolam (XANAX) 0.5 MG tablet Take 0.5-1 mg by mouth See admin instructions. Take 0.5 mg in the morning and 1 mg in the evening  . amitriptyline (ELAVIL) 50 MG tablet Take 50 mg by mouth at bedtime.  Marland Kitchen aspirin EC 81 MG tablet Take 81 mg by mouth every evening.  Marland Kitchen atorvastatin (LIPITOR) 40 MG tablet  TAKE 1 TABLET BY MOUTH EVERY EVENING  . Calcium Citrate-Vitamin D (CALCIUM + D PO) Take 1 tablet by mouth daily.  Marland Kitchen lactase (LACTAID) 3000 units tablet Take 6,000 Units by mouth as needed.   . meloxicam (MOBIC) 15 MG tablet Take 15 mg by mouth daily.  . Multiple Vitamin (MULTIVITAMIN) capsule Take 1 capsule by mouth daily.  . Polyethylene Glycol 3350 (MIRALAX PO) Take 1 Scoop by mouth daily as needed. 1 tsp  . sertraline (ZOLOFT) 100 MG tablet Take 100 mg by mouth daily.  . traMADol (ULTRAM) 50 MG tablet Take 50 mg by mouth every 6 (six) hours as needed.  . triamcinolone cream (KENALOG) 0.1 % Apply 1 application topically daily as needed (contact dermatitis).     Allergies:   Epinephrine, Ibuprofen, Penicillins, and Sulfa antibiotics   Social History   Socioeconomic History  . Marital status: Married    Spouse name: Not on file  . Number of children: 3  . Years of education: Not on file  . Highest education level: Not on file  Occupational History  . Not on file  Tobacco Use  . Smoking status: Former Smoker    Packs/day: 0.50    Years: 45.00    Pack years: 22.50    Types: Cigarettes    Quit date: 10/09/2012    Years since quitting: 7.2  . Smokeless tobacco: Never Used  Substance and Sexual Activity  . Alcohol use: Yes    Alcohol/week: 4.0 - 6.0 standard drinks    Types: 4 - 6 Glasses of wine per week    Comment: several times a week- 1-2 glasses of wine   . Drug use: No  . Sexual activity: Not on file  Other Topics Concern  . Not on file  Social History Narrative  . Not on file   Social Determinants of Health   Financial Resource Strain:   . Difficulty of Paying Living Expenses:   Food Insecurity:   . Worried About Charity fundraiser in the Last Year:   . Arboriculturist in the Last Year:   Transportation Needs:   . Film/video editor (Medical):   Marland Kitchen Lack of Transportation (Non-Medical):   Physical Activity:   . Days of Exercise per Week:   . Minutes of  Exercise per Session:   Stress:   . Feeling of Stress :   Social Connections:   . Frequency of Communication with Friends and Family:   . Frequency of Social Gatherings with Friends and Family:   . Attends Religious Services:   . Active Member of Clubs or Organizations:   . Attends Archivist Meetings:   .  Marital Status:      Family History: The patient's family history includes AAA (abdominal aortic aneurysm) in her mother; Heart disease in her father and mother; Hyperlipidemia in her mother; Lung cancer in her mother. There is no history of Colon cancer, Colon polyps, Esophageal cancer, Rectal cancer, or Stomach cancer.  ROS:   Please see the history of present illness.    All other systems reviewed and are negative.  EKGs/Labs/Other Studies Reviewed:    The following studies were reviewed today:  EKG done today reveals sinus rhythm and was within normal limits.   CT FFR ANALYSIS  CLINICAL DATA:  cad and abnormal stress test  FINDINGS: FFRct analysis was performed on the original cardiac CT angiogram dataset. Diagrammatic representation of the FFRct analysis is provided in a separate PDF document in PACS. This dictation was created using the PDF document and an interactive 3D model of the results. 3D model is not available in the EMR/PACS. Normal FFR range is >0.80.  1. Left Main: FFR = 0.98  2. LAD: Proximal FFR = 0.77, Mid FFR =0.76, Distal FFR = 0.71 3. LCX: Proximal FFR = 0.93, Distal FFR = 0.92, OM = 0.88 4. RCA: Could not be mapped by FFR  IMPRESSION: 1. CT FFR analysis showed indeterminate lesion by FFR in proximal LAD.   Electronically Signed   By: Cherlynn Kaiser   On: 12/07/2018 18:43   Conclusion   Widely patent left main coronary artery  Proximal eccentric 60 to 70% LAD with TIMI grade III flow and no indication for treatment.  30% proximal to mid circumflex.  Located within a region of calcification.  Widely patent  proximal to mid RCA stent  Normal LV systolic function EF 123456 and LVEDP 18 mmHg.  RECOMMENDATIONS:   Cleared for upcoming lumbar surgery.  Continue aggressive secondary risk factor modification.  In absence of symptoms, no indication for interventional/revascularization therapy.      Recent Labs: 02/17/2019: BUN 11; Creatinine, Ser 0.93; Hemoglobin 13.7; Platelets 216; Potassium 4.4; Sodium 141  Recent Lipid Panel    Component Value Date/Time   CHOL 163 11/30/2018 1231   TRIG 250 (H) 11/30/2018 1231   HDL 49 11/30/2018 1231   CHOLHDL 3.3 11/30/2018 1231   LDLCALC 64 11/30/2018 1231    Physical Exam:    VS:  BP 128/80   Pulse 65   Ht 4' 11.4" (1.509 m)   Wt 133 lb (60.3 kg)   LMP  (LMP Unknown)   SpO2 97%   BMI 26.50 kg/m     Wt Readings from Last 3 Encounters:  12/28/19 133 lb (60.3 kg)  07/19/19 136 lb (61.7 kg)  03/08/19 137 lb (62.1 kg)     GEN: Patient is in no acute distress HEENT: Normal NECK: No JVD; No carotid bruits LYMPHATICS: No lymphadenopathy CARDIAC: Hear sounds regular, 2/6 systolic murmur at the apex. RESPIRATORY:  Clear to auscultation without rales, wheezing or rhonchi  ABDOMEN: Soft, non-tender, non-distended MUSCULOSKELETAL:  No edema; No deformity  SKIN: Warm and dry NEUROLOGIC:  Alert and oriented x 3 PSYCHIATRIC:  Normal affect   Signed, Jenean Lindau, MD  12/28/2019 2:07 PM    [place]

## 2020-01-06 DIAGNOSIS — F17200 Nicotine dependence, unspecified, uncomplicated: Secondary | ICD-10-CM

## 2020-01-06 DIAGNOSIS — F411 Generalized anxiety disorder: Secondary | ICD-10-CM | POA: Insufficient documentation

## 2020-01-06 DIAGNOSIS — I2510 Atherosclerotic heart disease of native coronary artery without angina pectoris: Secondary | ICD-10-CM | POA: Diagnosis not present

## 2020-01-06 DIAGNOSIS — R739 Hyperglycemia, unspecified: Secondary | ICD-10-CM

## 2020-01-06 DIAGNOSIS — E663 Overweight: Secondary | ICD-10-CM

## 2020-01-06 DIAGNOSIS — Z1331 Encounter for screening for depression: Secondary | ICD-10-CM | POA: Diagnosis not present

## 2020-01-06 DIAGNOSIS — E7849 Other hyperlipidemia: Secondary | ICD-10-CM | POA: Diagnosis not present

## 2020-01-06 HISTORY — DX: Generalized anxiety disorder: F41.1

## 2020-01-06 HISTORY — DX: Nicotine dependence, unspecified, uncomplicated: F17.200

## 2020-01-06 HISTORY — DX: Overweight: E66.3

## 2020-01-06 HISTORY — DX: Hyperglycemia, unspecified: R73.9

## 2020-02-01 DIAGNOSIS — M85851 Other specified disorders of bone density and structure, right thigh: Secondary | ICD-10-CM | POA: Diagnosis not present

## 2020-02-01 DIAGNOSIS — M85852 Other specified disorders of bone density and structure, left thigh: Secondary | ICD-10-CM | POA: Diagnosis not present

## 2020-03-15 ENCOUNTER — Other Ambulatory Visit: Payer: Self-pay | Admitting: Cardiology

## 2020-04-26 DIAGNOSIS — S61211S Laceration without foreign body of left index finger without damage to nail, sequela: Secondary | ICD-10-CM | POA: Diagnosis not present

## 2020-05-30 DIAGNOSIS — Z0120 Encounter for dental examination and cleaning without abnormal findings: Secondary | ICD-10-CM | POA: Diagnosis not present

## 2020-06-15 ENCOUNTER — Other Ambulatory Visit: Payer: Self-pay

## 2020-06-15 MED ORDER — ATORVASTATIN CALCIUM 40 MG PO TABS: 40.0000 mg | ORAL_TABLET | Freq: Every evening | ORAL | 1 refills | Status: DC

## 2020-06-15 NOTE — Telephone Encounter (Signed)
Refill for Atorvastatin to [facility].

## 2020-07-20 DIAGNOSIS — Z1231 Encounter for screening mammogram for malignant neoplasm of breast: Secondary | ICD-10-CM | POA: Diagnosis not present

## 2020-08-15 DIAGNOSIS — R739 Hyperglycemia, unspecified: Secondary | ICD-10-CM | POA: Diagnosis not present

## 2020-08-15 DIAGNOSIS — E785 Hyperlipidemia, unspecified: Secondary | ICD-10-CM | POA: Diagnosis not present

## 2020-08-15 DIAGNOSIS — M859 Disorder of bone density and structure, unspecified: Secondary | ICD-10-CM | POA: Diagnosis not present

## 2020-08-22 DIAGNOSIS — F411 Generalized anxiety disorder: Secondary | ICD-10-CM | POA: Diagnosis not present

## 2020-08-22 DIAGNOSIS — M8580 Other specified disorders of bone density and structure, unspecified site: Secondary | ICD-10-CM | POA: Diagnosis not present

## 2020-08-22 DIAGNOSIS — Z0000 Encounter for general adult medical examination without abnormal findings: Secondary | ICD-10-CM | POA: Diagnosis not present

## 2020-08-22 DIAGNOSIS — I2510 Atherosclerotic heart disease of native coronary artery without angina pectoris: Secondary | ICD-10-CM | POA: Diagnosis not present

## 2020-08-23 ENCOUNTER — Other Ambulatory Visit: Payer: Self-pay | Admitting: Internal Medicine

## 2020-08-23 ENCOUNTER — Telehealth: Payer: Self-pay | Admitting: Cardiology

## 2020-08-23 DIAGNOSIS — Z87891 Personal history of nicotine dependence: Secondary | ICD-10-CM

## 2020-08-23 MED ORDER — ACEBUTOLOL HCL 200 MG PO CAPS: 200.0000 mg | ORAL_CAPSULE | Freq: Every day | ORAL | 3 refills | Status: AC

## 2020-08-23 NOTE — Telephone Encounter (Signed)
*  STAT* If patient is at the pharmacy, call can be transferred to refill team.   1. Which medications need to be refilled? (please list name of each medication and dose if known) acebutolol (SECTRAL) 200 MG capsule  2. Which pharmacy/location (including street and city if local pharmacy) is medication to be sent to? [facility] [place].  3. Do they need a 30 day or 90 day supply? [place]

## 2020-08-23 NOTE — Telephone Encounter (Signed)
Rx has been sent in. 

## 2020-09-11 ENCOUNTER — Telehealth: Payer: Self-pay

## 2020-09-11 ENCOUNTER — Ambulatory Visit
Admission: RE | Admit: 2020-09-11 | Discharge: 2020-09-11 | Disposition: A | Discharge status: Home / Self Care | Payer: Medicare Other | Source: Ambulatory Visit | Attending: Internal Medicine | Admitting: Internal Medicine

## 2020-09-11 ENCOUNTER — Telehealth: Payer: Self-pay | Admitting: Cardiology

## 2020-09-11 ENCOUNTER — Other Ambulatory Visit: Payer: Self-pay

## 2020-09-11 DIAGNOSIS — Z87891 Personal history of nicotine dependence: Secondary | ICD-10-CM

## 2020-09-11 DIAGNOSIS — I2510 Atherosclerotic heart disease of native coronary artery without angina pectoris: Secondary | ICD-10-CM | POA: Diagnosis not present

## 2020-09-11 DIAGNOSIS — I700 Atherosclerosis of aorta: Secondary | ICD-10-CM | POA: Diagnosis not present

## 2020-09-11 DIAGNOSIS — R911 Solitary pulmonary nodule: Secondary | ICD-10-CM | POA: Diagnosis not present

## 2020-09-11 NOTE — Telephone Encounter (Signed)
PA started on CMM for Acebutolol. Key Number: [id number]

## 2020-09-11 NOTE — Telephone Encounter (Signed)
New Message  [employer] is calling to let Carol Love know medicaiton acebutolol (SECTRAL) 200 MG capsule Has been approved

## 2020-12-11 ENCOUNTER — Other Ambulatory Visit: Payer: Self-pay | Admitting: Cardiology

## 2020-12-11 NOTE — Telephone Encounter (Signed)
Refill sent to pharmacy.   

## 2021-02-03 DIAGNOSIS — M62838 Other muscle spasm: Secondary | ICD-10-CM | POA: Diagnosis not present

## 2021-02-03 DIAGNOSIS — M5033 Other cervical disc degeneration, cervicothoracic region: Secondary | ICD-10-CM | POA: Diagnosis not present

## 2021-02-03 DIAGNOSIS — M542 Cervicalgia: Secondary | ICD-10-CM | POA: Diagnosis not present

## 2021-02-07 DIAGNOSIS — I10 Essential (primary) hypertension: Secondary | ICD-10-CM | POA: Diagnosis not present

## 2021-02-07 DIAGNOSIS — M47812 Spondylosis without myelopathy or radiculopathy, cervical region: Secondary | ICD-10-CM

## 2021-02-07 DIAGNOSIS — Z6825 Body mass index (BMI) 25.0-25.9, adult: Secondary | ICD-10-CM | POA: Diagnosis not present

## 2021-02-07 HISTORY — DX: Spondylosis without myelopathy or radiculopathy, cervical region: M47.812

## 2021-02-20 DIAGNOSIS — E785 Hyperlipidemia, unspecified: Secondary | ICD-10-CM | POA: Diagnosis not present

## 2021-02-20 DIAGNOSIS — I2510 Atherosclerotic heart disease of native coronary artery without angina pectoris: Secondary | ICD-10-CM | POA: Diagnosis not present

## 2021-02-20 DIAGNOSIS — I700 Atherosclerosis of aorta: Secondary | ICD-10-CM | POA: Diagnosis not present

## 2021-02-20 DIAGNOSIS — F411 Generalized anxiety disorder: Secondary | ICD-10-CM | POA: Diagnosis not present

## 2021-04-02 DIAGNOSIS — F419 Anxiety disorder, unspecified: Secondary | ICD-10-CM | POA: Diagnosis not present

## 2021-04-02 DIAGNOSIS — F330 Major depressive disorder, recurrent, mild: Secondary | ICD-10-CM | POA: Diagnosis not present

## 2021-04-18 DIAGNOSIS — F330 Major depressive disorder, recurrent, mild: Secondary | ICD-10-CM | POA: Diagnosis not present

## 2021-04-18 DIAGNOSIS — F419 Anxiety disorder, unspecified: Secondary | ICD-10-CM | POA: Diagnosis not present

## 2021-05-10 DIAGNOSIS — F330 Major depressive disorder, recurrent, mild: Secondary | ICD-10-CM | POA: Diagnosis not present

## 2021-05-10 DIAGNOSIS — F419 Anxiety disorder, unspecified: Secondary | ICD-10-CM | POA: Diagnosis not present

## 2021-05-29 DIAGNOSIS — F330 Major depressive disorder, recurrent, mild: Secondary | ICD-10-CM | POA: Diagnosis not present

## 2021-05-29 DIAGNOSIS — F419 Anxiety disorder, unspecified: Secondary | ICD-10-CM | POA: Diagnosis not present

## 2021-06-11 ENCOUNTER — Other Ambulatory Visit: Payer: Self-pay | Admitting: Cardiology

## 2021-06-11 DIAGNOSIS — Z23 Encounter for immunization: Secondary | ICD-10-CM | POA: Diagnosis not present

## 2021-06-21 DIAGNOSIS — F330 Major depressive disorder, recurrent, mild: Secondary | ICD-10-CM | POA: Diagnosis not present

## 2021-06-21 DIAGNOSIS — F419 Anxiety disorder, unspecified: Secondary | ICD-10-CM | POA: Diagnosis not present

## 2021-07-09 ENCOUNTER — Other Ambulatory Visit: Payer: Self-pay | Admitting: Cardiology

## 2021-07-17 DIAGNOSIS — F419 Anxiety disorder, unspecified: Secondary | ICD-10-CM | POA: Diagnosis not present

## 2021-07-17 DIAGNOSIS — F330 Major depressive disorder, recurrent, mild: Secondary | ICD-10-CM | POA: Diagnosis not present

## 2021-08-06 DIAGNOSIS — F330 Major depressive disorder, recurrent, mild: Secondary | ICD-10-CM | POA: Diagnosis not present

## 2021-08-06 DIAGNOSIS — F419 Anxiety disorder, unspecified: Secondary | ICD-10-CM | POA: Diagnosis not present

## 2021-08-13 ENCOUNTER — Other Ambulatory Visit: Payer: Self-pay

## 2021-08-13 DIAGNOSIS — L409 Psoriasis, unspecified: Secondary | ICD-10-CM | POA: Insufficient documentation

## 2021-08-13 DIAGNOSIS — I499 Cardiac arrhythmia, unspecified: Secondary | ICD-10-CM | POA: Insufficient documentation

## 2021-08-13 DIAGNOSIS — F419 Anxiety disorder, unspecified: Secondary | ICD-10-CM | POA: Insufficient documentation

## 2021-08-13 DIAGNOSIS — K5900 Constipation, unspecified: Secondary | ICD-10-CM | POA: Insufficient documentation

## 2021-08-13 DIAGNOSIS — M1990 Unspecified osteoarthritis, unspecified site: Secondary | ICD-10-CM | POA: Insufficient documentation

## 2021-08-13 DIAGNOSIS — R519 Headache, unspecified: Secondary | ICD-10-CM | POA: Insufficient documentation

## 2021-08-13 DIAGNOSIS — F32A Depression, unspecified: Secondary | ICD-10-CM | POA: Insufficient documentation

## 2021-08-15 ENCOUNTER — Other Ambulatory Visit: Payer: Self-pay

## 2021-08-15 ENCOUNTER — Ambulatory Visit: Payer: Medicare Other | Admitting: Cardiology

## 2021-08-15 ENCOUNTER — Encounter: Payer: Self-pay | Admitting: Cardiology

## 2021-08-15 VITALS — BP 140/74 | HR 75 | Ht 59.5 in | Wt 129.8 lb

## 2021-08-15 DIAGNOSIS — I2510 Atherosclerotic heart disease of native coronary artery without angina pectoris: Secondary | ICD-10-CM

## 2021-08-15 DIAGNOSIS — I10 Essential (primary) hypertension: Secondary | ICD-10-CM

## 2021-08-15 DIAGNOSIS — E782 Mixed hyperlipidemia: Secondary | ICD-10-CM

## 2021-08-15 DIAGNOSIS — Z0000 Encounter for general adult medical examination without abnormal findings: Secondary | ICD-10-CM

## 2021-08-15 HISTORY — DX: Encounter for general adult medical examination without abnormal findings: Z00.00

## 2021-08-15 HISTORY — DX: Atherosclerotic heart disease of native coronary artery without angina pectoris: I25.10

## 2021-08-15 NOTE — Progress Notes (Signed)
Cardiology Office Note:    Date:  08/15/2021   ID:  Carol Love, DOB 25-Jan-1951, MRN [id number]  PCP:  Michael Boston, MD  Cardiologist:  Jenean Lindau, MD   Referring MD: Michael Boston, MD    ASSESSMENT:    1. Essential (primary) hypertension   2. Mixed hyperlipidemia   3. Coronary artery disease involving native coronary artery of native heart without angina pectoris    PLAN:    In order of problems listed above:  Coronary artery disease: Secondary prevention stressed with the patient.  Importance of compliance with diet medication stressed and she vocalized understanding.  She is asymptomatic at this time.  I told her to exercise at least half an hour a day 5 days a week and she promises to do so. Essential hypertension: Blood pressure stable and diet was emphasized.  Lifestyle modification urged. Mixed dyslipidemia: Lipids were reviewed she is going back to her primary care in a week for blood work and will forward me the reports.  Diet and exercise stressed. Patient will be seen in follow-up appointment in 6 months or earlier if the patient has any concerns    Medication Adjustments/Labs and Tests Ordered: Current medicines are reviewed at length with the patient today.  Concerns regarding medicines are outlined above.  No orders of the defined types were placed in this encounter.  No orders of the defined types were placed in this encounter.    Chief Complaint  Patient presents with   Medication Refill    70yr follow up, no concerns     History of Present Illness:    Carol Love is a 70 y.o. female.  Patient has past medical history of coronary artery disease, essential hypertension and dyslipidemia.  She denies any problems at this time and takes care of activities of daily living.  No chest pain orthopnea or PND.  At the time of my evaluation, the patient is alert awake oriented and in no distress.  Patient mentions to me that she is under stressful  situation with her husband's health.  At the time of my evaluation, the patient is alert awake oriented and in no distress.  She has nitroglycerin with her but has not used it ever.  Past Medical History:  Diagnosis Date   Anxiety    Arthritis    Constipation    uses stool softener with stimulant   Coronary artery disease    Coronary artery disease involving native coronary artery of native heart without angina pectoris 03/01/2015   Overview:  2014 ST elevation MI  Formatting of this note might be different from the original. 2014 ST elevation MI   Depression    Essential (primary) hypertension 04/26/2010   Headache    last h/a  2001   Hyperlipidemia    Hypertension    Irregular heart beat    hx - no problems since taking acebutolol   Myocardial infarction ([place]) 2014   stent   Psoriasis    Spondylolisthesis 10/27/2014   SVD (spontaneous vaginal delivery)    x 3    Past Surgical History:  Procedure Laterality Date   APPENDECTOMY  05   CARDIAC CATHETERIZATION  2014   stent   CARDIOVASCULAR STRESS TEST  03/07/15   COLONOSCOPY     CORONARY ANGIOPLASTY     DES RCA 12/13/12   CORONARY STENT PLACEMENT     DILATION AND CURETTAGE OF UTERUS     LEFT HEART CATH AND CORONARY  ANGIOGRAPHY N/A 12/10/2018   Procedure: LEFT HEART CATH AND CORONARY ANGIOGRAPHY;  Surgeon: Belva Crome, MD;  Location: [place] [facility];  Service: Cardiovascular;  Laterality: N/A;   MAXIMUM ACCESS (MAS)POSTERIOR LUMBAR INTERBODY FUSION (PLIF) 1 LEVEL N/A 03/20/2015   Procedure: FOR MAXIMUM ACCESS (MAS) POSTERIOR LUMBAR INTERBODY FUSION (PLIF) 1 LEVEL;  Surgeon: Earnie Larsson, MD;  Location: [place] [facility];  Service: Neurosurgery;  Laterality: N/A;  FOR MAXIMUM ACCESS (MAS) POSTERIOR LUMBAR INTERBODY FUSION (PLIF) 1 LEVEL LUMBAR FOUR-FIVE   NASAL SEPTUM SURGERY  1985   s/p fall   SHOULDER ARTHROSCOPY Left 08   TONSILLECTOMY     TOOTH EXTRACTION     4 teeth pulled prior to braces as a child   WISDOM TOOTH EXTRACTION       Current Medications: No outpatient medications have been marked as taking for the 08/15/21 encounter (Office Visit) with Rindy Kollman, Reita Cliche, MD.     Allergies:   Epinephrine, Ibuprofen, Penicillins, and Sulfa antibiotics   Social History   Socioeconomic History   Marital status: Married    Spouse name: Not on file   Number of children: 3   Years of education: Not on file   Highest education level: Not on file  Occupational History   Not on file  Tobacco Use   Smoking status: Former    Packs/day: 0.50    Years: 45.00    Pack years: 22.50    Types: Cigarettes    Quit date: 10/09/2012    Years since quitting: 8.8   Smokeless tobacco: Never  Vaping Use   Vaping Use: Never used  Substance and Sexual Activity   Alcohol use: Yes    Alcohol/week: 4.0 - 6.0 standard drinks    Types: 4 - 6 Glasses of wine per week    Comment: several times a week- 1-2 glasses of wine    Drug use: No   Sexual activity: Not on file  Other Topics Concern   Not on file  Social History Narrative   Not on file   Social Determinants of Health   Financial Resource Strain: Not on file  Food Insecurity: Not on file  Transportation Needs: Not on file  Physical Activity: Not on file  Stress: Not on file  Social Connections: Not on file     Family History: The patient's family history includes AAA (abdominal aortic aneurysm) in her mother; Heart disease in her father and mother; Hyperlipidemia in her mother; Lung cancer in her mother. There is no history of Colon cancer, Colon polyps, Esophageal cancer, Rectal cancer, or Stomach cancer.  ROS:   Please see the history of present illness.    All other systems reviewed and are negative.  EKGs/Labs/Other Studies Reviewed:    The following studies were reviewed today: I discussed my findings with the patient at length.   Recent Labs: No results found for requested labs within last 8760 hours.  Recent Lipid Panel    Component Value Date/Time    CHOL 163 11/30/2018 1231   TRIG 250 (H) 11/30/2018 1231   HDL 49 11/30/2018 1231   CHOLHDL 3.3 11/30/2018 1231   LDLCALC 64 11/30/2018 1231    Physical Exam:    VS:  BP 140/74 (BP Location: Right Arm, Patient Position: Sitting)   Pulse 75   Ht 4' 11.5" (1.511 m)   Wt 129 lb 12.8 oz (58.9 kg)   LMP  (LMP Unknown)   SpO2 95%   BMI 25.78 kg/m  Wt Readings from Last 3 Encounters:  08/15/21 129 lb 12.8 oz (58.9 kg)  12/28/19 133 lb (60.3 kg)  07/19/19 136 lb (61.7 kg)     GEN: Patient is in no acute distress HEENT: Normal NECK: No JVD; No carotid bruits LYMPHATICS: No lymphadenopathy CARDIAC: Hear sounds regular, 2/6 systolic murmur at the apex. RESPIRATORY:  Clear to auscultation without rales, wheezing or rhonchi  ABDOMEN: Soft, non-tender, non-distended MUSCULOSKELETAL:  No edema; No deformity  SKIN: Warm and dry NEUROLOGIC:  Alert and oriented x 3 PSYCHIATRIC:  Normal affect   Signed, Jenean Lindau, MD  08/15/2021 11:01 AM    [place]

## 2021-08-15 NOTE — Patient Instructions (Signed)
Medication Instructions:  Your physician recommends that you continue on your current medications as directed. Please refer to the Current Medication list given to you today.  *If you need a refill on your cardiac medications before your next appointment, please call your pharmacy*   Lab Work: None ordered If you have labs (blood work) drawn today and your tests are completely normal, you will receive your results only by: [place] (if you have MyChart) OR A paper copy in the mail If you have any lab test that is abnormal or we need to change your treatment, we will call you to review the results.   Testing/Procedures: None ordered   Follow-Up: At [facility], you and your health needs are our priority.  As part of our continuing mission to provide you with exceptional heart care, we have created designated Provider Care Teams.  These Care Teams include your primary Cardiologist (physician) and Advanced Practice Providers (APPs -  Physician Assistants and Nurse Practitioners) who all work together to provide you with the care you need, when you need it.  We recommend signing up for the patient portal called "MyChart".  Sign up information is provided on this After Visit Summary.  MyChart is used to connect with patients for Virtual Visits (Telemedicine).  Patients are able to view lab/test results, encounter notes, upcoming appointments, etc.  Non-urgent messages can be sent to your provider as well.   To learn more about what you can do with MyChart, go to [email or web address].    Your next appointment:   8 month(s)  The format for your next appointment:   In Person  Provider:   Jyl Heinz, MD   Other Instructions NA

## 2021-08-23 DIAGNOSIS — M859 Disorder of bone density and structure, unspecified: Secondary | ICD-10-CM | POA: Diagnosis not present

## 2021-08-23 DIAGNOSIS — R739 Hyperglycemia, unspecified: Secondary | ICD-10-CM | POA: Diagnosis not present

## 2021-08-23 DIAGNOSIS — I2510 Atherosclerotic heart disease of native coronary artery without angina pectoris: Secondary | ICD-10-CM | POA: Diagnosis not present

## 2021-08-23 DIAGNOSIS — E785 Hyperlipidemia, unspecified: Secondary | ICD-10-CM | POA: Diagnosis not present

## 2021-08-24 ENCOUNTER — Other Ambulatory Visit: Payer: Self-pay | Admitting: Cardiology

## 2021-09-03 ENCOUNTER — Other Ambulatory Visit: Payer: Self-pay | Admitting: Internal Medicine

## 2021-09-03 DIAGNOSIS — Z87891 Personal history of nicotine dependence: Secondary | ICD-10-CM

## 2021-09-04 DIAGNOSIS — F419 Anxiety disorder, unspecified: Secondary | ICD-10-CM | POA: Diagnosis not present

## 2021-09-04 DIAGNOSIS — F330 Major depressive disorder, recurrent, mild: Secondary | ICD-10-CM | POA: Diagnosis not present

## 2021-09-11 DIAGNOSIS — Z1231 Encounter for screening mammogram for malignant neoplasm of breast: Secondary | ICD-10-CM | POA: Diagnosis not present

## 2021-09-25 DIAGNOSIS — Z20822 Contact with and (suspected) exposure to covid-19: Secondary | ICD-10-CM | POA: Diagnosis not present

## 2021-09-25 DIAGNOSIS — I10 Essential (primary) hypertension: Secondary | ICD-10-CM | POA: Diagnosis not present

## 2021-09-25 DIAGNOSIS — R030 Elevated blood-pressure reading, without diagnosis of hypertension: Secondary | ICD-10-CM | POA: Diagnosis not present

## 2021-09-25 DIAGNOSIS — R519 Headache, unspecified: Secondary | ICD-10-CM | POA: Diagnosis not present

## 2021-09-26 ENCOUNTER — Other Ambulatory Visit: Payer: Self-pay

## 2021-09-26 ENCOUNTER — Telehealth: Payer: Self-pay | Admitting: Cardiology

## 2021-09-26 ENCOUNTER — Ambulatory Visit: Payer: Medicare Other | Admitting: Cardiology

## 2021-09-26 VITALS — BP 156/94 | HR 82 | Ht 59.6 in | Wt 127.2 lb

## 2021-09-26 DIAGNOSIS — I2510 Atherosclerotic heart disease of native coronary artery without angina pectoris: Secondary | ICD-10-CM

## 2021-09-26 DIAGNOSIS — I10 Essential (primary) hypertension: Secondary | ICD-10-CM | POA: Diagnosis not present

## 2021-09-26 DIAGNOSIS — R519 Headache, unspecified: Secondary | ICD-10-CM | POA: Diagnosis not present

## 2021-09-26 DIAGNOSIS — E782 Mixed hyperlipidemia: Secondary | ICD-10-CM | POA: Diagnosis not present

## 2021-09-26 DIAGNOSIS — F339 Major depressive disorder, recurrent, unspecified: Secondary | ICD-10-CM | POA: Insufficient documentation

## 2021-09-26 HISTORY — DX: Major depressive disorder, recurrent, unspecified: F33.9

## 2021-09-26 MED ORDER — AMLODIPINE BESYLATE 5 MG PO TABS: 5.0000 mg | ORAL_TABLET | Freq: Every day | ORAL | 1 refills | Status: DC

## 2021-09-26 NOTE — Progress Notes (Signed)
Cardiology Office Note:    Date:  09/26/2021   ID:  [name], DOB [birth year], MRN [id number]  PCP:  Michael Boston, MD  Cardiologist:  Jenean Lindau, MD   Referring MD: Michael Boston, MD    ASSESSMENT:    1. Coronary artery disease involving native coronary artery of native heart without angina pectoris   2. Essential (primary) hypertension   3. Mixed hyperlipidemia    PLAN:    In order of problems listed above:  Elevated blood pressure and headache: Her blood pressure today is much better.  However she complains of headache and I told her that she needs to go to the emergency room ASAP to be evaluated.  She has not wanting to do it and her husband is with her.  So we will arrange for a stat CT scan of her head to see if there is any etiology of her headache.  I told her also to get in touch with her primary care physician about her headache issues if the CT scan is negative.  She also has some mild nausea at times and I also told her to get in touch with primary care for this.  I will initiate her on amlodipine 5 mg daily.  Diet was emphasized.  I told her that if she has any continuing concerns she must go to the nearest emergency that would be the prudent thing to do. Mixed dyslipidemia: Diet was emphasized.  Lipids were reviewed and questions were answered to her satisfaction. She will keep a track of her blood pressures in the next few days and send me a copy of it for follow-up of blood pressures. Patient will be seen in follow-up appointment in 6 months or earlier if the patient has any concerns.  I will see if she needs an earlier appointment based on the results of the aforementioned test.  Again she was advised to get in touch with her primary care doctor and keep them posted about these findings and symptoms.   Medication Adjustments/Labs and Tests Ordered: Current medicines are reviewed at length with the patient today.  Concerns regarding medicines are outlined  above.  No orders of the defined types were placed in this encounter.  No orders of the defined types were placed in this encounter.    No chief complaint on file.    History of Present Illness:    Carol Love is a 71 y.o. female with past medical history of coronary artery disease, essential hypertension and mixed dyslipidemia.  She mentions to me that since yesterday she has elevated blood pressure.  It was 180 yesterday.  She appears to be very anxious.  She went to the emergency room waited outside and left because of the long wait.  She complains of a headache since.  No orthopnea or PND.  She has had some mild headache.  At the time of my evaluation, the patient is alert awake oriented and in no distress.  No chest pain.  She appears anxious but is comfortable.  She denies any symptoms other than the headache.  Past Medical History:  Diagnosis Date   Abnormal nuclear stress test 12/08/2018   Anxiety    Arthritis    CAD (coronary artery disease) 08/15/2021   Cervical spondylosis 02/07/2021   Constipation    uses stool softener with stimulant   Coronary artery disease involving native coronary artery of native heart without angina pectoris 03/01/2015   Overview:  2014 ST  elevation MI  Formatting of this note might be different from the original. 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 2014 ST elevation MI   Headache    last h/a  2001   Hyperglycemia 01/06/2020   Hyperlipidemia    Irregular heart beat    hx - no problems since taking  acebutolol   Myocardial infarction [facility]) 2014   stent   Nicotine dependence 01/06/2020   Overweight 01/06/2020   Psoriasis    Recurrent major depression ([place]) 09/26/2021   Spinal stenosis of lumbar region 05/10/2010   Spondylolisthesis 10/27/2014   SVD (spontaneous vaginal delivery)    x 3    Past Surgical History:  Procedure Laterality Date   APPENDECTOMY  05   CARDIAC CATHETERIZATION  2014   stent   CARDIOVASCULAR STRESS TEST  03/07/15   COLONOSCOPY     CORONARY ANGIOPLASTY     DES RCA 12/13/12   CORONARY STENT PLACEMENT     DILATION AND CURETTAGE OF UTERUS     LEFT HEART CATH AND CORONARY ANGIOGRAPHY N/A 12/10/2018   Procedure: LEFT HEART CATH AND CORONARY ANGIOGRAPHY;  Surgeon: Belva Crome, MD;  Location: [place] [facility];  Service: Cardiovascular;  Laterality: N/A;   MAXIMUM ACCESS (MAS)POSTERIOR LUMBAR INTERBODY FUSION (PLIF) 1 LEVEL N/A 03/20/2015   Procedure: FOR MAXIMUM ACCESS (MAS) POSTERIOR LUMBAR INTERBODY FUSION (PLIF) 1 LEVEL;  Surgeon: Earnie Larsson, MD;  Location: [place] [facility];  Service: Neurosurgery;  Laterality: N/A;  FOR MAXIMUM ACCESS (MAS) POSTERIOR LUMBAR INTERBODY FUSION (PLIF) 1 LEVEL LUMBAR FOUR-FIVE   NASAL SEPTUM SURGERY  1985   s/p fall   SHOULDER ARTHROSCOPY Left 08   TONSILLECTOMY     TOOTH EXTRACTION     4 teeth pulled prior to braces as a child   WISDOM TOOTH EXTRACTION      Current Medications: Current Meds  Medication Sig   acebutolol (SECTRAL) 200 MG capsule Take 1 capsule (200 mg total) by mouth daily.   ALPRAZolam (XANAX) 0.5 MG tablet Take 0.5 mg by mouth in the morning. And take 1 mg in the evening   amitriptyline (ELAVIL) 50 MG tablet Take 50 mg by mouth at bedtime.   aspirin EC 81 MG tablet Take 81 mg by mouth every evening.   atorvastatin (LIPITOR) 40 MG tablet TAKE 1 TABLET BY MOUTH DAILY IN THE EVENING   busPIRone (BUSPAR) 15 MG tablet Take 15 mg by mouth 2 (two) times daily.   Calcium Citrate-Vitamin D (CALCIUM + D PO) Take 1 tablet  by mouth daily.   DULoxetine (CYMBALTA) 60 MG capsule Take 60 mg by mouth daily.   lactase (LACTAID) 3000 units tablet Take 6,000 Units by mouth daily.   meloxicam (MOBIC) 15 MG tablet Take 15 mg by mouth daily.   Multiple Vitamin (MULTIVITAMIN) capsule Take 1 capsule by mouth daily.   nitroGLYCERIN (NITROSTAT) 0.4 MG SL tablet Place 0.4 mg under the tongue every 5 (five) minutes as needed for chest pain.   Polyethylene Glycol 3350 (MIRALAX PO) Take 1 Scoop by mouth daily as  needed for constipation.   traMADol (ULTRAM) 50 MG tablet Take 50 mg by mouth every 6 (six) hours as needed for pain.   triamcinolone cream (KENALOG) 0.1 % Apply 1 application topically daily as needed for rash (contact dermatitis).     Allergies:   Epinephrine, Ibuprofen, Penicillins, and Sulfa antibiotics   Social History   Socioeconomic History   Marital status: Married    Spouse name: Not on file   Number of children: 3   Years of education: Not on file   Highest education level: Not on file  Occupational History   Not on file  Tobacco Use   Smoking status: Former    Packs/day: 0.50    Years: 45.00    Pack years: 22.50    Types: Cigarettes    Quit date: 10/09/2012    Years since quitting: 8.9   Smokeless tobacco: Never  Vaping Use   Vaping Use: Never used  Substance and Sexual Activity   Alcohol use: Yes    Alcohol/week: 4.0 - 6.0 standard drinks    Types: 4 - 6 Glasses of wine per week    Comment: several times a week- 1-2 glasses of wine    Drug use: No   Sexual activity: Not on file  Other Topics Concern   Not on file  Social History Narrative   Not on file   Social Determinants of Health   Financial Resource Strain: Not on file  Food Insecurity: Not on file  Transportation Needs: Not on file  Physical Activity: Not on file  Stress: Not on file  Social Connections: Not on file     Family History: The patient's family history includes AAA (abdominal aortic aneurysm) in her mother;  Heart disease in her father and mother; Hyperlipidemia in her mother; Lung cancer in her mother. There is no history of Colon cancer, Colon polyps, Esophageal cancer, Rectal cancer, or Stomach cancer.  ROS:   Please see the history of present illness.    All other systems reviewed and are negative.  EKGs/Labs/Other Studies Reviewed:    The following studies were reviewed today: I reviewed EKGs from the EMS people there were unremarkable   Recent Labs: No results found for requested labs within last 8760 hours.  Recent Lipid Panel    Component Value Date/Time   CHOL 163 11/30/2018 1231   TRIG 250 (H) 11/30/2018 1231   HDL 49 11/30/2018 1231   CHOLHDL 3.3 11/30/2018 1231   LDLCALC 64 11/30/2018 1231    Physical Exam:    VS:  BP (!) 156/94    Pulse 82    Ht 4' 11.6" (1.514 m)    Wt 127 lb 3.2 oz (57.7 kg)    LMP  (LMP Unknown)    SpO2 97%    BMI 25.18 kg/m     Wt Readings from Last 3 Encounters:  09/26/21 127 lb 3.2 oz (57.7 kg)  08/15/21 129 lb 12.8 oz (58.9 kg)  12/28/19 133 lb (60.3 kg)     GEN: Patient is in no acute distress HEENT: Normal NECK: No JVD; No carotid bruits LYMPHATICS: No lymphadenopathy CARDIAC: Hear sounds regular, 2/6 systolic murmur at the apex. RESPIRATORY:  Clear to auscultation without rales, wheezing or rhonchi  ABDOMEN: Soft, non-tender, non-distended MUSCULOSKELETAL:  No edema; No deformity  SKIN: Warm and dry NEUROLOGIC:  Alert and oriented x 3 PSYCHIATRIC:  Normal affect   Signed, Jenean Lindau, MD  09/26/2021 1:56 PM    [facility]

## 2021-09-26 NOTE — Addendum Note (Signed)
Addended by: Senaida Ores on: 09/26/2021 02:27 PM   Modules accepted: Orders

## 2021-09-26 NOTE — Telephone Encounter (Signed)
Called and spoke with pt.  Upon answering call pt states she already has EMS en route.  She has taken 4 baby aspirin per recc by EMS dispatch.  Pt's husband is present with her.  Pt will be on her way to ER via EMS.  Notified pt I will make Dr. Geraldo Pitter aware.

## 2021-09-26 NOTE — Telephone Encounter (Signed)
° °  Pt c/o BP issue: STAT if pt c/o blurred vision, one-sided weakness or slurred speech  1. What are your last 5 BP readings? 181/151  2. Are you having any other symptoms (ex. Dizziness, headache, blurred vision, passed out)?   3. What is your BP issue? Pt said she is having High BP, she went to ED last night and wait time was over 6 hours and she left without being seen, she would like to see Dr. Geraldo Pitter

## 2021-09-26 NOTE — Patient Instructions (Signed)
Medication Instructions:  Your physician has recommended you make the following change in your medication:  START: Amlodipine 5 mg daily   *If you need a refill on your cardiac medications before your next appointment, please call your pharmacy*   Lab Work: None If you have labs (blood work) drawn today and your tests are completely normal, you will receive your results only by: [place] (if you have MyChart) OR A paper copy in the mail If you have any lab test that is abnormal or we need to change your treatment, we will call you to review the results.   Testing/Procedures: Non-Cardiac CT scanning, (CAT scanning), is a noninvasive, special x-ray that produces cross-sectional images of the body using x-rays and a computer. CT scans help physicians diagnose and treat medical conditions. For some CT exams, a contrast material is used to enhance visibility in the area of the body being studied. CT scans provide greater clarity and reveal more details than regular x-ray exams.    Follow-Up: At [facility], you and your health needs are our priority.  As part of our continuing mission to provide you with exceptional heart care, we have created designated Provider Care Teams.  These Care Teams include your primary Cardiologist (physician) and Advanced Practice Providers (APPs -  Physician Assistants and Nurse Practitioners) who all work together to provide you with the care you need, when you need it.  We recommend signing up for the patient portal called "MyChart".  Sign up information is provided on this After Visit Summary.  MyChart is used to connect with patients for Virtual Visits (Telemedicine).  Patients are able to view lab/test results, encounter notes, upcoming appointments, etc.  Non-urgent messages can be sent to your provider as well.   To learn more about what you can do with MyChart, go to [email or web address].    Your next appointment:   6 month(s)  The format  for your next appointment:   In Person  Provider:   Jyl Heinz, MD    Other Instructions  Amlodipine Tablets What is this medication? AMLODIPINE (am LOE di peen) treats high blood pressure and prevents chest pain (angina). It works by relaxing the blood vessels, which helps decrease the amount of work your heart has to do. It belongs to a group of medications called calcium channel blockers. This medicine may be used for other purposes; ask your health care provider or pharmacist if you have questions. COMMON BRAND NAME(S): Norvasc What should I tell my care team before I take this medication? They need to know if you have any of these conditions: Heart disease Liver disease An unusual or allergic reaction to amlodipine, other medications, foods, dyes, or preservatives Pregnant or trying to get pregnant Breast-feeding How should I use this medication? Take this medication by mouth. Take it as directed on the prescription label at the same time every day. You can take it with or without food. If it upsets your stomach, take it with food. Keep taking it unless your care team tells you to stop. Talk to your care team about the use of this medication in children. While it may be prescribed for children as young as 6 for selected conditions, precautions do apply. Overdosage: If you think you have taken too much of this medicine contact a poison control center or emergency room at once. NOTE: This medicine is only for you. Do not share this medicine with others. What if I miss a dose? If you miss a  dose, take it as soon as you can. If it is almost time for your next dose, take only that dose. Do not take double or extra doses. What may interact with this medication? Clarithromycin Cyclosporine Diltiazem Itraconazole Simvastatin Tacrolimus This list may not describe all possible interactions. Give your health care provider a list of all the medicines, herbs, non-prescription drugs, or  dietary supplements you use. Also tell them if you smoke, drink alcohol, or use illegal drugs. Some items may interact with your medicine. What should I watch for while using this medication? Visit your health care provider for regular checks on your progress. Check your blood pressure as directed. Ask your health care provider what your blood pressure should be. Also, find out when you should contact him or her. Do not treat yourself for coughs, colds, or pain while you are using this medication without asking your health care provider for advice. Some medications may increase your blood pressure. You may get drowsy or dizzy. Do not drive, use machinery, or do anything that needs mental alertness until you know how this medication affects you. Do not stand up or sit up quickly, especially if you are an older patient. This reduces the risk of dizzy or fainting spells. Alcohol can make you more drowsy and dizzy. Avoid alcoholic drinks. What side effects may I notice from receiving this medication? Side effects that you should report to your care team as soon as possible: Allergic reactions--skin rash, itching, hives, swelling of the face, lips, tongue, or throat Heart attack--pain or tightness in the chest, shoulders, arms, or jaw, nausea, shortness of breath, cold or clammy skin, feeling faint or lightheaded Low blood pressure--dizziness, feeling faint or lightheaded, blurry vision Side effects that usually do not require medical attention (report these to your care team if they continue or are bothersome): Facial flushing, redness Heart palpitations--rapid, pounding, or irregular heartbeat Nausea Stomach pain Swelling of the ankles, hands, or feet This list may not describe all possible side effects. Call your doctor for medical advice about side effects. You may report side effects to FDA at 1-800-FDA-1088. Where should I keep my medication? Keep out of the reach of children and pets. Store at  room temperature between 20 and 25 degrees C (68 and 77 degrees F). Protect from light and moisture. Keep the container tightly closed. Get rid of any unused medication after the expiration date. To get rid of medications that are no longer needed or have expired: Take the medication to a medication take-back program. Check with your pharmacy or law enforcement to find a location. If you cannot return the medication, check the label or package insert to see if the medication should be thrown out in the garbage or flushed down the toilet. If you are not sure, ask your health care provider. If it is safe to put in the trash, empty the medication out of the container. Mix the medication with cat litter, dirt, coffee grounds, or other unwanted substance. Seal the mixture in a bag or container. Put it in the trash. NOTE: This sheet is a summary. It may not cover all possible information. If you have questions about this medicine, talk to your doctor, pharmacist, or health care provider.  2022 Elsevier/Gold Standard (2021-05-29 00:00:00)

## 2021-09-28 ENCOUNTER — Ambulatory Visit
Admission: RE | Admit: 2021-09-28 | Discharge: 2021-09-28 | Disposition: A | Discharge status: Home / Self Care | Payer: Medicare Other | Source: Ambulatory Visit | Attending: Internal Medicine | Admitting: Internal Medicine

## 2021-09-28 ENCOUNTER — Other Ambulatory Visit: Payer: Self-pay

## 2021-09-28 DIAGNOSIS — R918 Other nonspecific abnormal finding of lung field: Secondary | ICD-10-CM | POA: Diagnosis not present

## 2021-09-28 DIAGNOSIS — I700 Atherosclerosis of aorta: Secondary | ICD-10-CM | POA: Diagnosis not present

## 2021-09-28 DIAGNOSIS — I2510 Atherosclerotic heart disease of native coronary artery without angina pectoris: Secondary | ICD-10-CM | POA: Diagnosis not present

## 2021-09-28 DIAGNOSIS — Z87891 Personal history of nicotine dependence: Secondary | ICD-10-CM | POA: Diagnosis not present

## 2021-10-02 ENCOUNTER — Other Ambulatory Visit: Payer: Self-pay | Admitting: Cardiology

## 2021-10-24 DIAGNOSIS — F419 Anxiety disorder, unspecified: Secondary | ICD-10-CM | POA: Diagnosis not present

## 2021-10-24 DIAGNOSIS — F330 Major depressive disorder, recurrent, mild: Secondary | ICD-10-CM | POA: Diagnosis not present

## 2021-11-05 ENCOUNTER — Other Ambulatory Visit: Payer: Self-pay | Admitting: Cardiology

## 2021-11-12 DIAGNOSIS — S71101A Unspecified open wound, right thigh, initial encounter: Secondary | ICD-10-CM | POA: Diagnosis not present

## 2021-11-12 DIAGNOSIS — T24111A Burn of first degree of right thigh, initial encounter: Secondary | ICD-10-CM | POA: Diagnosis not present

## 2021-12-10 ENCOUNTER — Other Ambulatory Visit: Payer: Self-pay | Admitting: Cardiology

## 2022-03-19 ENCOUNTER — Other Ambulatory Visit: Payer: Self-pay | Admitting: Cardiology

## 2022-04-12 ENCOUNTER — Ambulatory Visit: Payer: Medicare Other | Admitting: Cardiology

## 2022-04-12 ENCOUNTER — Encounter: Payer: Self-pay | Admitting: Cardiology

## 2022-04-12 VITALS — BP 126/76 | HR 82 | Ht 59.6 in | Wt 128.8 lb

## 2022-04-12 DIAGNOSIS — I10 Essential (primary) hypertension: Secondary | ICD-10-CM

## 2022-04-12 DIAGNOSIS — E782 Mixed hyperlipidemia: Secondary | ICD-10-CM

## 2022-04-12 DIAGNOSIS — I2510 Atherosclerotic heart disease of native coronary artery without angina pectoris: Secondary | ICD-10-CM

## 2022-04-12 NOTE — Progress Notes (Signed)
Cardiology Office Note:    Date:  04/12/2022   ID:  Carol Love, DOB 05/04/1951, MRN [id number]  PCP:  Michael Boston, MD  Cardiologist:  Jenean Lindau, MD   Referring MD: Michael Boston, MD    ASSESSMENT:    1. Essential (primary) hypertension   2. Mixed hyperlipidemia   3. Coronary artery disease involving native coronary artery of native heart without angina pectoris    PLAN:    In order of problems listed above:  Coronary artery disease: Secondary prevention stressed with the patient.  Importance of compliance with diet medication stressed and she vocalized understanding.  She was advised to walk at least half an hour a day 5 days a week and she promises to do so. Essential hypertension: Blood pressure stable and diet was emphasized.  Lifestyle modification urged.  Salt intake issues were discussed. Mixed dyslipidemia: On lipid-lowering medications and followed by primary care she has been cognizant of diet and takes good care of herself. Patient will be seen in follow-up appointment in 9 months or earlier if the patient has any concerns    Medication Adjustments/Labs and Tests Ordered: Current medicines are reviewed at length with the patient today.  Concerns regarding medicines are outlined above.  No orders of the defined types were placed in this encounter.  No orders of the defined types were placed in this encounter.    No chief complaint on file.    History of Present Illness:    Carol Love is a 71 y.o. female.  Patient has past medical history of coronary artery disease, essential hypertension and dyslipidemia.  She denies any problems at this time and takes care of activities of daily living.  No chest pain orthopnea she ambulates on a regular basis.  Her blood pressures under good control and she checks it regularly at home.  Past Medical History:  Diagnosis Date   Abnormal nuclear stress test 12/08/2018   Anxiety    Arthritis    CAD (coronary  artery disease) 08/15/2021   Cervical spondylosis 02/07/2021   Constipation    uses stool softener with stimulant   Coronary artery disease involving native coronary artery of native heart without angina pectoris 03/01/2015   Overview:  2014 ST elevation MI  Formatting of this note might be different from the original. 2014 ST elevation MI   Degenerative spondylolisthesis 03/20/2015   Depression    Disorder of bone 10/27/2014   Disorder of sacroiliac joint 12/19/2015   Encounter for general adult medical examination without abnormal findings 08/15/2021   Essential (primary) hypertension 04/26/2010   Generalized anxiety disorder 01/06/2020   Hardening of the aorta (main artery of the heart) ([place]) 03/01/2015   Overview:  2014 ST elevation MI  Formatting of this note might be different from the original. 2014 ST elevation MI   Headache    last h/a  2001   Hyperglycemia 01/06/2020   Hyperlipidemia    Irregular heart beat    hx - no problems since taking acebutolol   Myocardial infarction [facility]) 2014   stent   Nicotine dependence 01/06/2020   Overweight 01/06/2020   Psoriasis    Recurrent major depression ([place]) 09/26/2021   Spinal stenosis of lumbar region 05/10/2010   Spondylolisthesis 10/27/2014   SVD (spontaneous vaginal delivery)    x 3    Past Surgical History:  Procedure Laterality Date   APPENDECTOMY  05   CARDIAC CATHETERIZATION  2014   stent   CARDIOVASCULAR  STRESS TEST  03/07/15   COLONOSCOPY     CORONARY ANGIOPLASTY     DES RCA 12/13/12   CORONARY STENT PLACEMENT     DILATION AND CURETTAGE OF UTERUS     LEFT HEART CATH AND CORONARY ANGIOGRAPHY N/A 12/10/2018   Procedure: LEFT HEART CATH AND CORONARY ANGIOGRAPHY;  Surgeon: Belva Crome, MD;  Location: [place] [facility];  Service: Cardiovascular;  Laterality: N/A;   MAXIMUM ACCESS (MAS)POSTERIOR LUMBAR INTERBODY FUSION (PLIF) 1 LEVEL N/A 03/20/2015   Procedure: FOR MAXIMUM ACCESS (MAS) POSTERIOR LUMBAR INTERBODY FUSION (PLIF) 1  LEVEL;  Surgeon: Earnie Larsson, MD;  Location: [place] [facility];  Service: Neurosurgery;  Laterality: N/A;  FOR MAXIMUM ACCESS (MAS) POSTERIOR LUMBAR INTERBODY FUSION (PLIF) 1 LEVEL LUMBAR FOUR-FIVE   NASAL SEPTUM SURGERY  1985   s/p fall   SHOULDER ARTHROSCOPY Left 08   TONSILLECTOMY     TOOTH EXTRACTION     4 teeth pulled prior to braces as a child   WISDOM TOOTH EXTRACTION      Current Medications: Current Meds  Medication Sig   acebutolol (SECTRAL) 200 MG capsule Take 1 capsule (200 mg total) by mouth daily.   ALPRAZolam (XANAX) 0.5 MG tablet Take 0.5 mg by mouth in the morning. And take 1 mg in the evening   amitriptyline (ELAVIL) 50 MG tablet Take 50 mg by mouth at bedtime.   amLODipine (NORVASC) 5 MG tablet TAKE 1 TABLET BY MOUTH DAILY   aspirin EC 81 MG tablet Take 81 mg by mouth every evening.   atorvastatin (LIPITOR) 40 MG tablet Take 1 tablet (40 mg total) by mouth every evening.   busPIRone (BUSPAR) 15 MG tablet Take 15 mg by mouth 2 (two) times daily.   Calcium Citrate-Vitamin D (CALCIUM + D PO) Take 1 tablet by mouth daily.   DULoxetine (CYMBALTA) 60 MG capsule Take 60 mg by mouth daily.   lactase (LACTAID) 3000 units tablet Take 6,000 Units by mouth daily.   meloxicam (MOBIC) 15 MG tablet Take 15 mg by mouth daily.   Multiple Vitamin (MULTIVITAMIN) capsule Take 1 capsule by mouth daily.   nitroGLYCERIN (NITROSTAT) 0.4 MG SL tablet Place 0.4 mg under the tongue every 5 (five) minutes as needed for chest pain.   Polyethylene Glycol 3350 (MIRALAX PO) Take 1 Scoop by mouth daily as needed for constipation.   traMADol (ULTRAM) 50 MG tablet Take 50 mg by mouth every 6 (six) hours as needed for pain.   triamcinolone cream (KENALOG) 0.1 % Apply 1 application topically daily as needed for rash (contact dermatitis).     Allergies:   Epinephrine, Ibuprofen, Penicillins, and Sulfa antibiotics   Social History   Socioeconomic History   Marital status: Married    Spouse name: Not on  file   Number of children: 3   Years of education: Not on file   Highest education level: Not on file  Occupational History   Not on file  Tobacco Use   Smoking status: Former    Packs/day: 0.50    Years: 45.00    Total pack years: 22.50    Types: Cigarettes    Quit date: 10/09/2012    Years since quitting: 9.5   Smokeless tobacco: Never  Vaping Use   Vaping Use: Never used  Substance and Sexual Activity   Alcohol use: Yes    Alcohol/week: 4.0 - 6.0 standard drinks of alcohol    Types: 4 - 6 Glasses of wine per week  Comment: several times a week- 1-2 glasses of wine    Drug use: No   Sexual activity: Not on file  Other Topics Concern   Not on file  Social History Narrative   Not on file   Social Determinants of Health   Financial Resource Strain: Not on file  Food Insecurity: Not on file  Transportation Needs: Not on file  Physical Activity: Not on file  Stress: Not on file  Social Connections: Not on file     Family History: The patient's family history includes AAA (abdominal aortic aneurysm) in her mother; Heart disease in her father and mother; Hyperlipidemia in her mother; Lung cancer in her mother. There is no history of Colon cancer, Colon polyps, Esophageal cancer, Rectal cancer, or Stomach cancer.  ROS:   Please see the history of present illness.    All other systems reviewed and are negative.  EKGs/Labs/Other Studies Reviewed:    The following studies were reviewed today: I discussed my findings with the patient at length   Recent Labs: No results found for requested labs within last 365 days.  Recent Lipid Panel    Component Value Date/Time   CHOL 163 11/30/2018 1231   TRIG 250 (H) 11/30/2018 1231   HDL 49 11/30/2018 1231   CHOLHDL 3.3 11/30/2018 1231   LDLCALC 64 11/30/2018 1231    Physical Exam:    VS:  BP 126/76   Pulse 82   Ht 4' 11.6" (1.514 m)   Wt 128 lb 12.8 oz (58.4 kg)   LMP  (LMP Unknown)   SpO2 98%   BMI 25.49 kg/m      Wt Readings from Last 3 Encounters:  04/12/22 128 lb 12.8 oz (58.4 kg)  09/26/21 127 lb 3.2 oz (57.7 kg)  08/15/21 129 lb 12.8 oz (58.9 kg)     GEN: Patient is in no acute distress HEENT: Normal NECK: No JVD; No carotid bruits LYMPHATICS: No lymphadenopathy CARDIAC: Hear sounds regular, 2/6 systolic murmur at the apex. RESPIRATORY:  Clear to auscultation without rales, wheezing or rhonchi  ABDOMEN: Soft, non-tender, non-distended MUSCULOSKELETAL:  No edema; No deformity  SKIN: Warm and dry NEUROLOGIC:  Alert and oriented x 3 PSYCHIATRIC:  Normal affect   Signed, Jenean Lindau, MD  04/12/2022 2:39 PM    [place]

## 2022-04-12 NOTE — Patient Instructions (Signed)

## 2022-07-16 DIAGNOSIS — F339 Major depressive disorder, recurrent, unspecified: Secondary | ICD-10-CM | POA: Diagnosis not present

## 2022-07-16 DIAGNOSIS — E785 Hyperlipidemia, unspecified: Secondary | ICD-10-CM | POA: Diagnosis not present

## 2022-07-16 DIAGNOSIS — I2510 Atherosclerotic heart disease of native coronary artery without angina pectoris: Secondary | ICD-10-CM | POA: Diagnosis not present

## 2022-07-16 DIAGNOSIS — F411 Generalized anxiety disorder: Secondary | ICD-10-CM | POA: Diagnosis not present

## 2022-08-08 DIAGNOSIS — M85851 Other specified disorders of bone density and structure, right thigh: Secondary | ICD-10-CM | POA: Diagnosis not present

## 2022-08-08 DIAGNOSIS — M85852 Other specified disorders of bone density and structure, left thigh: Secondary | ICD-10-CM | POA: Diagnosis not present

## 2022-08-29 ENCOUNTER — Other Ambulatory Visit: Payer: Self-pay | Admitting: Cardiology

## 2022-09-09 ENCOUNTER — Other Ambulatory Visit: Payer: Self-pay | Admitting: Cardiology

## 2022-09-09 NOTE — Telephone Encounter (Signed)
Rx refill sent to pharmacy. 

## 2022-09-25 DIAGNOSIS — Z1231 Encounter for screening mammogram for malignant neoplasm of breast: Secondary | ICD-10-CM | POA: Diagnosis not present

## 2022-10-14 DIAGNOSIS — E785 Hyperlipidemia, unspecified: Secondary | ICD-10-CM | POA: Diagnosis not present

## 2022-10-14 DIAGNOSIS — R739 Hyperglycemia, unspecified: Secondary | ICD-10-CM | POA: Diagnosis not present

## 2022-10-14 DIAGNOSIS — M8580 Other specified disorders of bone density and structure, unspecified site: Secondary | ICD-10-CM | POA: Diagnosis not present

## 2022-10-21 DIAGNOSIS — R030 Elevated blood-pressure reading, without diagnosis of hypertension: Secondary | ICD-10-CM | POA: Diagnosis not present

## 2022-10-21 DIAGNOSIS — R110 Nausea: Secondary | ICD-10-CM | POA: Diagnosis not present

## 2022-10-21 DIAGNOSIS — F411 Generalized anxiety disorder: Secondary | ICD-10-CM | POA: Diagnosis not present

## 2022-10-22 DIAGNOSIS — Z0000 Encounter for general adult medical examination without abnormal findings: Secondary | ICD-10-CM | POA: Diagnosis not present

## 2022-10-22 DIAGNOSIS — Z1339 Encounter for screening examination for other mental health and behavioral disorders: Secondary | ICD-10-CM | POA: Diagnosis not present

## 2022-10-22 DIAGNOSIS — Z1331 Encounter for screening for depression: Secondary | ICD-10-CM | POA: Diagnosis not present

## 2022-10-22 DIAGNOSIS — F339 Major depressive disorder, recurrent, unspecified: Secondary | ICD-10-CM | POA: Diagnosis not present

## 2022-10-29 ENCOUNTER — Other Ambulatory Visit: Payer: Self-pay | Admitting: Internal Medicine

## 2022-10-29 DIAGNOSIS — Z87891 Personal history of nicotine dependence: Secondary | ICD-10-CM

## 2022-12-11 DIAGNOSIS — Z79899 Other long term (current) drug therapy: Secondary | ICD-10-CM | POA: Diagnosis not present

## 2022-12-11 DIAGNOSIS — F419 Anxiety disorder, unspecified: Secondary | ICD-10-CM | POA: Diagnosis not present

## 2022-12-11 DIAGNOSIS — F330 Major depressive disorder, recurrent, mild: Secondary | ICD-10-CM | POA: Diagnosis not present

## 2022-12-18 ENCOUNTER — Ambulatory Visit
Admission: RE | Admit: 2022-12-18 | Discharge: 2022-12-18 | Disposition: A | Discharge status: Home / Self Care | Payer: Medicare Other | Source: Ambulatory Visit | Attending: Internal Medicine | Admitting: Internal Medicine

## 2022-12-18 DIAGNOSIS — Z122 Encounter for screening for malignant neoplasm of respiratory organs: Secondary | ICD-10-CM | POA: Diagnosis not present

## 2022-12-18 DIAGNOSIS — Z87891 Personal history of nicotine dependence: Secondary | ICD-10-CM | POA: Diagnosis not present

## 2022-12-18 DIAGNOSIS — I728 Aneurysm of other specified arteries: Secondary | ICD-10-CM | POA: Diagnosis not present

## 2022-12-18 DIAGNOSIS — J439 Emphysema, unspecified: Secondary | ICD-10-CM | POA: Diagnosis not present

## 2023-02-10 ENCOUNTER — Other Ambulatory Visit: Payer: Self-pay | Admitting: Cardiology

## 2023-02-13 DIAGNOSIS — F330 Major depressive disorder, recurrent, mild: Secondary | ICD-10-CM | POA: Diagnosis not present

## 2023-02-13 DIAGNOSIS — F419 Anxiety disorder, unspecified: Secondary | ICD-10-CM | POA: Diagnosis not present

## 2023-02-27 DIAGNOSIS — F419 Anxiety disorder, unspecified: Secondary | ICD-10-CM | POA: Diagnosis not present

## 2023-02-27 DIAGNOSIS — F330 Major depressive disorder, recurrent, mild: Secondary | ICD-10-CM | POA: Diagnosis not present

## 2023-03-19 DIAGNOSIS — F330 Major depressive disorder, recurrent, mild: Secondary | ICD-10-CM | POA: Diagnosis not present

## 2023-03-19 DIAGNOSIS — F419 Anxiety disorder, unspecified: Secondary | ICD-10-CM | POA: Diagnosis not present

## 2023-04-08 DIAGNOSIS — F330 Major depressive disorder, recurrent, mild: Secondary | ICD-10-CM | POA: Diagnosis not present

## 2023-04-08 DIAGNOSIS — F419 Anxiety disorder, unspecified: Secondary | ICD-10-CM | POA: Diagnosis not present

## 2023-04-10 ENCOUNTER — Other Ambulatory Visit: Payer: Self-pay | Admitting: Cardiology

## 2023-04-25 DIAGNOSIS — J439 Emphysema, unspecified: Secondary | ICD-10-CM | POA: Diagnosis not present

## 2023-04-25 DIAGNOSIS — Z23 Encounter for immunization: Secondary | ICD-10-CM | POA: Diagnosis not present

## 2023-05-15 ENCOUNTER — Other Ambulatory Visit: Payer: Self-pay

## 2023-05-15 DIAGNOSIS — J439 Emphysema, unspecified: Secondary | ICD-10-CM

## 2023-05-15 NOTE — Progress Notes (Deleted)
Cardiology Office Note:  .   Date:  05/15/2023  ID:  Carol Love, DOB [birth year], MRN [id number] PCP: Melida Quitter, MD  [facility] Providers Cardiologist:  None { Click to update primary MD,subspecialty MD or APP then REFRESH:1}   History of Present Illness: .   Carol Love is a 72 y.o. female with a past medical history of hypertension, CAD, hyperlipidemia, anxiety, depression.  12/11/2018 left heart cath revealed patent mid RCA stent, proximal stenosis of LAD--no indication for treatment, 30% proximal to mid circumflex  Last evaluated by Dr. Tomie China on 04/12/2022, it appears she was doing well from a cardiac perspective and advised to follow-up in 9 months.  ROS: ***  Studies Reviewed: .        *** Risk Assessment/Calculations:   {Does this patient have ATRIAL FIBRILLATION?:[id number]} No BP recorded.  {Refresh Note OR Click here to enter BP  :1}***       Physical Exam:   VS:  LMP  (LMP Unknown)    Wt Readings from Last 3 Encounters:  04/12/22 128 lb 12.8 oz (58.4 kg)  09/26/21 127 lb 3.2 oz (57.7 kg)  08/15/21 129 lb 12.8 oz (58.9 kg)    GEN: Well nourished, well developed in no acute distress NECK: No JVD; No carotid bruits CARDIAC: ***RRR, no murmurs, rubs, gallops RESPIRATORY:  Clear to auscultation without rales, wheezing or rhonchi  ABDOMEN: Soft, non-tender, non-distended EXTREMITIES:  No edema; No deformity   ASSESSMENT AND PLAN: .   ***    {Are you ordering a CV Procedure (e.g. stress test, cath, DCCV, TEE, etc)?   Press F2        :[id number]}  Dispo: ***  Signed, Flossie Dibble, NP

## 2023-05-16 ENCOUNTER — Observation Stay (HOSPITAL_COMMUNITY)
Admission: EM | Admit: 2023-05-16 | Discharge: 2023-05-17 | Disposition: A | Discharge status: Home / Self Care | Payer: Medicare Other | Attending: Internal Medicine | Admitting: Internal Medicine

## 2023-05-16 ENCOUNTER — Ambulatory Visit: Payer: Medicare Other | Attending: Cardiology | Admitting: Cardiology

## 2023-05-16 ENCOUNTER — Observation Stay (HOSPITAL_COMMUNITY): Payer: Medicare Other | Admitting: Anesthesiology

## 2023-05-16 ENCOUNTER — Observation Stay (HOSPITAL_BASED_OUTPATIENT_CLINIC_OR_DEPARTMENT_OTHER): Payer: Medicare Other | Admitting: Anesthesiology

## 2023-05-16 ENCOUNTER — Emergency Department (HOSPITAL_COMMUNITY): Payer: Medicare Other

## 2023-05-16 ENCOUNTER — Other Ambulatory Visit: Payer: Self-pay

## 2023-05-16 ENCOUNTER — Encounter (HOSPITAL_COMMUNITY)
Admission: EM | Disposition: A | Discharge status: Home / Self Care | Payer: Self-pay | Source: Home / Self Care | Attending: Emergency Medicine

## 2023-05-16 ENCOUNTER — Encounter (HOSPITAL_COMMUNITY): Payer: Self-pay | Admitting: Emergency Medicine

## 2023-05-16 DIAGNOSIS — Z79899 Other long term (current) drug therapy: Secondary | ICD-10-CM | POA: Diagnosis not present

## 2023-05-16 DIAGNOSIS — K2951 Unspecified chronic gastritis with bleeding: Secondary | ICD-10-CM | POA: Diagnosis not present

## 2023-05-16 DIAGNOSIS — E785 Hyperlipidemia, unspecified: Secondary | ICD-10-CM | POA: Diagnosis present

## 2023-05-16 DIAGNOSIS — I10 Essential (primary) hypertension: Secondary | ICD-10-CM | POA: Diagnosis present

## 2023-05-16 DIAGNOSIS — E782 Mixed hyperlipidemia: Secondary | ICD-10-CM

## 2023-05-16 DIAGNOSIS — Z7982 Long term (current) use of aspirin: Secondary | ICD-10-CM | POA: Insufficient documentation

## 2023-05-16 DIAGNOSIS — I2510 Atherosclerotic heart disease of native coronary artery without angina pectoris: Secondary | ICD-10-CM

## 2023-05-16 DIAGNOSIS — K2970 Gastritis, unspecified, without bleeding: Secondary | ICD-10-CM

## 2023-05-16 DIAGNOSIS — K254 Chronic or unspecified gastric ulcer with hemorrhage: Secondary | ICD-10-CM

## 2023-05-16 DIAGNOSIS — F1090 Alcohol use, unspecified, uncomplicated: Secondary | ICD-10-CM | POA: Insufficient documentation

## 2023-05-16 DIAGNOSIS — Z955 Presence of coronary angioplasty implant and graft: Secondary | ICD-10-CM | POA: Insufficient documentation

## 2023-05-16 DIAGNOSIS — Z789 Other specified health status: Secondary | ICD-10-CM

## 2023-05-16 DIAGNOSIS — K922 Gastrointestinal hemorrhage, unspecified: Principal | ICD-10-CM | POA: Diagnosis present

## 2023-05-16 DIAGNOSIS — K449 Diaphragmatic hernia without obstruction or gangrene: Secondary | ICD-10-CM | POA: Diagnosis not present

## 2023-05-16 DIAGNOSIS — I700 Atherosclerosis of aorta: Secondary | ICD-10-CM | POA: Diagnosis not present

## 2023-05-16 DIAGNOSIS — K921 Melena: Secondary | ICD-10-CM

## 2023-05-16 DIAGNOSIS — D649 Anemia, unspecified: Secondary | ICD-10-CM | POA: Diagnosis not present

## 2023-05-16 DIAGNOSIS — K259 Gastric ulcer, unspecified as acute or chronic, without hemorrhage or perforation: Secondary | ICD-10-CM | POA: Diagnosis not present

## 2023-05-16 DIAGNOSIS — D62 Acute posthemorrhagic anemia: Secondary | ICD-10-CM | POA: Insufficient documentation

## 2023-05-16 DIAGNOSIS — I728 Aneurysm of other specified arteries: Secondary | ICD-10-CM | POA: Diagnosis not present

## 2023-05-16 DIAGNOSIS — Z87891 Personal history of nicotine dependence: Secondary | ICD-10-CM | POA: Diagnosis not present

## 2023-05-16 DIAGNOSIS — K5790 Diverticulosis of intestine, part unspecified, without perforation or abscess without bleeding: Secondary | ICD-10-CM | POA: Diagnosis not present

## 2023-05-16 DIAGNOSIS — R0902 Hypoxemia: Secondary | ICD-10-CM | POA: Diagnosis not present

## 2023-05-16 DIAGNOSIS — R58 Hemorrhage, not elsewhere classified: Secondary | ICD-10-CM | POA: Diagnosis not present

## 2023-05-16 DIAGNOSIS — M48061 Spinal stenosis, lumbar region without neurogenic claudication: Secondary | ICD-10-CM | POA: Diagnosis present

## 2023-05-16 HISTORY — PX: ESOPHAGOGASTRODUODENOSCOPY (EGD) WITH PROPOFOL: SHX5813

## 2023-05-16 HISTORY — DX: Other specified health status: Z78.9

## 2023-05-16 HISTORY — DX: Melena: K92.1

## 2023-05-16 HISTORY — DX: Gastrointestinal hemorrhage, unspecified: K92.2

## 2023-05-16 HISTORY — DX: Acute posthemorrhagic anemia: D62

## 2023-05-16 HISTORY — PX: SUBMUCOSAL INJECTION: SHX5543

## 2023-05-16 HISTORY — DX: Alcohol use, unspecified, uncomplicated: F10.90

## 2023-05-16 HISTORY — PX: BIOPSY: SHX5522

## 2023-05-16 HISTORY — PX: HEMOSTASIS CONTROL: SHX6838

## 2023-05-16 HISTORY — DX: Chronic or unspecified gastric ulcer with hemorrhage: K25.4

## 2023-05-16 LAB — PREPARE RBC (CROSSMATCH)

## 2023-05-16 LAB — COMPREHENSIVE METABOLIC PANEL
ALT: 19 U/L (ref 0–44)
AST: 20 U/L (ref 15–41)
Albumin: 2.8 g/dL — ABNORMAL LOW (ref 3.5–5.0)
Alkaline Phosphatase: 26 U/L — ABNORMAL LOW (ref 38–126)
Anion gap: 9 (ref 5–15)
BUN: 37 mg/dL — ABNORMAL HIGH (ref 8–23)
CO2: 18 mmol/L — ABNORMAL LOW (ref 22–32)
Calcium: 7.2 mg/dL — ABNORMAL LOW (ref 8.9–10.3)
Chloride: 108 mmol/L (ref 98–111)
Creatinine, Ser: 0.64 mg/dL (ref 0.44–1.00)
GFR, Estimated: >60 mL/min (ref >60)
Glucose, Bld: 130 mg/dL — ABNORMAL HIGH (ref 70–99)
Potassium: 4 mmol/L (ref 3.5–5.1)
Sodium: 135 mmol/L (ref 135–145)
Total Bilirubin: 0.5 mg/dL (ref 0.3–1.2)
Total Protein: 4.5 g/dL — ABNORMAL LOW (ref 6.5–8.1)

## 2023-05-16 LAB — CBC WITH DIFFERENTIAL/PLATELET
Abs Immature Granulocytes: 0.03 10*3/uL (ref 0.00–0.07)
Basophils Absolute: 0 10*3/uL (ref 0.0–0.1)
Basophils Relative: 0 %
Eosinophils Absolute: 0 10*3/uL (ref 0.0–0.5)
Eosinophils Relative: 0 %
HCT: 21.9 % — ABNORMAL LOW (ref 36.0–46.0)
Hemoglobin: 7.3 g/dL — ABNORMAL LOW (ref 12.0–15.0)
Immature Granulocytes: 0 %
Lymphocytes Relative: 15 %
Lymphs Abs: 1 10*3/uL (ref 0.7–4.0)
MCH: 34.3 pg — ABNORMAL HIGH (ref 26.0–34.0)
MCHC: 33.3 g/dL (ref 30.0–36.0)
MCV: 102.8 fL — ABNORMAL HIGH (ref 80.0–100.0)
Monocytes Absolute: 0.4 10*3/uL (ref 0.1–1.0)
Monocytes Relative: 6 %
Neutro Abs: 5.4 10*3/uL (ref 1.7–7.7)
Neutrophils Relative %: 79 %
Platelets: 153 10*3/uL (ref 150–400)
RBC: 2.13 MIL/uL — ABNORMAL LOW (ref 3.87–5.11)
RDW: 11.8 % (ref 11.5–15.5)
WBC: 6.9 10*3/uL (ref 4.0–10.5)
nRBC: 0 % (ref 0.0–0.2)

## 2023-05-16 LAB — PROTIME-INR
INR: 1.4 — ABNORMAL HIGH (ref 0.8–1.2)
Prothrombin Time: 17.5 seconds — ABNORMAL HIGH (ref 11.4–15.2)

## 2023-05-16 LAB — HEMOGLOBIN AND HEMATOCRIT, BLOOD
HCT: 23.2 % — ABNORMAL LOW (ref 36.0–46.0)
Hemoglobin: 8.2 g/dL — ABNORMAL LOW (ref 12.0–15.0)

## 2023-05-16 LAB — POC OCCULT BLOOD, ED: Fecal Occult Bld: POSITIVE — AB

## 2023-05-16 SURGERY — ESOPHAGOGASTRODUODENOSCOPY (EGD) WITH PROPOFOL
Anesthesia: Monitor Anesthesia Care

## 2023-05-16 MED ORDER — SODIUM CHLORIDE 0.9% IV SOLUTION: Freq: Once | INTRAVENOUS | Status: DC

## 2023-05-16 MED ORDER — PANTOPRAZOLE 80MG IVPB - SIMPLE MED
80.0000 mg | Freq: Once | INTRAVENOUS | Status: AC
  Administered 2023-05-16: 80 mg via INTRAVENOUS
  Filled 2023-05-16: qty 100

## 2023-05-16 MED ORDER — LACTATED RINGERS IV SOLN: INTRAVENOUS | Status: DC | PRN

## 2023-05-16 MED ORDER — PANTOPRAZOLE INFUSION (NEW) - SIMPLE MED
8.0000 mg/h | INTRAVENOUS | Status: DC
  Administered 2023-05-16 – 2023-05-17 (×2): 8 mg/h via INTRAVENOUS
  Filled 2023-05-16 (×3): qty 100

## 2023-05-16 MED ORDER — PROPOFOL 500 MG/50ML IV EMUL
INTRAVENOUS | Status: DC | PRN
  Administered 2023-05-16: 150 ug/kg/min via INTRAVENOUS

## 2023-05-16 MED ORDER — LIDOCAINE 2% (20 MG/ML) 5 ML SYRINGE
INTRAMUSCULAR | Status: DC | PRN
  Administered 2023-05-16: 60 mg via INTRAVENOUS

## 2023-05-16 MED ORDER — IOHEXOL 350 MG/ML SOLN
75.0000 mL | Freq: Once | INTRAVENOUS | Status: AC | PRN
  Administered 2023-05-16: 75 mL via INTRAVENOUS

## 2023-05-16 MED ORDER — EPINEPHRINE 1 MG/10ML IJ SOSY
PREFILLED_SYRINGE | INTRAMUSCULAR | Status: AC
  Filled 2023-05-16: qty 10

## 2023-05-16 MED ORDER — SODIUM CHLORIDE 0.9 % IV SOLN: INTRAVENOUS | Status: DC

## 2023-05-16 MED ORDER — PHENYLEPHRINE 80 MCG/ML (10ML) SYRINGE FOR IV PUSH (FOR BLOOD PRESSURE SUPPORT)
PREFILLED_SYRINGE | INTRAVENOUS | Status: DC | PRN
  Administered 2023-05-16: 80 ug via INTRAVENOUS
  Administered 2023-05-16: 160 ug via INTRAVENOUS
  Administered 2023-05-16 (×2): 80 ug via INTRAVENOUS
  Administered 2023-05-16: 160 ug via INTRAVENOUS
  Administered 2023-05-16: 80 ug via INTRAVENOUS
  Administered 2023-05-16: 160 ug via INTRAVENOUS
  Administered 2023-05-16: 80 ug via INTRAVENOUS

## 2023-05-16 MED ORDER — SODIUM CHLORIDE 0.9 % IV BOLUS
1000.0000 mL | Freq: Once | INTRAVENOUS | Status: AC
  Administered 2023-05-16: 1000 mL via INTRAVENOUS

## 2023-05-16 MED ORDER — PROPOFOL 10 MG/ML IV BOLUS
INTRAVENOUS | Status: DC | PRN
  Administered 2023-05-16: 70 mg via INTRAVENOUS

## 2023-05-16 MED ORDER — EPINEPHRINE 1 MG/10ML IJ SOSY
PREFILLED_SYRINGE | INTRAMUSCULAR | Status: DC | PRN
  Administered 2023-05-16: .3 mg via INTRAVENOUS

## 2023-05-16 SURGICAL SUPPLY — 15 items

## 2023-05-16 NOTE — ED Notes (Signed)
Electronic blood consent signed by patient and this RN.

## 2023-05-16 NOTE — Assessment & Plan Note (Signed)
Observation med/tele bed. Continue with IV protonix. GI has been consulted. Keep NPO. Gentle Ivf.

## 2023-05-16 NOTE — Interval H&P Note (Signed)
History and Physical Interval Note:  05/16/2023 2:24 PM  Carol Love  has presented today for surgery, with the diagnosis of Melena.  The various methods of treatment have been discussed with the patient and family. After consideration of risks, benefits and other options for treatment, the patient has consented to  Procedure(s): ESOPHAGOGASTRODUODENOSCOPY (EGD) WITH PROPOFOL (N/A) as a surgical intervention.  The patient's history has been reviewed, patient examined, no change in status, stable for surgery.  I have reviewed the patient's chart and labs.  Questions were answered to the patient's satisfaction.     Charlie Pitter III

## 2023-05-16 NOTE — H&P (Signed)
History and Physical    OLIE TEAT XLK:[id number] DOB: 1951/05/14 DOA: 05/16/2023  DOS: the patient was seen and examined on 05/16/2023  PCP: Melida Quitter, MD   Patient coming from: Home  I have personally briefly reviewed patient's old medical records in [facility]  CC: GI bleeding HPI: 72 year old Caucasian female history of hypertension, coronary disease, hyperlipidemia, spinal stenosis who presents to the ER today with sudden onset of melanotic stools.  She states that she woke up this morning had a large bloody melanotic bowel movement.  Denied any chest pain.  Denied any abdominal pain.  Blood pressure was borderline when EMS was called.  Patient given IV fluids.  Patient does take aspirin and meloxicam on a daily basis.  EMS run sheet reports initial blood pressure was 82 mmHg by palpation.  Arrival to ER temp 97.6 heart rate 45 blood pressure 96/63 sats 99% on room air.  White count 6.9, hemoglobin 7.3, platelets of 153, MCV of 102.8  INR 1.4  Sodium 135, potassium 4.0, bicarb 18, BUN of 37, creatinine 0.6, glucose 130  Total protein 4.8, albumin 2.8  AST 20 ALT 19 alk phos of 26  CT angio abdomen pelvis was negative for GI bleed.  1 unit of packed cells ordered.  GI was consulted.  [facility] contacted for admission.   ED Course: HgB 7.3, hemoccult positive. CTA negative for GI bleeding  Review of Systems:  Review of Systems  Constitutional: Negative.   HENT: Negative.    Eyes: Negative.   Respiratory: Negative.    Cardiovascular: Negative.   Gastrointestinal:  Positive for melena. Negative for abdominal pain, diarrhea and vomiting.  Genitourinary: Negative.   Musculoskeletal:  Positive for back pain.       Chronic back pain  Skin: Negative.   Neurological: Negative.   Endo/Heme/Allergies: Negative.   Psychiatric/Behavioral: Negative.    All other systems reviewed and are negative.   Past Medical History:  Diagnosis Date    Abnormal nuclear stress test 12/08/2018   Anxiety    Arthritis    CAD (coronary artery disease) 08/15/2021   Cervical spondylosis 02/07/2021   Constipation    uses stool softener with stimulant   Coronary artery disease involving native coronary artery of native heart without angina pectoris 03/01/2015   Overview:  2014 ST elevation MI  Formatting of this note might be different from the original. 2014 ST elevation MI   Degenerative spondylolisthesis 03/20/2015   Depression    Disorder of bone 10/27/2014   Disorder of sacroiliac joint 12/19/2015   Encounter for general adult medical examination without abnormal findings 08/15/2021   Essential (primary) hypertension 04/26/2010   Generalized anxiety disorder 01/06/2020   Hardening of the aorta (main artery of the heart) (HCC) 03/01/2015   Overview:  2014 ST elevation MI  Formatting of this note might be different from the original. 2014 ST elevation MI   Headache    last h/a  2001   Hyperglycemia 01/06/2020   Hyperlipidemia    Irregular heart beat    hx - no problems since taking acebutolol   Myocardial infarction [facility]) 2014   stent   Nicotine dependence 01/06/2020   Overweight 01/06/2020   Psoriasis    Pulmonary emphysema (HCC) 05/15/2023   Recurrent major depression (HCC) 09/26/2021   Spinal stenosis of lumbar region 05/10/2010   Spondylolisthesis 10/27/2014   SVD (spontaneous vaginal delivery)    x 3    Past Surgical History:  Procedure Laterality Date  APPENDECTOMY  05   CARDIAC CATHETERIZATION  2014   stent   CARDIOVASCULAR STRESS TEST  03/07/15   COLONOSCOPY     CORONARY ANGIOPLASTY     DES RCA 12/13/12   CORONARY STENT PLACEMENT     DILATION AND CURETTAGE OF UTERUS     LEFT HEART CATH AND CORONARY ANGIOGRAPHY N/A 12/10/2018   Procedure: LEFT HEART CATH AND CORONARY ANGIOGRAPHY;  Surgeon: Lyn Records, MD;  Location: [facility] INVASIVE CV LAB;  Service: Cardiovascular;  Laterality: N/A;   MAXIMUM ACCESS (MAS)POSTERIOR  LUMBAR INTERBODY FUSION (PLIF) 1 LEVEL N/A 03/20/2015   Procedure: FOR MAXIMUM ACCESS (MAS) POSTERIOR LUMBAR INTERBODY FUSION (PLIF) 1 LEVEL;  Surgeon: Julio Sicks, MD;  Location: [facility] NEURO ORS;  Service: Neurosurgery;  Laterality: N/A;  FOR MAXIMUM ACCESS (MAS) POSTERIOR LUMBAR INTERBODY FUSION (PLIF) 1 LEVEL LUMBAR FOUR-FIVE   NASAL SEPTUM SURGERY  1985   s/p fall   SHOULDER ARTHROSCOPY Left 08   TONSILLECTOMY     TOOTH EXTRACTION     4 teeth pulled prior to braces as a child   WISDOM TOOTH EXTRACTION       reports that she quit smoking about 10 years ago. Her smoking use included cigarettes. She started smoking about 55 years ago. She has a 22.5 pack-year smoking history. She has never used smokeless tobacco. She reports current alcohol use of about 21.0 standard drinks of alcohol per week. She reports that she does not use drugs.  Allergies  Allergen Reactions   Epinephrine Other (See Comments)    Gets migraines  Pt cannot recall this allergy.   Ibuprofen Itching and Swelling   Penicillins Rash    Did it involve swelling of the face/tongue/throat, SOB, or low BP? Unknown Did it involve sudden or severe rash/hives, skin peeling, or any reaction on the inside of your mouth or nose? No Did you need to seek medical attention at a hospital or doctor's office? Unknown When did it last happen?      Childhood allergy If all above answers are "NO", may proceed with cephalosporin use.    Sulfa Antibiotics Rash    Pt states this is a childhood allergy    Family History  Problem Relation Age of Onset   Heart disease Mother    Hyperlipidemia Mother    AAA (abdominal aortic aneurysm) Mother    Lung cancer Mother    Heart disease Father    Colon cancer Neg Hx    Colon polyps Neg Hx    Esophageal cancer Neg Hx    Rectal cancer Neg Hx    Stomach cancer Neg Hx     Prior to Admission medications   Medication Sig Start Date End Date Taking? Authorizing Provider  acebutolol (SECTRAL) 200  MG capsule Take 1 capsule (200 mg total) by mouth daily. 08/23/20  Yes Revankar, Aundra Dubin, MD  alendronate (FOSAMAX) 70 MG tablet Take 70 mg by mouth once a week. 04/05/23  Yes [provider]  ALPRAZolam Prudy Feeler) 0.5 MG tablet Take 0.5 mg by mouth in the morning. And take 1 mg in the evening 01/17/15  Yes [provider]  amitriptyline (ELAVIL) 50 MG tablet Take 50 mg by mouth at bedtime. 12/20/14  Yes [provider]  amLODipine (NORVASC) 5 MG tablet Take 1 tablet (5 mg total) by mouth daily. 02/10/23  Yes Revankar, Aundra Dubin, MD  ARIPiprazole (ABILIFY) 5 MG tablet Take 5 mg by mouth at bedtime. 05/08/23  Yes [provider]  aspirin  EC 81 MG tablet Take 81 mg by mouth every evening.   Yes [provider]  atorvastatin (LIPITOR) 40 MG tablet TAKE 1 TABLET BY MOUTH DAILY IN THE EVENING 04/10/23  Yes Revankar, Aundra Dubin, MD  busPIRone (BUSPAR) 15 MG tablet Take 15 mg by mouth 2 (two) times daily. 09/25/21  Yes [provider]  Calcium Citrate-Vitamin D (CALCIUM + D PO) Take 1 tablet by mouth daily.   Yes [provider]  DULoxetine (CYMBALTA) 30 MG capsule Take 30 mg by mouth daily. Take with   Yes [provider]  DULoxetine (CYMBALTA) 60 MG capsule Take 60 mg by mouth daily. 08/17/21  Yes [provider]  meloxicam (MOBIC) 15 MG tablet Take 15 mg by mouth daily. 12/17/19  Yes [provider]  Multiple Vitamin (MULTIVITAMIN) capsule Take 1 capsule by mouth daily.   Yes [provider]  Polyethylene Glycol 3350 (MIRALAX PO) Take 1 Scoop by mouth daily as needed for constipation.   Yes [provider]  traMADol (ULTRAM) 50 MG tablet Take 50 mg by mouth every 6 (six) hours as needed for pain. Patient not taking: Reported on 05/16/2023    [provider]    Physical Exam: Vitals:   05/16/23 1230 05/16/23 1300 05/16/23 1315 05/16/23 1330  BP: 99/78 111/75 113/73 103/76  Pulse: 81 83 79 83  Resp: 15  18 14 16   Temp:      TempSrc:      SpO2: 100% 100% 100% 100%  Weight:      Height:        Physical Exam Vitals and nursing note reviewed.  Constitutional:      General: She is not in acute distress.    Appearance: She is normal weight. She is not ill-appearing, toxic-appearing or diaphoretic.  HENT:     Head: Normocephalic.  Eyes:     General: No scleral icterus. Cardiovascular:     Rate and Rhythm: Normal rate and regular rhythm.  Pulmonary:     Effort: Pulmonary effort is normal.     Breath sounds: Normal breath sounds.  Abdominal:     General: Abdomen is flat. Bowel sounds are normal. There is no distension.     Palpations: Abdomen is soft.     Tenderness: There is no abdominal tenderness. There is no guarding.  Musculoskeletal:     Right lower leg: No edema.     Left lower leg: No edema.  Skin:    General: Skin is warm and dry.     Capillary Refill: Capillary refill takes less than 2 seconds.     Coloration: Skin is pale.  Neurological:     General: No focal deficit present.     Mental Status: She is alert and oriented to person, place, and time.      Labs on Admission: I have personally reviewed following labs and imaging studies  CBC: Recent Labs  Lab 05/16/23 1022  WBC 6.9  NEUTROABS 5.4  HGB 7.3*  HCT 21.9*  MCV 102.8*  PLT 153   Basic Metabolic Panel: Recent Labs  Lab 05/16/23 1022  NA 135  K 4.0  CL 108  CO2 18*  GLUCOSE 130*  BUN 37*  CREATININE 0.64  CALCIUM 7.2*   GFR: Estimated Creatinine Clearance: 47.9 mL/min (by C-G formula based on SCr of 0.64 mg/dL). Liver Function Tests: Recent Labs  Lab 05/16/23 1022  AST 20  ALT 19  ALKPHOS 26*  BILITOT 0.5  PROT 4.5*  ALBUMIN 2.8*   Coagulation Profile: Recent Labs  Lab 05/16/23 1022  INR 1.4*   Radiological Exams on Admission: I have personally reviewed images CT Angio Abd/Pel W and/or Wo Contrast  Result Date: 05/16/2023 CLINICAL DATA:  72 year old female with a history of  lower GI bleeding EXAM: CTA ABDOMEN AND PELVIS WITHOUT AND WITH CONTRAST TECHNIQUE: Multidetector CT imaging of the abdomen and pelvis was performed using the standard protocol during bolus administration of intravenous contrast. Multiplanar reconstructed images and MIPs were obtained and reviewed to evaluate the vascular anatomy. RADIATION DOSE REDUCTION: This exam was performed according to the departmental dose-optimization program which includes automated exposure control, adjustment of the mA and/or kV according to patient size and/or use of iterative reconstruction technique. CONTRAST:  75mL OMNIPAQUE IOHEXOL 350 MG/ML SOLN COMPARISON:  None Available. FINDINGS: VASCULAR Aorta: Mild atherosclerotic changes of the distal thoracic and abdominal aorta. No pedunculated plaque, ulcerated plaque. No dissection or aneurysm. Celiac: Celiac artery patent. Distal splenic artery aneurysm, estimated 10 mm. 50% circumferential wall calcifications. No inflammatory changes. There is no comparison available with contrast. Prior chest CT 12/18/2022, approximately 10 mm previously on the noncontrast. SMA: Patent with no significant atherosclerosis. Replaced right hepatic artery. Renals: - Right: Right renal artery patent. - Left: Left renal artery patent. IMA: Inferior mesenteric artery is patent, though small caliber. Right lower extremity: Unremarkable course, caliber, and contour of the right iliac system. Minimal atherosclerosis. No aneurysm, dissection, or occlusion. Hypogastric artery is patent. Common femoral artery patent. Proximal SFA and profunda femoris patent. Left lower extremity: Unremarkable course, caliber, and contour of the left iliac system. Minimal atherosclerosis. No aneurysm, dissection, or occlusion. Hypogastric artery is patent. Common femoral artery patent. Proximal SFA and profunda femoris patent. Veins: Unremarkable appearance of the venous system. Review of the MIP images confirms the above findings.  NON-VASCULAR Lower chest: No acute. Hepatobiliary: Unremarkable appearance of the liver. Unremarkable gall bladder. Pancreas: Unremarkable. Spleen: Unremarkable. Adrenals/Urinary Tract: - Right adrenal gland: Unremarkable - Left adrenal gland: Unremarkable. - Right kidney: No hydronephrosis, nephrolithiasis, inflammation, or ureteral dilation. No focal lesion. - Left Kidney: No hydronephrosis, nephrolithiasis, inflammation, or ureteral dilation. No focal lesion. - Urinary Bladder: Urinary bladder somewhat distended. Stomach/Bowel: - Stomach: Small hiatal hernia.  Otherwise unremarkable. - Small bowel: Unremarkable - Appendix: Normal. - Colon: Fluid-filled right colon. No accumulation of contrast on the arterial phase or the venous phase. No inflammatory changes. No wall thickening. Enhancement within normal limits. Minimal diverticular change. Lymphatic: No adenopathy. Mesenteric: No free fluid or air. No mesenteric adenopathy. Reproductive: Unremarkable appearance of the pelvic organs. Other: No hernia. Musculoskeletal: Surgical changes posterior lumbar interbody fusion with bilateral pedicle screw and rod fixation spanning L2-L5. Schmorl's node L1. No displaced fracture. IMPRESSION: CT angiogram negative for evidence of acute GI hemorrhage. 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 Aortic Atherosclerosis (ICD10-I70.0). Additional ancillary findings as above. Signed, Yvone Neu. Miachel Roux, RPVI [facility] Electronically Signed   By: Gilmer Mor D.O.   On: 05/16/2023 12:52   DG Chest 1 View  Result Date: 05/16/2023 CLINICAL DATA:  GI bleed EXAM: CHEST  1 VIEW COMPARISON:  Chest radiograph dated 12/08/2018 FINDINGS: Normal lung volumes. No focal consolidations. No pleural effusion or pneumothorax. The heart size and mediastinal contours are within normal limits. No acute osseous abnormality. IMPRESSION: No active disease. Electronically Signed   By: Agustin Cree M.D.   On: 05/16/2023 10:47    EKG: My personal interpretation of EKG shows:  NSR    Assessment/Plan Principal Problem:   GI bleeding Active Problems:   Acute blood loss anemia   Essential (primary) hypertension   Hyperlipidemia   Spinal stenosis of lumbar region   CAD (coronary artery disease)   Alcohol use    Assessment and Plan: * GI bleeding Observation med/tele bed. Continue with IV protonix. GI has been consulted. Keep NPO. Gentle Ivf.  Acute blood loss anemia Pt denies being a Jehovah's witness. Agreeable to PRBC transfusion. Repeat HgB at 8 pm. Goal is HgB >8 g/dl.  CAD (coronary artery disease) Will hold ASA.  Spinal stenosis of lumbar region Pt takes daily Mobic 15 mg for her back. She takes it with food in the AM with breakfast. Does not take H2 blocker or PPI on a regular basis.  Hyperlipidemia Stable. Continue lipitor.  Essential (primary) hypertension Due to acute blood loss anemia, will hold HTN meds for now.  Alcohol use Pt is ethnic [place] and drinks 3 glasses of wine per day. She states she has does this for a number of decades. Has never had difficulty with alcohol use. Has never had trouble with stopping alcohol use.   DVT prophylaxis: SCDs Code Status: Full Code Family Communication: no family at bedside  Disposition Plan: return home  Consults called: EDP has consulted GI([place])  Admission status: Observation, Telemetry bed   Carollee Herter, DO [facility] 05/16/2023, 1:46 PM

## 2023-05-16 NOTE — Subjective & Objective (Signed)
CC: GI bleeding HPI: 72 year old Caucasian female history of hypertension, coronary disease, hyperlipidemia, spinal stenosis who presents to the ER today with sudden onset of melanotic stools.  She states that she woke up this morning had a large bloody melanotic bowel movement.  Denied any chest pain.  Denied any abdominal pain.  Blood pressure was borderline when EMS was called.  Patient given IV fluids.  Patient does take aspirin and meloxicam on a daily basis.  EMS run sheet reports initial blood pressure was 82 mmHg by palpation.  Arrival to ER temp 97.6 heart rate 45 blood pressure 96/63 sats 99% on room air.  White count 6.9, hemoglobin 7.3, platelets of 153, MCV of 102.8  INR 1.4  Sodium 135, potassium 4.0, bicarb 18, BUN of 37, creatinine 0.6, glucose 130  Total protein 4.8, albumin 2.8  AST 20 ALT 19 alk phos of 26  CT angio abdomen pelvis was negative for GI bleed.  1 unit of packed cells ordered.  GI was consulted.  [facility] hospitalist contacted for admission.

## 2023-05-16 NOTE — Op Note (Signed)
[facility] Patient Name: Carol Love Procedure Date : 05/16/2023 MRN: [id number] Attending MD: Starr Lake. Myrtie Neither , MD, [id number] Date of Birth: 02-17-1951 CSN: [id number] Age: 72 Admit Type: Inpatient Procedure:                Upper GI endoscopy Indications:              Acute post hemorrhagic anemia, Melena Providers:                Sherilyn Cooter L. Myrtie Neither, MD, Geralyn Corwin, RN, Marja Kays, Technician Referring MD:             [facility] Medicines:                Monitored Anesthesia Care Complications:            No immediate complications. Estimated Blood Loss:     Estimated blood loss was minimal. Procedure:                Pre-Anesthesia Assessment:                           - Prior to the procedure, a History and Physical                            was performed, and patient medications and                            allergies were reviewed. The patient's tolerance of                            previous anesthesia was also reviewed. The risks                            and benefits of the procedure and the sedation                            options and risks were discussed with the patient.                            All questions were answered, and informed consent                            was obtained. Prior Anticoagulants: The patient has                            taken no anticoagulant or antiplatelet agents. ASA                            Grade Assessment: III - A patient with severe                            systemic disease. After reviewing the risks and  benefits, the patient was deemed in satisfactory                            condition to undergo the procedure.                           After obtaining informed consent, the endoscope was                            passed under direct vision. Throughout the                            procedure, the patient's blood pressure, pulse, and                             oxygen saturations were monitored continuously. The                            [id number]-[id number] ([id number]) Olympus endoscope was introduced                            through the mouth, and advanced to the fourth part                            of duodenum. The upper GI endoscopy was                            accomplished without difficulty. The patient                            tolerated the procedure well. Scope In: Scope Out: Findings:      The larynx was normal.      The esophagus was normal.      Two non-bleeding superficial gastric ulcers with no stigmata of bleeding       were found in the prepyloric region of the stomach. The largest lesion       was 5 mm in largest dimension. Several biopsies were obtained in the       gastric body and in the gastric antrum with cold forceps for histology       (rule out H. pylori). There was an additional diminutive erosion in the       gastric fundus.      One non-bleeding superficial gastric ulcer with pigmented material (flat       red spot) was found in the prepyloric region of the stomach on the       posterior wall. The lesion was 6 mm in largest dimension and initially       difficult to see due to its location and edematous edges. The area was       successfully injected with 3 mL of a 0.01 mg/mL solution of epinephrine       for hemostasis. Coagulation for hemostasis using bipolar probe was       successful.      The exam of the stomach was otherwise normal. No fresh or old blood in       the upper digestive tract.  The examined duodenum was normal. (Clear bile in the duodenum) Impression:               - Normal larynx.                           - Normal esophagus.                           - Non-bleeding gastric ulcers with no stigmata of                            bleeding.                           - Non-bleeding gastric ulcer with pigmented                            material. Injected. Treated with bipolar  cautery.                           - Normal examined duodenum.                           - Several biopsies were obtained in the gastric                            body and in the gastric antrum. Recommendation:           - Return patient to hospital ward for ongoing care.                           - Resume regular diet.                           - Continue Protonix drip until tomorrow morning,                            then changed to 40 mg tablet twice daily                           Follow-up biopsies as an outpatient, treat if H.                            pylori positive                           Discontinue aspirin Procedure Code(s):        --- Professional ---                           43255, 59, Esophagogastroduodenoscopy, flexible,                            transoral; with control of bleeding, any method                           43239, Esophagogastroduodenoscopy, flexible,  transoral; with biopsy, single or multiple Diagnosis Code(s):        --- Professional ---                           K25.9, Gastric ulcer, unspecified as acute or                            chronic, without hemorrhage or perforation                           D62, Acute posthemorrhagic anemia                           K92.1, Melena (includes Hematochezia) CPT copyright 2022 American Medical Association. All rights reserved. The codes documented in this report are preliminary and upon coder review may  be revised to meet current compliance requirements. [name], MD 05/16/2023 3:11:31 PM This report has been signed electronically. Number of Addenda: 0

## 2023-05-16 NOTE — Consult Note (Addendum)
Consultation  Referring Provider: Dr. Particia Nearing     Primary Care Physician:  Melida Quitter, MD Primary Gastroenterologist: Dr. Lavon Paganini          Reason for Consultation:    GI Bleed          HPI:   Carol Love is a 72 y.o. female with a past medical history as listed below including CAD on Aspirin, anxiety, depression, hyperlipidemia, hypertension and multiple others, who presented to the ER today with complaints of a GI bleed.    At time of presentation patient describes she felt fine yesterday but woke up this morning felt like she had to have a bowel movement, she had a very large, tarry black bowel movement.  EMS reported the amount is "significant", apparently found to be hypotensive and given IV fluids en route.    Today, patient seen with her daughter by her bedside.  Explains that she has been feeling completely normal.  She woke up around 3:00 in the morning this morning and felt like she needed to have a bowel movement, she went to the bathroom and passed a stool that was black and tarry (pictures seen at time of my interview), she then felt like she could not walk anymore and had to sit down on the bathroom floor for a period of time.  Eventually she regained her strength and got back in the bed with a towel underneath her as she was "quite a mess", apparently fell back asleep for a little while but when she woke around 7 or 8 this morning she did not have the strength to get out of bed.  That is when she called her daughters who told her to call the EMS.  They transported her here.  No further bowel movement since.  She has been up to the bedside commode to urinate, does feel a little dizzy with positional change.  No prior GI bleeds.  She does take a baby aspirin daily and has been on this since her heart attack in 2014.  No NSAIDs.    Daughter does report that her mom has been under a lot of stress taking care of her father with Alzheimer's at home.    Denies fever, chills, weight  loss, nausea, vomiting, heartburn, reflux or abdominal pain.  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 Lavon Paganini with a 2 mm polyp at the rectosigmoid colon, diverticulosis in the sigmoid colon nonbleeding internal hemorrhoids  Past Medical History:  Diagnosis Date   Abnormal nuclear stress test 12/08/2018   Anxiety    Arthritis    CAD (coronary artery disease) 08/15/2021   Cervical spondylosis 02/07/2021   Constipation    uses stool softener with stimulant   Coronary artery disease involving native coronary artery of native heart without angina pectoris 03/01/2015   Overview:  2014 ST elevation MI  Formatting of this note might be different from the original. 2014 ST elevation MI   Degenerative spondylolisthesis 03/20/2015   Depression    Disorder of bone 10/27/2014   Disorder of sacroiliac joint 12/19/2015   Encounter for general adult medical examination without abnormal findings 08/15/2021   Essential (primary) hypertension 04/26/2010   Generalized anxiety disorder  01/06/2020   Hardening of the aorta (main artery of the heart) (HCC) 03/01/2015   Overview:  2014 ST elevation MI  Formatting of this note might be different from the original. 2014 ST elevation MI   Headache    last h/a  2001   Hyperglycemia 01/06/2020   Hyperlipidemia    Irregular heart beat    hx - no problems since taking acebutolol   Myocardial infarction [facility]) 2014   stent   Nicotine dependence 01/06/2020   Overweight 01/06/2020   Psoriasis    Pulmonary emphysema (HCC) 05/15/2023   Recurrent major depression (HCC) 09/26/2021   Spinal stenosis of lumbar region 05/10/2010   Spondylolisthesis 10/27/2014   SVD (spontaneous  vaginal delivery)    x 3    Past Surgical History:  Procedure Laterality Date   APPENDECTOMY  05   CARDIAC CATHETERIZATION  2014   stent   CARDIOVASCULAR STRESS TEST  03/07/15   COLONOSCOPY     CORONARY ANGIOPLASTY     DES RCA 12/13/12   CORONARY STENT PLACEMENT     DILATION AND CURETTAGE OF UTERUS     LEFT HEART CATH AND CORONARY ANGIOGRAPHY N/A 12/10/2018   Procedure: LEFT HEART CATH AND CORONARY ANGIOGRAPHY;  Surgeon: Lyn Records, MD;  Location: [facility];  Service: Cardiovascular;  Laterality: N/A;   MAXIMUM ACCESS (MAS)POSTERIOR LUMBAR INTERBODY FUSION (PLIF) 1 LEVEL N/A 03/20/2015   Procedure: FOR MAXIMUM ACCESS (MAS) POSTERIOR LUMBAR INTERBODY FUSION (PLIF) 1 LEVEL;  Surgeon: Julio Sicks, MD;  Location: [facility];  Service: Neurosurgery;  Laterality: N/A;  FOR MAXIMUM ACCESS (MAS) POSTERIOR LUMBAR INTERBODY FUSION (PLIF) 1 LEVEL LUMBAR FOUR-FIVE   NASAL SEPTUM SURGERY  1985   s/p fall   SHOULDER ARTHROSCOPY Left 08   TONSILLECTOMY     TOOTH EXTRACTION     4 teeth pulled prior to braces as a child   WISDOM TOOTH EXTRACTION      Family History  Problem Relation Age of Onset   Heart disease Mother    Hyperlipidemia Mother    AAA (abdominal aortic aneurysm) Mother    Lung cancer Mother    Heart disease Father    Colon cancer Neg Hx    Colon polyps Neg Hx    Esophageal cancer Neg Hx    Rectal cancer Neg Hx    Stomach cancer Neg Hx     Social History   Tobacco Use   Smoking status: Former    Current packs/day: 0.00    Average packs/day: 0.5 packs/day for 45.0 years (22.5 ttl pk-yrs)    Types: Cigarettes    Start date: 10/10/1967    Quit date: 10/09/2012    Years since quitting: 10.6   Smokeless tobacco: Never  Vaping Use   Vaping status: Never Used  Substance Use Topics   Alcohol use: Yes    Alcohol/week: 4.0 - 6.0 standard drinks of alcohol    Types: 4 - 6 Glasses of wine per week    Comment: several times a week- 1-2 glasses of wine    Drug use:  No    Prior to Admission medications   Medication Sig Start Date End Date Taking? Authorizing Provider  acebutolol (SECTRAL) 200 MG capsule Take 1 capsule (200 mg total) by mouth daily. 08/23/20  Yes Revankar, Aundra Dubin, MD  alendronate (FOSAMAX) 70 MG tablet Take 70 mg by mouth once a week. 04/05/23  Yes [provider]  ALPRAZolam Prudy Feeler) 0.5 MG tablet Take 0.5 mg  by mouth in the morning. And take 1 mg in the evening 01/17/15  Yes [provider]  amitriptyline (ELAVIL) 50 MG tablet Take 50 mg by mouth at bedtime. 12/20/14  Yes [provider]  amLODipine (NORVASC) 5 MG tablet Take 1 tablet (5 mg total) by mouth daily. 02/10/23  Yes Revankar, Aundra Dubin, MD  ARIPiprazole (ABILIFY) 5 MG tablet Take 5 mg by mouth at bedtime. 05/08/23  Yes [provider]  aspirin EC 81 MG tablet Take 81 mg by mouth every evening.   Yes [provider]  atorvastatin (LIPITOR) 40 MG tablet TAKE 1 TABLET BY MOUTH DAILY IN THE EVENING 04/10/23  Yes Revankar, Aundra Dubin, MD  busPIRone (BUSPAR) 15 MG tablet Take 15 mg by mouth 2 (two) times daily. 09/25/21  Yes [provider]  Calcium Citrate-Vitamin D (CALCIUM + D PO) Take 1 tablet by mouth daily.   Yes [provider]  DULoxetine (CYMBALTA) 30 MG capsule Take 30 mg by mouth daily. Take with   Yes [provider]  DULoxetine (CYMBALTA) 60 MG capsule Take 60 mg by mouth daily. 08/17/21  Yes [provider]  meloxicam (MOBIC) 15 MG tablet Take 15 mg by mouth daily. 12/17/19  Yes [provider]  Multiple Vitamin (MULTIVITAMIN) capsule Take 1 capsule by mouth daily.   Yes [provider]  Polyethylene Glycol 3350 (MIRALAX PO) Take 1 Scoop by mouth daily as needed for constipation.   Yes [provider]  traMADol (ULTRAM) 50 MG tablet Take 50 mg by mouth every 6 (six) hours as needed for pain. Patient not taking: Reported on 05/16/2023    [provider]    Current  Facility-Administered Medications  Medication Dose Route Frequency Provider Last Rate Last Admin   0.9 %  sodium chloride infusion (Manually program via Guardrails IV Fluids)   Intravenous Once Jacalyn Lefevre, MD   Held at 05/16/23 1055   0.9 %  sodium chloride infusion   Intravenous Continuous Jacalyn Lefevre, MD       pantoprazole (PROTONIX) 80 mg /NS 100 mL IVPB  80 mg Intravenous Once Jacalyn Lefevre, MD       pantoprozole (PROTONIX) 80 mg /NS 100 mL infusion  8 mg/hr Intravenous Continuous Jacalyn Lefevre, MD       Current Outpatient Medications  Medication Sig Dispense Refill   acebutolol (SECTRAL) 200 MG capsule Take 1 capsule (200 mg total) by mouth daily. 90 capsule 3   alendronate (FOSAMAX) 70 MG tablet Take 70 mg by mouth once a week.     ALPRAZolam (XANAX) 0.5 MG tablet Take 0.5 mg by mouth in the morning. And take 1 mg in the evening  5   amitriptyline (ELAVIL) 50 MG tablet Take 50 mg by mouth at bedtime.  12   amLODipine (NORVASC) 5 MG tablet Take 1 tablet (5 mg total) by mouth daily. 30 tablet 1   ARIPiprazole (ABILIFY) 5 MG tablet Take 5 mg by mouth at bedtime.     aspirin EC 81 MG tablet Take 81 mg by mouth every evening.     atorvastatin (LIPITOR) 40 MG tablet TAKE 1 TABLET BY MOUTH DAILY IN THE EVENING 30 tablet 1   busPIRone (BUSPAR) 15 MG tablet Take 15 mg by mouth 2 (two) times daily.     Calcium Citrate-Vitamin D (CALCIUM + D PO) Take 1 tablet by mouth daily.     DULoxetine (CYMBALTA) 30 MG capsule Take 30 mg by mouth daily. Take with  DULoxetine (CYMBALTA) 60 MG capsule Take 60 mg by mouth daily.     meloxicam (MOBIC) 15 MG tablet Take 15 mg by mouth daily.     Multiple Vitamin (MULTIVITAMIN) capsule Take 1 capsule by mouth daily.     Polyethylene Glycol 3350 (MIRALAX PO) Take 1 Scoop by mouth daily as needed for constipation.     traMADol (ULTRAM) 50 MG tablet Take 50 mg by mouth every 6 (six) hours as needed for pain. (Patient not taking: Reported on 05/16/2023)       Allergies as of 05/16/2023 - Review Complete 05/16/2023  Allergen Reaction Noted   Epinephrine Other (See Comments) 11/19/2018   Ibuprofen Itching and Swelling 03/07/2015   Penicillins Rash 03/07/2015   Sulfa antibiotics Rash 03/07/2015     Review of Systems:    Constitutional: No weight loss, fever or chills Skin: No rash Cardiovascular: No chest pain Respiratory: No SOB Gastrointestinal: See HPI and otherwise negative Genitourinary: No dysuria  Neurological: +dizziness Musculoskeletal: No new muscle or joint pain Hematologic: See HPI Psychiatric: No history of depression or anxiety    Physical Exam:  Vital signs in last 24 hours: Temp:  [97.5 F (36.4 C)-97.6 F (36.4 C)] 97.5 F (36.4 C) (08/23 1227) Pulse Rate:  [45-95] 81 (08/23 1230) Resp:  [14-19] 15 (08/23 1230) BP: (96-114)/(59-78) 99/78 (08/23 1230) SpO2:  [98 %-100 %] 100 % (08/23 1230) Weight:  [54.4 kg] 54.4 kg (08/23 1018)   General:   Pleasant Caucasian female appears to be in NAD, Well developed, Well nourished, alert and cooperative Head:  Normocephalic and atraumatic. Eyes:   PEERL, EOMI. No icterus. Conjunctiva pink. Ears:  Normal auditory acuity. Neck:  Supple Throat: Oral cavity and pharynx without inflammation, swelling or lesion. Teeth in good condition. Lungs: Respirations even and unlabored. Lungs clear to auscultation bilaterally.   No wheezes, crackles, or rhonchi.  Heart: Normal S1, S2. No MRG. Regular rate and rhythm. No peripheral edema, cyanosis or pallor.  Abdomen:  Soft, nondistended, nontender. No rebound or guarding. Normal bowel sounds. No appreciable masses or hepatomegaly. Rectal:  In ED- no stool in vault, but pictures of melena and heme + Msk:  Symmetrical without gross deformities. Peripheral pulses intact.  Extremities:  Without edema, no deformity or joint abnormality.  Neurologic:  Alert and  oriented x4;  grossly normal neurologically.  Skin:   Dry and intact without  significant lesions or rashes. Psychiatric: Demonstrates good judgement and reason without abnormal affect or behaviors.   LAB RESULTS: Recent Labs    05/16/23 1022  WBC 6.9  HGB 7.3*  HCT 21.9*  PLT 153   BMET Recent Labs    05/16/23 1022  NA 135  K 4.0  CL 108  CO2 18*  GLUCOSE 130*  BUN 37*  CREATININE 0.64  CALCIUM 7.2*   LFT Recent Labs    05/16/23 1022  PROT 4.5*  ALBUMIN 2.8*  AST 20  ALT 19  ALKPHOS 26*  BILITOT 0.5   PT/INR Recent Labs    05/16/23 1022  LABPROT 17.5*  INR 1.4*    STUDIES: CT Angio Abd/Pel W and/or Wo Contrast  Result Date: 05/16/2023 CLINICAL DATA:  72 year old female with a history of lower GI bleeding EXAM: CTA ABDOMEN AND PELVIS WITHOUT AND WITH CONTRAST TECHNIQUE: Multidetector CT imaging of the abdomen and pelvis was performed using the standard protocol during bolus administration of intravenous contrast. Multiplanar reconstructed images and MIPs were obtained and reviewed to evaluate the vascular anatomy. RADIATION DOSE  REDUCTION: This exam was performed according to the departmental dose-optimization program which includes automated exposure control, adjustment of the mA and/or kV according to patient size and/or use of iterative reconstruction technique. CONTRAST:  75mL OMNIPAQUE IOHEXOL 350 MG/ML SOLN COMPARISON:  None Available. FINDINGS: VASCULAR Aorta: Mild atherosclerotic changes of the distal thoracic and abdominal aorta. No pedunculated plaque, ulcerated plaque. No dissection or aneurysm. Celiac: Celiac artery patent. Distal splenic artery aneurysm, estimated 10 mm. 50% circumferential wall calcifications. No inflammatory changes. There is no comparison available with contrast. Prior chest CT 12/18/2022, approximately 10 mm previously on the noncontrast. SMA: Patent with no significant atherosclerosis. Replaced right hepatic artery. Renals: - Right: Right renal artery patent. - Left: Left renal artery patent. IMA: Inferior  mesenteric artery is patent, though small caliber. Right lower extremity: Unremarkable course, caliber, and contour of the right iliac system. Minimal atherosclerosis. No aneurysm, dissection, or occlusion. Hypogastric artery is patent. Common femoral artery patent. Proximal SFA and profunda femoris patent. Left lower extremity: Unremarkable course, caliber, and contour of the left iliac system. Minimal atherosclerosis. No aneurysm, dissection, or occlusion. Hypogastric artery is patent. Common femoral artery patent. Proximal SFA and profunda femoris patent. Veins: Unremarkable appearance of the venous system. Review of the MIP images confirms the above findings. NON-VASCULAR Lower chest: No acute. Hepatobiliary: Unremarkable appearance of the liver. Unremarkable gall bladder. Pancreas: Unremarkable. Spleen: Unremarkable. Adrenals/Urinary Tract: - Right adrenal gland: Unremarkable - Left adrenal gland: Unremarkable. - Right kidney: No hydronephrosis, nephrolithiasis, inflammation, or ureteral dilation. No focal lesion. - Left Kidney: No hydronephrosis, nephrolithiasis, inflammation, or ureteral dilation. No focal lesion. - Urinary Bladder: Urinary bladder somewhat distended. Stomach/Bowel: - Stomach: Small hiatal hernia.  Otherwise unremarkable. - Small bowel: Unremarkable - Appendix: Normal. - Colon: Fluid-filled right colon. No accumulation of contrast on the arterial phase or the venous phase. No inflammatory changes. No wall thickening. Enhancement within normal limits. Minimal diverticular change. Lymphatic: No adenopathy. Mesenteric: No free fluid or air. No mesenteric adenopathy. Reproductive: Unremarkable appearance of the pelvic organs. Other: No hernia. Musculoskeletal: Surgical changes posterior lumbar interbody fusion with bilateral pedicle screw and rod fixation spanning L2-L5. Schmorl's node L1. No displaced fracture. IMPRESSION: CT angiogram negative for evidence of acute GI hemorrhage. 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 Aortic Atherosclerosis (ICD10-I70.0). Additional ancillary findings as above. Signed, Yvone Neu. Miachel Roux, RPVI [facility] Electronically Signed   By: Gilmer Mor D.O.   On: 05/16/2023 12:52   DG Chest 1 View  Result Date: 05/16/2023 CLINICAL DATA:  GI bleed EXAM: CHEST  1 VIEW COMPARISON:  Chest radiograph dated 12/08/2018 FINDINGS: Normal lung volumes. No focal consolidations. No pleural effusion or pneumothorax. The heart size and mediastinal contours are within normal limits. No acute osseous abnormality. IMPRESSION: No active disease. Electronically Signed   By: Agustin Cree M.D.   On: 05/16/2023 10:47      Impression / Plan:   Impression: 1.  GI bleed: Reports and pictures of dark black stool and hemoglobin drop from last known 13.7 in 2020--> 7.3 at admission--> 1 unit PRBCs ordered, CT angio negative, bleeding started this morning around 2 or 3:00 with only 1 total stool, the patient does take a baby aspirin daily, no other anticoagulation, no prior GI bleeds, no history of GERD, no abdominal pain; consider duodenal ulcer versus PUD most likely 2.  CAD: On aspirin  Plan: 1.  Patient will need EGD.  Timing per Dr. Myrtie Neither.  May try to complete  this today.  Did discuss risk and benefits, limitations and alternatives with the patient and her daughter and they agreed to proceed. 2.  Agree with Protonix infusion for now 3.  Continue to monitor hemoglobin and transfusion as needed less than 7 4.  For now patient will remain n.p.o.  Thank you for your kind consultation, we will continue to follow.  Violet Baldy [facility]  05/16/2023, 1:08 PM  I have taken an interval history, thoroughly reviewed the chart and examined the patient. I agree with the Advanced Practitioner's note, impression and recommendations, and have recorded additional findings, impressions and recommendations below. I performed a substantive  portion of this encounter (>50% time spent), including a complete performance of the medical decision making.  My additional thoughts are as follows:  72 year old woman on aspirin here with melena and acute blood loss anemia suggestive of upper GI source, ulcer most likely suspect, less likely Dieulafoy's, less likely malignancy with no chronic upper digestive symptoms. She looks quite well other than pallor, and vital signs currently normal with a blood pressure 110 over the 80s and a pulse in the 80s.  While she certainly has had acute blood loss today, I wonder if she may have an element of chronic occult blood loss from the underlying source.  She is agreeable to an upper endoscopy, and we are planning to have her come to the endoscopy lab shortly.  Procedure described in detail along with risks and benefits and she was agreeable.  The benefits and risks of the planned procedure were described in detail with the patient or (when appropriate) their health care proxy.  Risks were outlined as including, but not limited to, bleeding, infection, perforation, adverse medication reaction leading to cardiac or pulmonary decompensation, pancreatitis (if ERCP).  The limitation of incomplete mucosal visualization was also discussed.  No guarantees or warranties were given.  Patient at increased risk for cardiopulmonary complications of procedure due to medical comorbidities. (Age, severe anemia)  Patient is receiving 1 unit PRBCs for the hemoglobin of 7.3, Protonix drip is running, she has good IV access, vital signs stable.  INR 1.4, probably from clotting factor consumption due to acute bleeding.   Charlie Pitter III [phone]

## 2023-05-16 NOTE — Assessment & Plan Note (Signed)
Due to acute blood loss anemia, will hold HTN meds for now.

## 2023-05-16 NOTE — H&P (View-Only) (Signed)
Consultation  Referring Provider: Dr. Particia Nearing     Primary Care Physician:  Melida Quitter, MD Primary Gastroenterologist: Dr. Lavon Paganini          Reason for Consultation:    GI Bleed          HPI:   Carol Love is a 72 y.o. female with a past medical history as listed below including CAD on Aspirin, anxiety, depression, hyperlipidemia, hypertension and multiple others, who presented to the ER today with complaints of a GI bleed.    At time of presentation patient describes she felt fine yesterday but woke up this morning felt like she had to have a bowel movement, she had a very large, tarry black bowel movement.  EMS reported the amount is "significant", apparently found to be hypotensive and given IV fluids en route.    Today, patient seen with her daughter by her bedside.  Explains that she has been feeling completely normal.  She woke up around 3:00 in the morning this morning and felt like she needed to have a bowel movement, she went to the bathroom and passed a stool that was black and tarry (pictures seen at time of my interview), she then felt like she could not walk anymore and had to sit down on the bathroom floor for a period of time.  Eventually she regained her strength and got back in the bed with a towel underneath her as she was "quite a mess", apparently fell back asleep for a little while but when she woke around 7 or 8 this morning she did not have the strength to get out of bed.  That is when she called her daughters who told her to call the EMS.  They transported her here.  No further bowel movement since.  She has been up to the bedside commode to urinate, does feel a little dizzy with positional change.  No prior GI bleeds.  She does take a baby aspirin daily and has been on this since her heart attack in 2014.  No NSAIDs.    Daughter does report that her mom has been under a lot of stress taking care of her father with Alzheimer's at home.    Denies fever, chills, weight  loss, nausea, vomiting, heartburn, reflux or abdominal pain.  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 Lavon Paganini with a 2 mm polyp at the rectosigmoid colon, diverticulosis in the sigmoid colon nonbleeding internal hemorrhoids  Past Medical History:  Diagnosis Date   Abnormal nuclear stress test 12/08/2018   Anxiety    Arthritis    CAD (coronary artery disease) 08/15/2021   Cervical spondylosis 02/07/2021   Constipation    uses stool softener with stimulant   Coronary artery disease involving native coronary artery of native heart without angina pectoris 03/01/2015   Overview:  2014 ST elevation MI  Formatting of this note might be different from the original. 2014 ST elevation MI   Degenerative spondylolisthesis 03/20/2015   Depression    Disorder of bone 10/27/2014   Disorder of sacroiliac joint 12/19/2015   Encounter for general adult medical examination without abnormal findings 08/15/2021   Essential (primary) hypertension 04/26/2010   Generalized anxiety disorder  01/06/2020   Hardening of the aorta (main artery of the heart) (HCC) 03/01/2015   Overview:  2014 ST elevation MI  Formatting of this note might be different from the original. 2014 ST elevation MI   Headache    last h/a  2001   Hyperglycemia 01/06/2020   Hyperlipidemia    Irregular heart beat    hx - no problems since taking acebutolol   Myocardial infarction [facility]) 2014   stent   Nicotine dependence 01/06/2020   Overweight 01/06/2020   Psoriasis    Pulmonary emphysema (HCC) 05/15/2023   Recurrent major depression (HCC) 09/26/2021   Spinal stenosis of lumbar region 05/10/2010   Spondylolisthesis 10/27/2014   SVD (spontaneous  vaginal delivery)    x 3    Past Surgical History:  Procedure Laterality Date   APPENDECTOMY  05   CARDIAC CATHETERIZATION  2014   stent   CARDIOVASCULAR STRESS TEST  03/07/15   COLONOSCOPY     CORONARY ANGIOPLASTY     DES RCA 12/13/12   CORONARY STENT PLACEMENT     DILATION AND CURETTAGE OF UTERUS     LEFT HEART CATH AND CORONARY ANGIOGRAPHY N/A 12/10/2018   Procedure: LEFT HEART CATH AND CORONARY ANGIOGRAPHY;  Surgeon: Lyn Records, MD;  Location: [facility];  Service: Cardiovascular;  Laterality: N/A;   MAXIMUM ACCESS (MAS)POSTERIOR LUMBAR INTERBODY FUSION (PLIF) 1 LEVEL N/A 03/20/2015   Procedure: FOR MAXIMUM ACCESS (MAS) POSTERIOR LUMBAR INTERBODY FUSION (PLIF) 1 LEVEL;  Surgeon: Julio Sicks, MD;  Location: [facility];  Service: Neurosurgery;  Laterality: N/A;  FOR MAXIMUM ACCESS (MAS) POSTERIOR LUMBAR INTERBODY FUSION (PLIF) 1 LEVEL LUMBAR FOUR-FIVE   NASAL SEPTUM SURGERY  1985   s/p fall   SHOULDER ARTHROSCOPY Left 08   TONSILLECTOMY     TOOTH EXTRACTION     4 teeth pulled prior to braces as a child   WISDOM TOOTH EXTRACTION      Family History  Problem Relation Age of Onset   Heart disease Mother    Hyperlipidemia Mother    AAA (abdominal aortic aneurysm) Mother    Lung cancer Mother    Heart disease Father    Colon cancer Neg Hx    Colon polyps Neg Hx    Esophageal cancer Neg Hx    Rectal cancer Neg Hx    Stomach cancer Neg Hx     Social History   Tobacco Use   Smoking status: Former    Current packs/day: 0.00    Average packs/day: 0.5 packs/day for 45.0 years (22.5 ttl pk-yrs)    Types: Cigarettes    Start date: 10/10/1967    Quit date: 10/09/2012    Years since quitting: 10.6   Smokeless tobacco: Never  Vaping Use   Vaping status: Never Used  Substance Use Topics   Alcohol use: Yes    Alcohol/week: 4.0 - 6.0 standard drinks of alcohol    Types: 4 - 6 Glasses of wine per week    Comment: several times a week- 1-2 glasses of wine    Drug use:  No    Prior to Admission medications   Medication Sig Start Date End Date Taking? Authorizing Provider  acebutolol (SECTRAL) 200 MG capsule Take 1 capsule (200 mg total) by mouth daily. 08/23/20  Yes Revankar, Aundra Dubin, MD  alendronate (FOSAMAX) 70 MG tablet Take 70 mg by mouth once a week. 04/05/23  Yes [provider]  ALPRAZolam Prudy Feeler) 0.5 MG tablet Take 0.5 mg  by mouth in the morning. And take 1 mg in the evening 01/17/15  Yes [provider]  amitriptyline (ELAVIL) 50 MG tablet Take 50 mg by mouth at bedtime. 12/20/14  Yes [provider]  amLODipine (NORVASC) 5 MG tablet Take 1 tablet (5 mg total) by mouth daily. 02/10/23  Yes Revankar, Aundra Dubin, MD  ARIPiprazole (ABILIFY) 5 MG tablet Take 5 mg by mouth at bedtime. 05/08/23  Yes [provider]  aspirin EC 81 MG tablet Take 81 mg by mouth every evening.   Yes [provider]  atorvastatin (LIPITOR) 40 MG tablet TAKE 1 TABLET BY MOUTH DAILY IN THE EVENING 04/10/23  Yes Revankar, Aundra Dubin, MD  busPIRone (BUSPAR) 15 MG tablet Take 15 mg by mouth 2 (two) times daily. 09/25/21  Yes [provider]  Calcium Citrate-Vitamin D (CALCIUM + D PO) Take 1 tablet by mouth daily.   Yes [provider]  DULoxetine (CYMBALTA) 30 MG capsule Take 30 mg by mouth daily. Take with   Yes [provider]  DULoxetine (CYMBALTA) 60 MG capsule Take 60 mg by mouth daily. 08/17/21  Yes [provider]  meloxicam (MOBIC) 15 MG tablet Take 15 mg by mouth daily. 12/17/19  Yes [provider]  Multiple Vitamin (MULTIVITAMIN) capsule Take 1 capsule by mouth daily.   Yes [provider]  Polyethylene Glycol 3350 (MIRALAX PO) Take 1 Scoop by mouth daily as needed for constipation.   Yes [provider]  traMADol (ULTRAM) 50 MG tablet Take 50 mg by mouth every 6 (six) hours as needed for pain. Patient not taking: Reported on 05/16/2023    [provider]    Current  Facility-Administered Medications  Medication Dose Route Frequency Provider Last Rate Last Admin   0.9 %  sodium chloride infusion (Manually program via Guardrails IV Fluids)   Intravenous Once Jacalyn Lefevre, MD   Held at 05/16/23 1055   0.9 %  sodium chloride infusion   Intravenous Continuous Jacalyn Lefevre, MD       pantoprazole (PROTONIX) 80 mg /NS 100 mL IVPB  80 mg Intravenous Once Jacalyn Lefevre, MD       pantoprozole (PROTONIX) 80 mg /NS 100 mL infusion  8 mg/hr Intravenous Continuous Jacalyn Lefevre, MD       Current Outpatient Medications  Medication Sig Dispense Refill   acebutolol (SECTRAL) 200 MG capsule Take 1 capsule (200 mg total) by mouth daily. 90 capsule 3   alendronate (FOSAMAX) 70 MG tablet Take 70 mg by mouth once a week.     ALPRAZolam (XANAX) 0.5 MG tablet Take 0.5 mg by mouth in the morning. And take 1 mg in the evening  5   amitriptyline (ELAVIL) 50 MG tablet Take 50 mg by mouth at bedtime.  12   amLODipine (NORVASC) 5 MG tablet Take 1 tablet (5 mg total) by mouth daily. 30 tablet 1   ARIPiprazole (ABILIFY) 5 MG tablet Take 5 mg by mouth at bedtime.     aspirin EC 81 MG tablet Take 81 mg by mouth every evening.     atorvastatin (LIPITOR) 40 MG tablet TAKE 1 TABLET BY MOUTH DAILY IN THE EVENING 30 tablet 1   busPIRone (BUSPAR) 15 MG tablet Take 15 mg by mouth 2 (two) times daily.     Calcium Citrate-Vitamin D (CALCIUM + D PO) Take 1 tablet by mouth daily.     DULoxetine (CYMBALTA) 30 MG capsule Take 30 mg by mouth daily. Take with  DULoxetine (CYMBALTA) 60 MG capsule Take 60 mg by mouth daily.     meloxicam (MOBIC) 15 MG tablet Take 15 mg by mouth daily.     Multiple Vitamin (MULTIVITAMIN) capsule Take 1 capsule by mouth daily.     Polyethylene Glycol 3350 (MIRALAX PO) Take 1 Scoop by mouth daily as needed for constipation.     traMADol (ULTRAM) 50 MG tablet Take 50 mg by mouth every 6 (six) hours as needed for pain. (Patient not taking: Reported on 05/16/2023)       Allergies as of 05/16/2023 - Review Complete 05/16/2023  Allergen Reaction Noted   Epinephrine Other (See Comments) 11/19/2018   Ibuprofen Itching and Swelling 03/07/2015   Penicillins Rash 03/07/2015   Sulfa antibiotics Rash 03/07/2015     Review of Systems:    Constitutional: No weight loss, fever or chills Skin: No rash Cardiovascular: No chest pain Respiratory: No SOB Gastrointestinal: See HPI and otherwise negative Genitourinary: No dysuria  Neurological: +dizziness Musculoskeletal: No new muscle or joint pain Hematologic: See HPI Psychiatric: No history of depression or anxiety    Physical Exam:  Vital signs in last 24 hours: Temp:  [97.5 F (36.4 C)-97.6 F (36.4 C)] 97.5 F (36.4 C) (08/23 1227) Pulse Rate:  [45-95] 81 (08/23 1230) Resp:  [14-19] 15 (08/23 1230) BP: (96-114)/(59-78) 99/78 (08/23 1230) SpO2:  [98 %-100 %] 100 % (08/23 1230) Weight:  [54.4 kg] 54.4 kg (08/23 1018)   General:   Pleasant Caucasian female appears to be in NAD, Well developed, Well nourished, alert and cooperative Head:  Normocephalic and atraumatic. Eyes:   PEERL, EOMI. No icterus. Conjunctiva pink. Ears:  Normal auditory acuity. Neck:  Supple Throat: Oral cavity and pharynx without inflammation, swelling or lesion. Teeth in good condition. Lungs: Respirations even and unlabored. Lungs clear to auscultation bilaterally.   No wheezes, crackles, or rhonchi.  Heart: Normal S1, S2. No MRG. Regular rate and rhythm. No peripheral edema, cyanosis or pallor.  Abdomen:  Soft, nondistended, nontender. No rebound or guarding. Normal bowel sounds. No appreciable masses or hepatomegaly. Rectal:  In ED- no stool in vault, but pictures of melena and heme + Msk:  Symmetrical without gross deformities. Peripheral pulses intact.  Extremities:  Without edema, no deformity or joint abnormality.  Neurologic:  Alert and  oriented x4;  grossly normal neurologically.  Skin:   Dry and intact without  significant lesions or rashes. Psychiatric: Demonstrates good judgement and reason without abnormal affect or behaviors.   LAB RESULTS: Recent Labs    05/16/23 1022  WBC 6.9  HGB 7.3*  HCT 21.9*  PLT 153   BMET Recent Labs    05/16/23 1022  NA 135  K 4.0  CL 108  CO2 18*  GLUCOSE 130*  BUN 37*  CREATININE 0.64  CALCIUM 7.2*   LFT Recent Labs    05/16/23 1022  PROT 4.5*  ALBUMIN 2.8*  AST 20  ALT 19  ALKPHOS 26*  BILITOT 0.5   PT/INR Recent Labs    05/16/23 1022  LABPROT 17.5*  INR 1.4*    STUDIES: CT Angio Abd/Pel W and/or Wo Contrast  Result Date: 05/16/2023 CLINICAL DATA:  72 year old female with a history of lower GI bleeding EXAM: CTA ABDOMEN AND PELVIS WITHOUT AND WITH CONTRAST TECHNIQUE: Multidetector CT imaging of the abdomen and pelvis was performed using the standard protocol during bolus administration of intravenous contrast. Multiplanar reconstructed images and MIPs were obtained and reviewed to evaluate the vascular anatomy. RADIATION DOSE  REDUCTION: This exam was performed according to the departmental dose-optimization program which includes automated exposure control, adjustment of the mA and/or kV according to patient size and/or use of iterative reconstruction technique. CONTRAST:  75mL OMNIPAQUE IOHEXOL 350 MG/ML SOLN COMPARISON:  None Available. FINDINGS: VASCULAR Aorta: Mild atherosclerotic changes of the distal thoracic and abdominal aorta. No pedunculated plaque, ulcerated plaque. No dissection or aneurysm. Celiac: Celiac artery patent. Distal splenic artery aneurysm, estimated 10 mm. 50% circumferential wall calcifications. No inflammatory changes. There is no comparison available with contrast. Prior chest CT 12/18/2022, approximately 10 mm previously on the noncontrast. SMA: Patent with no significant atherosclerosis. Replaced right hepatic artery. Renals: - Right: Right renal artery patent. - Left: Left renal artery patent. IMA: Inferior  mesenteric artery is patent, though small caliber. Right lower extremity: Unremarkable course, caliber, and contour of the right iliac system. Minimal atherosclerosis. No aneurysm, dissection, or occlusion. Hypogastric artery is patent. Common femoral artery patent. Proximal SFA and profunda femoris patent. Left lower extremity: Unremarkable course, caliber, and contour of the left iliac system. Minimal atherosclerosis. No aneurysm, dissection, or occlusion. Hypogastric artery is patent. Common femoral artery patent. Proximal SFA and profunda femoris patent. Veins: Unremarkable appearance of the venous system. Review of the MIP images confirms the above findings. NON-VASCULAR Lower chest: No acute. Hepatobiliary: Unremarkable appearance of the liver. Unremarkable gall bladder. Pancreas: Unremarkable. Spleen: Unremarkable. Adrenals/Urinary Tract: - Right adrenal gland: Unremarkable - Left adrenal gland: Unremarkable. - Right kidney: No hydronephrosis, nephrolithiasis, inflammation, or ureteral dilation. No focal lesion. - Left Kidney: No hydronephrosis, nephrolithiasis, inflammation, or ureteral dilation. No focal lesion. - Urinary Bladder: Urinary bladder somewhat distended. Stomach/Bowel: - Stomach: Small hiatal hernia.  Otherwise unremarkable. - Small bowel: Unremarkable - Appendix: Normal. - Colon: Fluid-filled right colon. No accumulation of contrast on the arterial phase or the venous phase. No inflammatory changes. No wall thickening. Enhancement within normal limits. Minimal diverticular change. Lymphatic: No adenopathy. Mesenteric: No free fluid or air. No mesenteric adenopathy. Reproductive: Unremarkable appearance of the pelvic organs. Other: No hernia. Musculoskeletal: Surgical changes posterior lumbar interbody fusion with bilateral pedicle screw and rod fixation spanning L2-L5. Schmorl's node L1. No displaced fracture. IMPRESSION: CT angiogram negative for evidence of acute GI hemorrhage. 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 Aortic Atherosclerosis (ICD10-I70.0). Additional ancillary findings as above. Signed, Yvone Neu. Miachel Roux, RPVI [facility] Electronically Signed   By: Gilmer Mor D.O.   On: 05/16/2023 12:52   DG Chest 1 View  Result Date: 05/16/2023 CLINICAL DATA:  GI bleed EXAM: CHEST  1 VIEW COMPARISON:  Chest radiograph dated 12/08/2018 FINDINGS: Normal lung volumes. No focal consolidations. No pleural effusion or pneumothorax. The heart size and mediastinal contours are within normal limits. No acute osseous abnormality. IMPRESSION: No active disease. Electronically Signed   By: Agustin Cree M.D.   On: 05/16/2023 10:47      Impression / Plan:   Impression: 1.  GI bleed: Reports and pictures of dark black stool and hemoglobin drop from last known 13.7 in 2020--> 7.3 at admission--> 1 unit PRBCs ordered, CT angio negative, bleeding started this morning around 2 or 3:00 with only 1 total stool, the patient does take a baby aspirin daily, no other anticoagulation, no prior GI bleeds, no history of GERD, no abdominal pain; consider duodenal ulcer versus PUD most likely 2.  CAD: On aspirin  Plan: 1.  Patient will need EGD.  Timing per Dr. Myrtie Neither.  May try to complete  this today.  Did discuss risk and benefits, limitations and alternatives with the patient and her daughter and they agreed to proceed. 2.  Agree with Protonix infusion for now 3.  Continue to monitor hemoglobin and transfusion as needed less than 7 4.  For now patient will remain n.p.o.  Thank you for your kind consultation, we will continue to follow.  Violet Baldy [facility]  05/16/2023, 1:08 PM  I have taken an interval history, thoroughly reviewed the chart and examined the patient. I agree with the Advanced Practitioner's note, impression and recommendations, and have recorded additional findings, impressions and recommendations below. I performed a substantive  portion of this encounter (>50% time spent), including a complete performance of the medical decision making.  My additional thoughts are as follows:  72 year old woman on aspirin here with melena and acute blood loss anemia suggestive of upper GI source, ulcer most likely suspect, less likely Dieulafoy's, less likely malignancy with no chronic upper digestive symptoms. She looks quite well other than pallor, and vital signs currently normal with a blood pressure 110 over the 80s and a pulse in the 80s.  While she certainly has had acute blood loss today, I wonder if she may have an element of chronic occult blood loss from the underlying source.  She is agreeable to an upper endoscopy, and we are planning to have her come to the endoscopy lab shortly.  Procedure described in detail along with risks and benefits and she was agreeable.  The benefits and risks of the planned procedure were described in detail with the patient or (when appropriate) their health care proxy.  Risks were outlined as including, but not limited to, bleeding, infection, perforation, adverse medication reaction leading to cardiac or pulmonary decompensation, pancreatitis (if ERCP).  The limitation of incomplete mucosal visualization was also discussed.  No guarantees or warranties were given.  Patient at increased risk for cardiopulmonary complications of procedure due to medical comorbidities. (Age, severe anemia)  Patient is receiving 1 unit PRBCs for the hemoglobin of 7.3, Protonix drip is running, she has good IV access, vital signs stable.  INR 1.4, probably from clotting factor consumption due to acute bleeding.   Carol Love III [phone]

## 2023-05-16 NOTE — ED Notes (Signed)
ED TO INPATIENT HANDOFF REPORT  ED Nurse Name and Phone #:  Naydene Kamrowski [phone]  S Name/Age/Gender Carol Love 72 y.o. female Room/Bed: 031C/031C  Code Status   Code Status: Prior  Home/SNF/Other Home Patient oriented to: self, place, time, and situation Is this baseline? Yes   Triage Complete: Triage complete  Chief Complaint GI bleeding [K92.2]  Triage Note Pt BIB [facility] from home due to GI bleed.  Pt went to bathroom and there was black tarry stool in toilet.  EMS states it was "significant."  NRB in EMS.  20g right AC.  NS given en route.  VS BP 100/80   Allergies Allergies  Allergen Reactions   Epinephrine Other (See Comments)    Gets migraines  Pt cannot recall this allergy.   Ibuprofen Itching and Swelling   Penicillins Rash    Did it involve swelling of the face/tongue/throat, SOB, or low BP? Unknown Did it involve sudden or severe rash/hives, skin peeling, or any reaction on the inside of your mouth or nose? No Did you need to seek medical attention at a hospital or doctor's office? Unknown When did it last happen?      Childhood allergy If all above answers are "NO", may proceed with cephalosporin use.    Sulfa Antibiotics Rash    Pt states this is a childhood allergy    Level of Care/Admitting Diagnosis ED Disposition     ED Disposition  Admit   Condition  --   Comment  H[facility]: [facility] [[id number]]  Level of Care: Telemetry Medical [[id number]]  May place patient in observation at [facility] or [facility] if equivalent level of care is available:: No  Covid Evaluation: Asymptomatic - no recent exposure (last 10 days) testing not required  Diagnosis: GI bleeding [413244]  Admitting Physician: Imogene Burn, ERIC [[id number]]  Attending Physician: Imogene Burn, ERIC [[id number]]          B Medical/Surgery History Past Medical History:  Diagnosis Date   Abnormal nuclear stress test 12/08/2018   Anxiety    Arthritis    CAD (coronary artery  disease) 08/15/2021   Cervical spondylosis 02/07/2021   Constipation    uses stool softener with stimulant   Coronary artery disease involving native coronary artery of native heart without angina pectoris 03/01/2015   Overview:  2014 ST elevation MI  Formatting of this note might be different from the original. 2014 ST elevation MI   Degenerative spondylolisthesis 03/20/2015   Depression    Disorder of bone 10/27/2014   Disorder of sacroiliac joint 12/19/2015   Encounter for general adult medical examination without abnormal findings 08/15/2021   Essential (primary) hypertension 04/26/2010   Generalized anxiety disorder 01/06/2020   Hardening of the aorta (main artery of the heart) (HCC) 03/01/2015   Overview:  2014 ST elevation MI  Formatting of this note might be different from the original. 2014 ST elevation MI   Headache    last h/a  2001   Hyperglycemia 01/06/2020   Hyperlipidemia    Irregular heart beat    hx - no problems since taking acebutolol   Myocardial infarction [facility]) 2014   stent   Nicotine dependence 01/06/2020   Overweight 01/06/2020   Psoriasis    Pulmonary emphysema (HCC) 05/15/2023   Recurrent major depression (HCC) 09/26/2021   Spinal stenosis of lumbar region 05/10/2010   Spondylolisthesis 10/27/2014   SVD (spontaneous vaginal delivery)    x 3   Past Surgical History:  Procedure  Laterality Date   APPENDECTOMY  05   CARDIAC CATHETERIZATION  2014   stent   CARDIOVASCULAR STRESS TEST  03/07/15   COLONOSCOPY     CORONARY ANGIOPLASTY     DES RCA 12/13/12   CORONARY STENT PLACEMENT     DILATION AND CURETTAGE OF UTERUS     LEFT HEART CATH AND CORONARY ANGIOGRAPHY N/A 12/10/2018   Procedure: LEFT HEART CATH AND CORONARY ANGIOGRAPHY;  Surgeon: Lyn Records, MD;  Location: [facility] INVASIVE CV LAB;  Service: Cardiovascular;  Laterality: N/A;   MAXIMUM ACCESS (MAS)POSTERIOR LUMBAR INTERBODY FUSION (PLIF) 1 LEVEL N/A 03/20/2015   Procedure: FOR MAXIMUM ACCESS (MAS)  POSTERIOR LUMBAR INTERBODY FUSION (PLIF) 1 LEVEL;  Surgeon: Julio Sicks, MD;  Location: [facility] NEURO ORS;  Service: Neurosurgery;  Laterality: N/A;  FOR MAXIMUM ACCESS (MAS) POSTERIOR LUMBAR INTERBODY FUSION (PLIF) 1 LEVEL LUMBAR FOUR-FIVE   NASAL SEPTUM SURGERY  1985   s/p fall   SHOULDER ARTHROSCOPY Left 08   TONSILLECTOMY     TOOTH EXTRACTION     4 teeth pulled prior to braces as a child   WISDOM TOOTH EXTRACTION       A IV Location/Drains/Wounds Patient Lines/Drains/Airways Status     Active Line/Drains/Airways     Name Placement date Placement time Site Days   Peripheral IV 05/16/23 20 G Left Antecubital 05/16/23  1037  Antecubital  less than 1   Peripheral IV 05/16/23 20 G Right Antecubital 05/16/23  1219  Antecubital  less than 1            Intake/Output Last 24 hours No intake or output data in the 24 hours ending 05/16/23 1344  Labs/Imaging Results for orders placed or performed during the hospital encounter of 05/16/23 (from the past 48 hour(s))  Type and screen [facility]     Status: None (Preliminary result)   Collection Time: 05/16/23 10:21 AM  Result Value Ref Range   ABO/RH(D) O POS    Antibody Screen NEG    Sample Expiration 05/19/2023,2359    Unit Number [id number]    Blood Component Type RED CELLS,LR    Unit division 00    Status of Unit ISSUED    Transfusion Status OK TO TRANSFUSE    Crossmatch Result      Compatible Performed at [facility], [address], [place], [place] [address]   Comprehensive metabolic panel     Status: Abnormal   Collection Time: 05/16/23 10:22 AM  Result Value Ref Range   Sodium 135 135 - 145 mmol/L   Potassium 4.0 3.5 - 5.1 mmol/L   Chloride 108 98 - 111 mmol/L   CO2 18 (L) 22 - 32 mmol/L   Glucose, Bld 130 (H) 70 - 99 mg/dL    Comment: Glucose reference range applies only to samples taken after fasting for at least 8 hours.   BUN 37 (H) 8 - 23 mg/dL   Creatinine, Ser 7.82 0.44 - 1.00 mg/dL    Calcium 7.2 (L) 8.9 - 10.3 mg/dL   Total Protein 4.5 (L) 6.5 - 8.1 g/dL   Albumin 2.8 (L) 3.5 - 5.0 g/dL   AST 20 15 - 41 U/L   ALT 19 0 - 44 U/L   Alkaline Phosphatase 26 (L) 38 - 126 U/L   Total Bilirubin 0.5 0.3 - 1.2 mg/dL   GFR, Estimated >95 >62 mL/min    Comment: (NOTE) Calculated using the CKD-EPI Creatinine Equation (2021)    Anion gap  9 5 - 15    Comment: Performed at [facility], [address] [place], [place] [address]  CBC with Differential     Status: Abnormal   Collection Time: 05/16/23 10:22 AM  Result Value Ref Range   WBC 6.9 4.0 - 10.5 K/uL   RBC 2.13 (L) 3.87 - 5.11 MIL/uL   Hemoglobin 7.3 (L) 12.0 - 15.0 g/dL   HCT 95.6 (L) 38.7 - 56.4 %   MCV 102.8 (H) 80.0 - 100.0 fL   MCH 34.3 (H) 26.0 - 34.0 pg   MCHC 33.3 30.0 - 36.0 g/dL   RDW 33.2 95.1 - 88.4 %   Platelets 153 150 - 400 K/uL   nRBC 0.0 0.0 - 0.2 %   Neutrophils Relative % 79 %   Neutro Abs 5.4 1.7 - 7.7 K/uL   Lymphocytes Relative 15 %   Lymphs Abs 1.0 0.7 - 4.0 K/uL   Monocytes Relative 6 %   Monocytes Absolute 0.4 0.1 - 1.0 K/uL   Eosinophils Relative 0 %   Eosinophils Absolute 0.0 0.0 - 0.5 K/uL   Basophils Relative 0 %   Basophils Absolute 0.0 0.0 - 0.1 K/uL   Immature Granulocytes 0 %   Abs Immature Granulocytes 0.03 0.00 - 0.07 K/uL    Comment: Performed at [facility], [address], [place], [place] [address]  Protime-INR     Status: Abnormal   Collection Time: 05/16/23 10:22 AM  Result Value Ref Range   Prothrombin Time 17.5 (H) 11.4 - 15.2 seconds   INR 1.4 (H) 0.8 - 1.2    Comment: (NOTE) INR goal varies based on device and disease states. Performed at [facility], [address], [place], [place] [address]   POC occult blood, ED Provider will collect     Status: Abnormal   Collection Time: 05/16/23 10:24 AM  Result Value Ref Range   Fecal Occult Bld POSITIVE (A) NEGATIVE  Prepare RBC (crossmatch)     Status: None   Collection Time: 05/16/23 10:50  AM  Result Value Ref Range   Order Confirmation      ORDER PROCESSED BY BLOOD BANK Performed at [facility], [address], [place], [place] [address]    CT Angio Abd/Pel W and/or Wo Contrast  Result Date: 05/16/2023 CLINICAL DATA:  72 year old female with a history of lower GI bleeding EXAM: CTA ABDOMEN AND PELVIS WITHOUT AND WITH CONTRAST TECHNIQUE: Multidetector CT imaging of the abdomen and pelvis was performed using the standard protocol during bolus administration of intravenous contrast. Multiplanar reconstructed images and MIPs were obtained and reviewed to evaluate the vascular anatomy. RADIATION DOSE REDUCTION: This exam was performed according to the departmental dose-optimization program which includes automated exposure control, adjustment of the mA and/or kV according to patient size and/or use of iterative reconstruction technique. CONTRAST:  75mL OMNIPAQUE IOHEXOL 350 MG/ML SOLN COMPARISON:  None Available. FINDINGS: VASCULAR Aorta: Mild atherosclerotic changes of the distal thoracic and abdominal aorta. No pedunculated plaque, ulcerated plaque. No dissection or aneurysm. Celiac: Celiac artery patent. Distal splenic artery aneurysm, estimated 10 mm. 50% circumferential wall calcifications. No inflammatory changes. There is no comparison available with contrast. Prior chest CT 12/18/2022, approximately 10 mm previously on the noncontrast. SMA: Patent with no significant atherosclerosis. Replaced right hepatic artery. Renals: - Right: Right renal artery patent. - Left: Left renal artery patent. IMA: Inferior mesenteric artery is patent, though small caliber. Right lower extremity: Unremarkable course, caliber, and  contour of the right iliac system. Minimal atherosclerosis. No aneurysm, dissection, or occlusion. Hypogastric artery is patent. Common femoral artery patent. Proximal SFA and profunda femoris patent. Left lower extremity: Unremarkable course, caliber, and contour of the left  iliac system. Minimal atherosclerosis. No aneurysm, dissection, or occlusion. Hypogastric artery is patent. Common femoral artery patent. Proximal SFA and profunda femoris patent. Veins: Unremarkable appearance of the venous system. Review of the MIP images confirms the above findings. NON-VASCULAR Lower chest: No acute. Hepatobiliary: Unremarkable appearance of the liver. Unremarkable gall bladder. Pancreas: Unremarkable. Spleen: Unremarkable. Adrenals/Urinary Tract: - Right adrenal gland: Unremarkable - Left adrenal gland: Unremarkable. - Right kidney: No hydronephrosis, nephrolithiasis, inflammation, or ureteral dilation. No focal lesion. - Left Kidney: No hydronephrosis, nephrolithiasis, inflammation, or ureteral dilation. No focal lesion. - Urinary Bladder: Urinary bladder somewhat distended. Stomach/Bowel: - Stomach: Small hiatal hernia.  Otherwise unremarkable. - Small bowel: Unremarkable - Appendix: Normal. - Colon: Fluid-filled right colon. No accumulation of contrast on the arterial phase or the venous phase. No inflammatory changes. No wall thickening. Enhancement within normal limits. Minimal diverticular change. Lymphatic: No adenopathy. Mesenteric: No free fluid or air. No mesenteric adenopathy. Reproductive: Unremarkable appearance of the pelvic organs. Other: No hernia. Musculoskeletal: Surgical changes posterior lumbar interbody fusion with bilateral pedicle screw and rod fixation spanning L2-L5. Schmorl's node L1. No displaced fracture. IMPRESSION: CT angiogram negative for evidence of acute GI hemorrhage. 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 Aortic Atherosclerosis (ICD10-I70.0). Additional ancillary findings as above. Signed, Yvone Neu. Miachel Roux, RPVI [facility] Electronically Signed   By: Gilmer Mor D.O.   On: 05/16/2023 12:52   DG Chest 1 View  Result Date: 05/16/2023 CLINICAL DATA:  GI bleed EXAM: CHEST  1 VIEW COMPARISON:   Chest radiograph dated 12/08/2018 FINDINGS: Normal lung volumes. No focal consolidations. No pleural effusion or pneumothorax. The heart size and mediastinal contours are within normal limits. No acute osseous abnormality. IMPRESSION: No active disease. Electronically Signed   By: Agustin Cree M.D.   On: 05/16/2023 10:47    Pending Labs Unresulted Labs (From admission, onward)     Start     Ordered   05/17/23 0500  CBC  Tomorrow morning,   R        05/16/23 1337   05/17/23 0500  Comprehensive metabolic panel  Tomorrow morning,   R        05/16/23 1337   05/16/23 2000  Hemoglobin and hematocrit, blood  Once-Timed,   TIMED        05/16/23 1339            Vitals/Pain Today's Vitals   05/16/23 1230 05/16/23 1300 05/16/23 1315 05/16/23 1330  BP: 99/78 111/75 113/73 103/76  Pulse: 81 83 79 83  Resp: 15 18 14 16   Temp:      TempSrc:      SpO2: 100% 100% 100% 100%  Weight:      Height:      PainSc:        Isolation Precautions No active isolations  Medications Medications  sodium chloride 0.9 % bolus 1,000 mL (0 mLs Intravenous Stopped 05/16/23 1108)    And  0.9 %  sodium chloride infusion ( Intravenous New Bag/Given 05/16/23 1336)  0.9 %  sodium chloride infusion (Manually program via Guardrails IV Fluids) (0 mLs Intravenous Hold 05/16/23 1055)  pantoprazole (PROTONIX) 80 mg /NS 100 mL IVPB (80 mg Intravenous New Bag/Given 05/16/23 1338)  pantoprozole (PROTONIX) 80 mg /  NS 100 mL infusion (has no administration in time range)  iohexol (OMNIPAQUE) 350 MG/ML injection 75 mL (75 mLs Intravenous Contrast Given 05/16/23 1202)    Mobility walks     Focused Assessments Neuro Assessment Handoff:           Neuro Assessment: Within Defined Limits  If patient is a Neuro Trauma and patient is going to OR before floor call report to 4N Charge nurse: [phone] or [phone]   R Recommendations: See Admitting Provider Note  Report given to:   Additional Notes:

## 2023-05-16 NOTE — Assessment & Plan Note (Signed)
Pt is ethnic [place] and drinks 3 glasses of wine per day. She states she has does this for a number of decades. Has never had difficulty with alcohol use. Has never had trouble with stopping alcohol use.

## 2023-05-16 NOTE — ED Notes (Addendum)
Patient with no suspected reaction within first 15 minutes of blood administration. Rate change from 136mL/hr to 125mL/hr.

## 2023-05-16 NOTE — Assessment & Plan Note (Signed)
Stable. Continue lipitor. 

## 2023-05-16 NOTE — ED Triage Notes (Signed)
Pt BIB [facility] from home due to GI bleed.  Pt went to bathroom and there was black tarry stool in toilet.  EMS states it was "significant."  NRB in EMS.  20g right AC.  NS given en route.  VS BP 100/80

## 2023-05-16 NOTE — Anesthesia Preprocedure Evaluation (Signed)
Anesthesia Evaluation  Patient identified by MRN, date of birth, ID band Patient awake    Reviewed: Allergy & Precautions, NPO status , Patient's Chart, lab work & pertinent test results  Airway Mallampati: III  TM Distance: >3 FB Neck ROM: Full   Comment: Previous grade II view with MAC 3, easy mask Dental  (+) Dental Advisory Given   Pulmonary COPD, former smoker   breath sounds clear to auscultation       Cardiovascular hypertension, Pt. on medications + CAD and + Past MI  + dysrhythmias (prolonged QT)  Rhythm:Regular  HLD  LHC 12/10/2018:  Widely patent left main coronary artery  Proximal eccentric 60 to 70% LAD with TIMI grade III flow and no indication for treatment.  30% proximal to mid circumflex.  Located within a region of calcification.  Widely patent proximal to mid RCA stent  Normal LV systolic function EF 65% and LVEDP 18 mmHg.    Neuro/Psych  PSYCHIATRIC DISORDERS Anxiety Depression     Neuromuscular disease (spondylolisthesis)    GI/Hepatic   Endo/Other    Renal/GU      Musculoskeletal  (+) Arthritis ,    Abdominal   Peds  Hematology  (+) Blood dyscrasia, anemia Lab Results      Component                Value               Date                      WBC                      6.9                 05/16/2023                HGB                      7.3 (L)             05/16/2023                HCT                      21.9 (L)            05/16/2023                MCV                      102.8 (H)           05/16/2023                PLT                      153                 05/16/2023              Anesthesia Other Findings 72 year old Caucasian female history of hypertension, coronary disease, hyperlipidemia, spinal stenosis who presents to the ER today with sudden onset of melanotic stools.  Reproductive/Obstetrics                             Anesthesia  Physical Anesthesia Plan  ASA: 3  Anesthesia Plan: MAC  Post-op Pain Management: Minimal or no pain anticipated   Induction: Intravenous  PONV Risk Score and Plan: 2 and Treatment may vary due to age or medical condition  Airway Management Planned: Natural Airway, Nasal Cannula and Simple Face Mask  Additional Equipment: None  Intra-op Plan:   Post-operative Plan:   Informed Consent: I have reviewed the patients History and Physical, chart, labs and discussed the procedure including the risks, benefits and alternatives for the proposed anesthesia with the patient or authorized representative who has indicated his/her understanding and acceptance.     Dental advisory given  Plan Discussed with: CRNA  Anesthesia Plan Comments:        Anesthesia Quick Evaluation

## 2023-05-16 NOTE — Assessment & Plan Note (Signed)
Will hold ASA.

## 2023-05-16 NOTE — ED Notes (Signed)
Patient transported to CT 

## 2023-05-16 NOTE — Assessment & Plan Note (Signed)
Pt takes daily Mobic 15 mg for her back. She takes it with food in the AM with breakfast. Does not take H2 blocker or PPI on a regular basis.

## 2023-05-16 NOTE — ED Provider Notes (Signed)
[facility] AT [facility] Provider Note   CSN: [id number] Arrival date & time: 05/16/23  1014     History  No chief complaint on file.   Carol Love is a 72 y.o. female.  Pt is a 72 yo female with pmhx significant for CAD, anxiety, depression, hld, htn, and cad.  Pt felt fine yesterday, but woke up this am and felt like she had to have a bowel movement.  She had a very large, tarry bowel movement.  EMS reports the amount was "significant."  Pt was hypotensive and was given IVFs en route.  Pt denies any pain.  No hx gib.  Pt said she's never had a colonoscopy and does not have a gi doctor.  Pt is not on blood thinners, but does take asa 81 mg daily and meloxicam prn.       Home Medications Prior to Admission medications   Medication Sig Start Date End Date Taking? Authorizing Provider  acebutolol (SECTRAL) 200 MG capsule Take 1 capsule (200 mg total) by mouth daily. 08/23/20  Yes Revankar, Aundra Dubin, MD  alendronate (FOSAMAX) 70 MG tablet Take 70 mg by mouth once a week. 04/05/23  Yes [provider]  ALPRAZolam Prudy Feeler) 0.5 MG tablet Take 0.5 mg by mouth in the morning. And take 1 mg in the evening 01/17/15  Yes [provider]  amitriptyline (ELAVIL) 50 MG tablet Take 50 mg by mouth at bedtime. 12/20/14  Yes [provider]  amLODipine (NORVASC) 5 MG tablet Take 1 tablet (5 mg total) by mouth daily. 02/10/23  Yes Revankar, Aundra Dubin, MD  ARIPiprazole (ABILIFY) 5 MG tablet Take 5 mg by mouth at bedtime. 05/08/23  Yes [provider]  aspirin EC 81 MG tablet Take 81 mg by mouth every evening.   Yes [provider]  atorvastatin (LIPITOR) 40 MG tablet TAKE 1 TABLET BY MOUTH DAILY IN THE EVENING 04/10/23  Yes Revankar, Aundra Dubin, MD  busPIRone (BUSPAR) 15 MG tablet Take 15 mg by mouth 2 (two) times daily. 09/25/21  Yes [provider]  Calcium Citrate-Vitamin D (CALCIUM + D PO) Take 1 tablet by mouth daily.   Yes  [provider]  DULoxetine (CYMBALTA) 30 MG capsule Take 30 mg by mouth daily. Take with   Yes [provider]  DULoxetine (CYMBALTA) 60 MG capsule Take 60 mg by mouth daily. 08/17/21  Yes [provider]  meloxicam (MOBIC) 15 MG tablet Take 15 mg by mouth daily. 12/17/19  Yes [provider]  Multiple Vitamin (MULTIVITAMIN) capsule Take 1 capsule by mouth daily.   Yes [provider]  Polyethylene Glycol 3350 (MIRALAX PO) Take 1 Scoop by mouth daily as needed for constipation.   Yes [provider]  traMADol (ULTRAM) 50 MG tablet Take 50 mg by mouth every 6 (six) hours as needed for pain. Patient not taking: Reported on 05/16/2023    [provider]      Allergies    Epinephrine, Ibuprofen, Penicillins, and Sulfa antibiotics    Review of Systems   Review of Systems  Gastrointestinal:  Positive for blood in stool.  Neurological:  Positive for weakness.  All other systems reviewed and are negative.   Physical Exam Updated Vital Signs BP 99/78   Pulse 81   Temp (!) 97.5 F (36.4 C) (Oral)   Resp 15   Ht 4\' 11"  (1.499 m)   Wt 54.4 kg   LMP  (LMP Unknown)  SpO2 100%   BMI 24.24 kg/m  Physical Exam Vitals and nursing note reviewed. Exam conducted with a chaperone present.  Constitutional:      Appearance: She is ill-appearing.  HENT:     Head: Normocephalic and atraumatic.     Right Ear: External ear normal.     Left Ear: External ear normal.     Nose: Nose normal.     Mouth/Throat:     Mouth: Mucous membranes are dry.  Eyes:     Extraocular Movements: Extraocular movements intact.     Conjunctiva/sclera: Conjunctivae normal.     Pupils: Pupils are equal, round, and reactive to light.  Cardiovascular:     Rate and Rhythm: Regular rhythm. Bradycardia present.     Pulses: Normal pulses.     Heart sounds: Normal heart sounds.  Pulmonary:     Effort: Pulmonary effort is normal.     Breath sounds: Normal breath  sounds.  Abdominal:     General: Abdomen is flat. Bowel sounds are normal.     Palpations: Abdomen is soft.  Genitourinary:    Rectum: Guaiac result positive.     Comments: No stool in vault.  Mucous is +. Musculoskeletal:        General: Normal range of motion.     Cervical back: Normal range of motion and neck supple.  Skin:    General: Skin is warm.     Capillary Refill: Capillary refill takes less than 2 seconds.  Neurological:     General: No focal deficit present.     Mental Status: She is alert and oriented to person, place, and time.  Psychiatric:        Mood and Affect: Mood normal.        Behavior: Behavior normal.     ED Results / Procedures / Treatments   Labs (all labs ordered are listed, but only abnormal results are displayed) Labs Reviewed  COMPREHENSIVE METABOLIC PANEL - Abnormal; Notable for the following components:      Result Value   CO2 18 (*)    Glucose, Bld 130 (*)    BUN 37 (*)    Calcium 7.2 (*)    Total Protein 4.5 (*)    Albumin 2.8 (*)    Alkaline Phosphatase 26 (*)    All other components within normal limits  CBC WITH DIFFERENTIAL/PLATELET - Abnormal; Notable for the following components:   RBC 2.13 (*)    Hemoglobin 7.3 (*)    HCT 21.9 (*)    MCV 102.8 (*)    MCH 34.3 (*)    All other components within normal limits  PROTIME-INR - Abnormal; Notable for the following components:   Prothrombin Time 17.5 (*)    INR 1.4 (*)    All other components within normal limits  POC OCCULT BLOOD, ED - Abnormal; Notable for the following components:   Fecal Occult Bld POSITIVE (*)    All other components within normal limits  TYPE AND SCREEN  PREPARE RBC (CROSSMATCH)    EKG EKG Interpretation Date/Time:  Friday May 16 2023 10:17:50 EDT Ventricular Rate:  79 PR Interval:  160 QRS Duration:  104 QT Interval:  428 QTC Calculation: 491 R Axis:   62  Text Interpretation: Sinus rhythm Borderline T abnormalities, anterior leads Borderline  prolonged QT interval No significant change since last tracing Confirmed by Jacalyn Lefevre [phone]) on 05/16/2023 11:20:13 AM  Radiology CT Angio Abd/Pel W and/or Wo Contrast  Result Date: 05/16/2023 CLINICAL DATA:  72 year old female with a history of lower GI bleeding EXAM: CTA ABDOMEN AND PELVIS WITHOUT AND WITH CONTRAST TECHNIQUE: Multidetector CT imaging of the abdomen and pelvis was performed using the standard protocol during bolus administration of intravenous contrast. Multiplanar reconstructed images and MIPs were obtained and reviewed to evaluate the vascular anatomy. RADIATION DOSE REDUCTION: This exam was performed according to the departmental dose-optimization program which includes automated exposure control, adjustment of the mA and/or kV according to patient size and/or use of iterative reconstruction technique. CONTRAST:  75mL OMNIPAQUE IOHEXOL 350 MG/ML SOLN COMPARISON:  None Available. FINDINGS: VASCULAR Aorta: Mild atherosclerotic changes of the distal thoracic and abdominal aorta. No pedunculated plaque, ulcerated plaque. No dissection or aneurysm. Celiac: Celiac artery patent. Distal splenic artery aneurysm, estimated 10 mm. 50% circumferential wall calcifications. No inflammatory changes. There is no comparison available with contrast. Prior chest CT 12/18/2022, approximately 10 mm previously on the noncontrast. SMA: Patent with no significant atherosclerosis. Replaced right hepatic artery. Renals: - Right: Right renal artery patent. - Left: Left renal artery patent. IMA: Inferior mesenteric artery is patent, though small caliber. Right lower extremity: Unremarkable course, caliber, and contour of the right iliac system. Minimal atherosclerosis. No aneurysm, dissection, or occlusion. Hypogastric artery is patent. Common femoral artery patent. Proximal SFA and profunda femoris patent. Left lower extremity: Unremarkable course, caliber, and contour of the left iliac system. Minimal  atherosclerosis. No aneurysm, dissection, or occlusion. Hypogastric artery is patent. Common femoral artery patent. Proximal SFA and profunda femoris patent. Veins: Unremarkable appearance of the venous system. Review of the MIP images confirms the above findings. NON-VASCULAR Lower chest: No acute. Hepatobiliary: Unremarkable appearance of the liver. Unremarkable gall bladder. Pancreas: Unremarkable. Spleen: Unremarkable. Adrenals/Urinary Tract: - Right adrenal gland: Unremarkable - Left adrenal gland: Unremarkable. - Right kidney: No hydronephrosis, nephrolithiasis, inflammation, or ureteral dilation. No focal lesion. - Left Kidney: No hydronephrosis, nephrolithiasis, inflammation, or ureteral dilation. No focal lesion. - Urinary Bladder: Urinary bladder somewhat distended. Stomach/Bowel: - Stomach: Small hiatal hernia.  Otherwise unremarkable. - Small bowel: Unremarkable - Appendix: Normal. - Colon: Fluid-filled right colon. No accumulation of contrast on the arterial phase or the venous phase. No inflammatory changes. No wall thickening. Enhancement within normal limits. Minimal diverticular change. Lymphatic: No adenopathy. Mesenteric: No free fluid or air. No mesenteric adenopathy. Reproductive: Unremarkable appearance of the pelvic organs. Other: No hernia. Musculoskeletal: Surgical changes posterior lumbar interbody fusion with bilateral pedicle screw and rod fixation spanning L2-L5. Schmorl's node L1. No displaced fracture. IMPRESSION: CT angiogram negative for evidence of acute GI hemorrhage. 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 Aortic Atherosclerosis (ICD10-I70.0). Additional ancillary findings as above. Signed, Yvone Neu. Miachel Roux, RPVI [facility] Radiology Electronically Signed   By: Gilmer Mor D.O.   On: 05/16/2023 12:52   DG Chest 1 View  Result Date: 05/16/2023 CLINICAL DATA:  GI bleed EXAM: CHEST  1 VIEW COMPARISON:  Chest radiograph dated  12/08/2018 FINDINGS: Normal lung volumes. No focal consolidations. No pleural effusion or pneumothorax. The heart size and mediastinal contours are within normal limits. No acute osseous abnormality. IMPRESSION: No active disease. Electronically Signed   By: Agustin Cree M.D.   On: 05/16/2023 10:47    Procedures Procedures    Medications Ordered in ED Medications  sodium chloride 0.9 % bolus 1,000 mL (0 mLs Intravenous Stopped 05/16/23 1108)    And  0.9 %  sodium chloride infusion (has no administration in time range)  0.9 %  sodium chloride infusion (  Manually program via Guardrails IV Fluids) (0 mLs Intravenous Hold 05/16/23 1055)  pantoprazole (PROTONIX) 80 mg /NS 100 mL IVPB (has no administration in time range)  pantoprozole (PROTONIX) 80 mg /NS 100 mL infusion (has no administration in time range)  iohexol (OMNIPAQUE) 350 MG/ML injection 75 mL (75 mLs Intravenous Contrast Given 05/16/23 1202)    ED Course/ Medical Decision Making/ A&P                                 Medical Decision Making Amount and/or Complexity of Data Reviewed Labs: ordered. Radiology: ordered.  Risk Prescription drug management. Decision regarding hospitalization.   This patient presents to the ED for concern of gi bleed, this involves an extensive number of treatment options, and is a complaint that carries with it a high risk of complications and morbidity.  The differential diagnosis includes upper vs lower   Co morbidities that complicate the patient evaluation   CAD, anxiety, depression, hld, htn, and cad.   Additional history obtained:  Additional history obtained from epic chart review External records from outside source obtained and reviewed including EMS report   Lab Tests:  I Ordered, and personally interpreted labs.  The pertinent results include:  cbc with hgb 7.3 (hgb 13.7 in 2020); inr 1.4, cmp with bun elevated at 37   Imaging Studies ordered:  I ordered imaging studies  including cxr  I independently visualized and interpreted imaging which showed No active disease.  I agree with the radiologist interpretation   Cardiac Monitoring:  The patient was maintained on a cardiac monitor.  I personally viewed and interpreted the cardiac monitored which showed an underlying rhythm of: nsr   Medicines ordered and prescription drug management:  I ordered medication including ivfs  for sx  Reevaluation of the patient after these medicines showed that the patient improved I have reviewed the patients home medicines and have made adjustments as needed   Test Considered:  ct   Critical Interventions:  transfusion   Consultations Obtained:  I requested consultation with the [facility],  and discussed lab and imaging findings as well as pertinent plan - they will see her in consult Pt d/w Dr. Imogene Burn (triad) who will admit   Problem List / ED Course:  Symptomatic anemia:  Pt did consent for transfusion.  1 unit prbcs ordered for transfusion. Hemorrhagic shock:  bp has improved with ivfs and blood, but still remains soft GI bleed:  initially, it was described as BRBPR, but daughter showed me a picture on her phone of the bathroom.  Stool looks like melena.  So, protonix started.   Reevaluation:  After the interventions noted above, I reevaluated the patient and found that they have :improved   Social Determinants of Health:  Lives at home   Dispostion:  After consideration of the diagnostic results and the patients response to treatment, I feel that the patent would benefit from admission.    CRITICAL CARE Performed by: Jacalyn Lefevre   Total critical care time: 30 minutes  Critical care time was exclusive of separately billable procedures and treating other patients.  Critical care was necessary to treat or prevent imminent or life-threatening deterioration.  Critical care was time spent personally by me on the following activities:  development of treatment plan with patient and/or surrogate as well as nursing, discussions with consultants, evaluation of patient's response to treatment, examination of patient, obtaining history from patient  or surrogate, ordering and performing treatments and interventions, ordering and review of laboratory studies, ordering and review of radiographic studies, pulse oximetry and re-evaluation of patient's condition.         Final Clinical Impression(s) / ED Diagnoses Final diagnoses:  Symptomatic anemia  Gastrointestinal hemorrhage with melena    Rx / DC Orders ED Discharge Orders     None         Jacalyn Lefevre, MD 05/16/23 1320

## 2023-05-16 NOTE — Assessment & Plan Note (Signed)
Pt denies being a Jehovah's witness. Agreeable to PRBC transfusion. Repeat HgB at 8 pm. Goal is HgB >8 g/dl.

## 2023-05-16 NOTE — Transfer of Care (Signed)
Immediate Anesthesia Transfer of Care Note  Patient: Carol Love  Procedure(s) Performed: ESOPHAGOGASTRODUODENOSCOPY (EGD) WITH PROPOFOL BIOPSY HEMOSTASIS CONTROL SUBMUCOSAL INJECTION  Patient Location: PACU  Anesthesia Type:MAC  Level of Consciousness: awake, alert , and oriented  Airway & Oxygen Therapy: Patient Spontanous Breathing and Patient connected to nasal cannula oxygen  Post-op Assessment: Report given to RN and Post -op Vital signs reviewed and stable  Post vital signs: Reviewed and stable  Last Vitals:  Vitals Value Taken Time  BP    Temp    Pulse    Resp    SpO2      Last Pain:  Vitals:   05/16/23 1411  TempSrc: Temporal  PainSc: 0-No pain         Complications: No notable events documented.

## 2023-05-17 ENCOUNTER — Other Ambulatory Visit (HOSPITAL_COMMUNITY): Payer: Self-pay

## 2023-05-17 DIAGNOSIS — K259 Gastric ulcer, unspecified as acute or chronic, without hemorrhage or perforation: Secondary | ICD-10-CM | POA: Diagnosis not present

## 2023-05-17 DIAGNOSIS — K254 Chronic or unspecified gastric ulcer with hemorrhage: Secondary | ICD-10-CM

## 2023-05-17 DIAGNOSIS — K921 Melena: Secondary | ICD-10-CM | POA: Diagnosis not present

## 2023-05-17 DIAGNOSIS — D62 Acute posthemorrhagic anemia: Secondary | ICD-10-CM | POA: Diagnosis not present

## 2023-05-17 LAB — CBC
HCT: 22.4 % — ABNORMAL LOW (ref 36.0–46.0)
Hemoglobin: 7.9 g/dL — ABNORMAL LOW (ref 12.0–15.0)
MCH: 33.6 pg (ref 26.0–34.0)
MCHC: 35.3 g/dL (ref 30.0–36.0)
MCV: 95.3 fL (ref 80.0–100.0)
Platelets: 115 10*3/uL — ABNORMAL LOW (ref 150–400)
RBC: 2.35 MIL/uL — ABNORMAL LOW (ref 3.87–5.11)
RDW: 14.6 % (ref 11.5–15.5)
WBC: 7.4 10*3/uL (ref 4.0–10.5)
nRBC: 0 % (ref 0.0–0.2)

## 2023-05-17 LAB — COMPREHENSIVE METABOLIC PANEL
ALT: 17 U/L (ref 0–44)
AST: 19 U/L (ref 15–41)
Albumin: 2.8 g/dL — ABNORMAL LOW (ref 3.5–5.0)
Alkaline Phosphatase: 29 U/L — ABNORMAL LOW (ref 38–126)
Anion gap: 5 (ref 5–15)
BUN: 14 mg/dL (ref 8–23)
CO2: 23 mmol/L (ref 22–32)
Calcium: 7.1 mg/dL — ABNORMAL LOW (ref 8.9–10.3)
Chloride: 109 mmol/L (ref 98–111)
Creatinine, Ser: 0.58 mg/dL (ref 0.44–1.00)
GFR, Estimated: >60 mL/min (ref >60)
Glucose, Bld: 96 mg/dL (ref 70–99)
Potassium: 2.9 mmol/L — ABNORMAL LOW (ref 3.5–5.1)
Sodium: 137 mmol/L (ref 135–145)
Total Bilirubin: 0.6 mg/dL (ref 0.3–1.2)
Total Protein: 4.3 g/dL — ABNORMAL LOW (ref 6.5–8.1)

## 2023-05-17 LAB — TYPE AND SCREEN
ABO/RH(D): O POS
Antibody Screen: NEGATIVE
Unit division: 0

## 2023-05-17 LAB — BPAM RBC
Blood Product Expiration Date: 202409182359
ISSUE DATE / TIME: 202408231151
Unit Type and Rh: 5100

## 2023-05-17 MED ORDER — ONDANSETRON HCL 4 MG/2ML IJ SOLN: 4.0000 mg | Freq: Four times a day (QID) | INTRAMUSCULAR | Status: DC | PRN

## 2023-05-17 MED ORDER — PANTOPRAZOLE SODIUM 40 MG PO TBEC
40.0000 mg | DELAYED_RELEASE_TABLET | Freq: Two times a day (BID) | ORAL | 0 refills | Status: DC
  Filled 2023-05-17: qty 60, 30d supply, fill #0

## 2023-05-17 MED ORDER — MELATONIN 5 MG PO TABS: 10.0000 mg | ORAL_TABLET | Freq: Every evening | ORAL | Status: DC | PRN

## 2023-05-17 MED ORDER — AMITRIPTYLINE HCL 50 MG PO TABS
50.0000 mg | ORAL_TABLET | Freq: Every day | ORAL | Status: DC
  Filled 2023-05-17: qty 1

## 2023-05-17 MED ORDER — ALPRAZOLAM 0.5 MG PO TABS: 1.0000 mg | ORAL_TABLET | Freq: Every evening | ORAL | Status: DC | PRN

## 2023-05-17 MED ORDER — AMLODIPINE BESYLATE 5 MG PO TABS: 5.0000 mg | ORAL_TABLET | Freq: Every day | ORAL | Status: AC

## 2023-05-17 MED ORDER — ARIPIPRAZOLE 5 MG PO TABS: 5.0000 mg | ORAL_TABLET | Freq: Every day | ORAL | Status: DC

## 2023-05-17 MED ORDER — ALPRAZOLAM 0.5 MG PO TABS
0.5000 mg | ORAL_TABLET | Freq: Every day | ORAL | Status: DC
  Administered 2023-05-17: 0.5 mg via ORAL
  Filled 2023-05-17: qty 1

## 2023-05-17 MED ORDER — DULOXETINE HCL 60 MG PO CPEP
60.0000 mg | ORAL_CAPSULE | Freq: Every day | ORAL | Status: DC
  Administered 2023-05-17: 60 mg via ORAL
  Filled 2023-05-17: qty 1

## 2023-05-17 MED ORDER — ONDANSETRON HCL 4 MG PO TABS: 4.0000 mg | ORAL_TABLET | Freq: Four times a day (QID) | ORAL | Status: DC | PRN

## 2023-05-17 MED ORDER — ACETAMINOPHEN 325 MG PO TABS: 650.0000 mg | ORAL_TABLET | Freq: Four times a day (QID) | ORAL | Status: DC | PRN

## 2023-05-17 MED ORDER — PANTOPRAZOLE SODIUM 40 MG PO TBEC
40.0000 mg | DELAYED_RELEASE_TABLET | Freq: Two times a day (BID) | ORAL | 2 refills | Status: AC
Start: 2023-06-16 — End: 2024-04-05

## 2023-05-17 MED ORDER — PANTOPRAZOLE SODIUM 40 MG PO TBEC: 40.0000 mg | DELAYED_RELEASE_TABLET | Freq: Two times a day (BID) | ORAL | 0 refills | Status: DC

## 2023-05-17 MED ORDER — BUSPIRONE HCL 5 MG PO TABS
15.0000 mg | ORAL_TABLET | Freq: Two times a day (BID) | ORAL | Status: DC
  Administered 2023-05-17: 15 mg via ORAL
  Filled 2023-05-17: qty 3

## 2023-05-17 MED ORDER — ACETAMINOPHEN 650 MG RE SUPP: 650.0000 mg | Freq: Four times a day (QID) | RECTAL | Status: DC | PRN

## 2023-05-17 MED ORDER — POTASSIUM CHLORIDE CRYS ER 20 MEQ PO TBCR
40.0000 meq | EXTENDED_RELEASE_TABLET | Freq: Four times a day (QID) | ORAL | Status: DC
Start: 2023-05-17 — End: 2023-05-17

## 2023-05-17 MED ORDER — POTASSIUM CHLORIDE CRYS ER 20 MEQ PO TBCR
40.0000 meq | EXTENDED_RELEASE_TABLET | Freq: Once | ORAL | Status: AC
  Administered 2023-05-17: 40 meq via ORAL
  Filled 2023-05-17: qty 2

## 2023-05-17 MED ORDER — POTASSIUM CHLORIDE CRYS ER 20 MEQ PO TBCR
30.0000 meq | EXTENDED_RELEASE_TABLET | Freq: Four times a day (QID) | ORAL | Status: DC
  Administered 2023-05-17: 30 meq via ORAL
  Filled 2023-05-17: qty 1

## 2023-05-17 MED ORDER — POTASSIUM CHLORIDE 10 MEQ/100ML IV SOLN
10.0000 meq | INTRAVENOUS | Status: AC
  Administered 2023-05-17 (×3): 10 meq via INTRAVENOUS
  Filled 2023-05-17 (×3): qty 100

## 2023-05-17 NOTE — Progress Notes (Addendum)
Progress Note   Subjective  Day #2 Chief Complaint: Melena  Status post EGD 05/16/2023 with multiple gastric ulcers thought to be the source of recent melena.  H. pylori biopsies pending.  Overnight patient has done well, she is still on a clear liquid diet but is about to try some regular food as ordered by the hospitalist team.  She has no abdominal pain and never has.  She is surprised by the finding of multiple ulcers with no symptoms.  No further bowel movements over the past 24 hours.  In general feeling well.   Objective   Vital signs in last 24 hours: Temp:  [97.1 F (36.2 C)-98.5 F (36.9 C)] 98.5 F (36.9 C) (08/24 0800) Pulse Rate:  [45-95] 94 (08/24 0415) Resp:  [14-26] 17 (08/24 0415) BP: (93-140)/(56-78) 108/72 (08/24 0415) SpO2:  [95 %-100 %] 100 % (08/24 0415) Weight:  [54.4 kg] 54.4 kg (08/23 1018) Last BM Date : 05/16/23 General:    white female in NAD Heart:  Regular rate and rhythm; no murmurs Lungs: Respirations even and unlabored, lungs CTA bilaterally Abdomen:  Soft, nontender and nondistended. Normal bowel sounds. Psych:  Cooperative. Normal mood and affect.  Intake/Output from previous day: 08/23 0701 - 08/24 0700 In: 420.7 [I.V.:100; Blood:320.7] Out: 0    Lab Results: Recent Labs    05/16/23 1022 05/16/23 2008 05/17/23 0317  WBC 6.9  --  7.4  HGB 7.3* 8.2* 7.9*  HCT 21.9* 23.2* 22.4*  PLT 153  --  115*   BMET Recent Labs    05/16/23 1022 05/17/23 0317  NA 135 137  K 4.0 2.9*  CL 108 109  CO2 18* 23  GLUCOSE 130* 96  BUN 37* 14  CREATININE 0.64 0.58  CALCIUM 7.2* 7.1*   LFT Recent Labs    05/17/23 0317  PROT 4.3*  ALBUMIN 2.8*  AST 19  ALT 17  ALKPHOS 29*  BILITOT 0.6   PT/INR Recent Labs    05/16/23 1022  LABPROT 17.5*  INR 1.4*    Studies/Results: CT Angio Abd/Pel W and/or Wo Contrast  Result Date: 05/16/2023 CLINICAL DATA:  72 year old female with a history of lower GI bleeding EXAM: CTA ABDOMEN AND  PELVIS WITHOUT AND WITH CONTRAST TECHNIQUE: Multidetector CT imaging of the abdomen and pelvis was performed using the standard protocol during bolus administration of intravenous contrast. Multiplanar reconstructed images and MIPs were obtained and reviewed to evaluate the vascular anatomy. RADIATION DOSE REDUCTION: This exam was performed according to the departmental dose-optimization program which includes automated exposure control, adjustment of the mA and/or kV according to patient size and/or use of iterative reconstruction technique. CONTRAST:  75mL OMNIPAQUE IOHEXOL 350 MG/ML SOLN COMPARISON:  None Available. FINDINGS: VASCULAR Aorta: Mild atherosclerotic changes of the distal thoracic and abdominal aorta. No pedunculated plaque, ulcerated plaque. No dissection or aneurysm. Celiac: Celiac artery patent. Distal splenic artery aneurysm, estimated 10 mm. 50% circumferential wall calcifications. No inflammatory changes. There is no comparison available with contrast. Prior chest CT 12/18/2022, approximately 10 mm previously on the noncontrast. SMA: Patent with no significant atherosclerosis. Replaced right hepatic artery. Renals: - Right: Right renal artery patent. - Left: Left renal artery patent. IMA: Inferior mesenteric artery is patent, though small caliber. Right lower extremity: Unremarkable course, caliber, and contour of the right iliac system. Minimal atherosclerosis. No aneurysm, dissection, or occlusion. Hypogastric artery is patent. Common femoral artery patent. Proximal SFA and profunda femoris patent. Left lower extremity: Unremarkable course, caliber, and contour  of the left iliac system. Minimal atherosclerosis. No aneurysm, dissection, or occlusion. Hypogastric artery is patent. Common femoral artery patent. Proximal SFA and profunda femoris patent. Veins: Unremarkable appearance of the venous system. Review of the MIP images confirms the above findings. NON-VASCULAR Lower chest: No acute.  Hepatobiliary: Unremarkable appearance of the liver. Unremarkable gall bladder. Pancreas: Unremarkable. Spleen: Unremarkable. Adrenals/Urinary Tract: - Right adrenal gland: Unremarkable - Left adrenal gland: Unremarkable. - Right kidney: No hydronephrosis, nephrolithiasis, inflammation, or ureteral dilation. No focal lesion. - Left Kidney: No hydronephrosis, nephrolithiasis, inflammation, or ureteral dilation. No focal lesion. - Urinary Bladder: Urinary bladder somewhat distended. Stomach/Bowel: - Stomach: Small hiatal hernia.  Otherwise unremarkable. - Small bowel: Unremarkable - Appendix: Normal. - Colon: Fluid-filled right colon. No accumulation of contrast on the arterial phase or the venous phase. No inflammatory changes. No wall thickening. Enhancement within normal limits. Minimal diverticular change. Lymphatic: No adenopathy. Mesenteric: No free fluid or air. No mesenteric adenopathy. Reproductive: Unremarkable appearance of the pelvic organs. Other: No hernia. Musculoskeletal: Surgical changes posterior lumbar interbody fusion with bilateral pedicle screw and rod fixation spanning L2-L5. Schmorl's node L1. No displaced fracture. IMPRESSION: CT angiogram negative for evidence of acute GI hemorrhage. 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 Aortic Atherosclerosis (ICD10-I70.0). Additional ancillary findings as above. Signed, Yvone Neu. Miachel Roux, RPVI [facility] Radiology Electronically Signed   By: Gilmer Mor D.O.   On: 05/16/2023 12:52   DG Chest 1 View  Result Date: 05/16/2023 CLINICAL DATA:  GI bleed EXAM: CHEST  1 VIEW COMPARISON:  Chest radiograph dated 12/08/2018 FINDINGS: Normal lung volumes. No focal consolidations. No pleural effusion or pneumothorax. The heart size and mediastinal contours are within normal limits. No acute osseous abnormality. IMPRESSION: No active disease. Electronically Signed   By: Agustin Cree M.D.   On: 05/16/2023 10:47      Assessment / Plan:   Assessment: 1.  Upper GI bleed: Started with 1 large melenic stool in the morning of 8/23, hemoglobin 7.3 on admission--> 1 unit PRBCs--> 8.2--> 7.9, EGD yesterday with multiple gastric ulcers thought to be the source of bleeding, H. pylori biopsies pending, patient did well overnight with no further bowel movements. 2.  CAD 3. Hypokalemia  Plan: 1.  Discontinue aspirin usage 2.  Discussed with patient that she would likely have a repeat EGD in 4 to 6 weeks but we will discuss further at her follow-up in clinic in the next 3 to 4 weeks.  This will be arranged for her.  She will likely get a call from my office on Monday. 3.  For now continue Pantoprazole 40 mg p.o. twice daily likely for the next 8 weeks. 4.  Patient will be called with results from H. pylori biopsies.\ 5. Correction of Potassium  We will sign off.  Thank you for your kind consultation.   LOS: 0 days   Unk Lightning  05/17/2023, 9:43 AM  I have taken an interval history, thoroughly reviewed the chart and examined the patient. I agree with the Advanced Practitioner's note, impression and recommendations, and have recorded additional findings, impressions and recommendations below. I performed a substantive portion of this encounter (>50% time spent), including a complete performance of the medical decision making.  My additional thoughts are as follows:  No BM, melena or bright red blood per rectum since before yesterday's EGD. She denies abdominal pain, chest pain or dyspnea.  Hemoglobin stable since transfusion, bleeding appears to have stopped.  Gastric ulcer  visualized on EGD apparent culprit.  Please see my initial consult note and EGD report for further thoughts regarding the possible chronicity of this anemia in addition to the acute component from overt GI bleeding.  Note that the anemia was macrocytic on admission.  Plan as noted above, and we will coordinate follow-up with Dr.  Lavon Paganini her primary GI physician at our practice. Okay for home today from GI perspective.   Charlie Pitter III [phone]

## 2023-05-17 NOTE — TOC Transition Note (Signed)
Discharge medications are filled and stored in the [facility] awaiting patient discharge.  ** WILL NEED TO BE PICKED UP **

## 2023-05-17 NOTE — Discharge Summary (Signed)
Physician Discharge Summary  CHEROKEE BRUNICK BJY:[id number] DOB: 04-01-1951 DOA: 05/16/2023  PCP: Melida Quitter, MD  Admit date: 05/16/2023 Discharge date: 05/17/2023  Recommendations for Outpatient Follow-up:  Follow up with PCP in 1-2 weeks Please obtain BMP/CBC in one week Please follow on the final results of H. pylori biopsies patient will need to follow with GI regarding repeat endoscopy in 4 to 6 weeks   Diet recommendation: Heart Healthy   Brief/Interim Summary:  72 year old Caucasian female history of hypertension, coronary disease, hyperlipidemia, spinal stenosis who presents to the ER today with sudden onset of melanotic stools , CT angio abdomen pelvis was negative for GI bleed, hemoglobin low at 7.3, she received 1 unit PRBC, GI consulted,Status post EGD 05/16/2023 with multiple gastric ulcers thought to be the source of recent melena. H. pylori biopsies pending.  Her hemoglobin is stable, kept on Protonix gtt. overnight, plan to DC today on Protonix 40 mg p.o. twice daily.  Acute blood loss anemia due to upper GI bleed secondary to multiple gastric ulcers Hemoglobin 7.3 on admission, she received 1 unit PRBC, hemoglobin stable this morning at 7.9,Status post EGD 05/16/2023 with multiple gastric ulcers thought to be the source of recent melena. H. pylori biopsies pending, she was kept on next drip overnight, she will be transition to Protonix 40 mg p.o. twice daily at time of discharge, to follow on H. pylori biopsies as an outpatient, she was instructed to discontinue her Mobic, to decrease her alcohol consumption, she was treated to hold aspirin as well until cleared by GI, and for repeat endoscopy in 4 to 6 weeks as an outpatient.   CAD (coronary artery disease) Will hold ASA as above   Spinal stenosis of lumbar region Instructed to hold her Mobic,   Hyperlipidemia Stable. Continue lipitor.   Essential (primary) hypertension Pressure has been soft, continue with acebutolol,  and to resume Norvasc in 5 days due to acute blood loss anemia, will hold HTN meds for now.   Alcohol use Pt is ethnic [place] and drinks 3 glasses of wine per day. She states she has does this for a number of decades. Has never had difficulty with alcohol use. Has never had trouble with stopping alcohol use.  Hypokalemia -potassium of 2.9, this was replaced both IV and orally prior to discharge.   Discharge Diagnoses:  Principal Problem:   GI bleeding Active Problems:   ABLA (acute blood loss anemia)   Essential (primary) hypertension   Hyperlipidemia   Spinal stenosis of lumbar region   CAD (coronary artery disease)   Alcohol use   Chronic gastric ulcer with hemorrhage   Melena    Discharge Instructions  Discharge Instructions     Diet - low sodium heart healthy   Complete by: As directed    Discharge instructions   Complete by: As directed    Follow with Primary MD Melida Quitter, MD in 7 days   Get CBC, CMP, 2 view Chest X ray checked  by Primary MD next visit.    Activity: As tolerated with Full fall precautions use walker/cane & assistance as needed   Disposition Home    Diet: Heart healthy  On your next visit with your primary care physician please Get Medicines reviewed and adjusted.   Please request your Prim.MD to go over all Hospital Tests and Procedure/Radiological results at the follow up, please get all Hospital records sent to your Prim MD by signing hospital release before you go home.  If you experience worsening of your admission symptoms, develop shortness of breath, life threatening emergency, suicidal or homicidal thoughts you must seek medical attention immediately by calling 911 or calling your MD immediately  if symptoms less severe.  You Must read complete instructions/literature along with all the possible adverse reactions/side effects for all the Medicines you take and that have been prescribed to you. Take any new Medicines after you  have completely understood and accpet all the possible adverse reactions/side effects.   Do not drive, operating heavy machinery, perform activities at heights, swimming or participation in water activities or provide baby sitting services if your were admitted for syncope or siezures until you have seen by Primary MD or a Neurologist and advised to do so again.  Do not drive when taking Pain medications.    Do not take more than prescribed Pain, Sleep and Anxiety Medications  Special Instructions: If you have smoked or chewed Tobacco  in the last 2 yrs please stop smoking, stop any regular Alcohol  and or any Recreational drug use.  Wear Seat belts while driving.   Please note  You were cared for by a hospitalist during your hospital stay. If you have any questions about your discharge medications or the care you received while you were in the hospital after you are discharged, you can call the unit and asked to speak with the hospitalist on call if the hospitalist that took care of you is not available. Once you are discharged, your primary care physician will handle any further medical issues. Please note that NO REFILLS for any discharge medications will be authorized once you are discharged, as it is imperative that you return to your primary care physician (or establish a relationship with a primary care physician if you do not have one) for your aftercare needs so that they can reassess your need for medications and monitor your lab values.   Increase activity slowly   Complete by: As directed       Allergies as of 05/17/2023       Reactions   Epinephrine Other (See Comments)   Gets migraines Pt cannot recall this allergy.   Ibuprofen Itching, Swelling   Penicillins Rash   Did it involve swelling of the face/tongue/throat, SOB, or low BP? Unknown Did it involve sudden or severe rash/hives, skin peeling, or any reaction on the inside of your mouth or nose? No Did you need to seek  medical attention at a hospital or doctor's office? Unknown When did it last happen?      Childhood allergy If all above answers are "NO", may proceed with cephalosporin use.   Sulfa Antibiotics Rash   Pt states this is a childhood allergy        Medication List     STOP taking these medications    alendronate 70 MG tablet Commonly known as: FOSAMAX   aspirin EC 81 MG tablet   meloxicam 15 MG tablet Commonly known as: MOBIC       TAKE these medications    acebutolol 200 MG capsule Commonly known as: SECTRAL Take 1 capsule (200 mg total) by mouth daily.   ALPRAZolam 0.5 MG tablet Commonly known as: XANAX Take 0.5 mg by mouth in the morning. And take 1 mg in the evening   amitriptyline 50 MG tablet Commonly known as: ELAVIL Take 50 mg by mouth at bedtime.   amLODipine 5 MG tablet Commonly known as: NORVASC Take 1 tablet (5 mg total) by  mouth daily.   ARIPiprazole 5 MG tablet Commonly known as: ABILIFY Take 5 mg by mouth at bedtime.   atorvastatin 40 MG tablet Commonly known as: LIPITOR TAKE 1 TABLET BY MOUTH DAILY IN THE EVENING   busPIRone 15 MG tablet Commonly known as: BUSPAR Take 15 mg by mouth 2 (two) times daily.   CALCIUM + D PO Take 1 tablet by mouth daily.   DULoxetine 30 MG capsule Commonly known as: CYMBALTA Take 30 mg by mouth daily. Take with   DULoxetine 60 MG capsule Commonly known as: CYMBALTA Take 60 mg by mouth daily.   MIRALAX PO Take 1 Scoop by mouth daily as needed for constipation.   multivitamin capsule Take 1 capsule by mouth daily.   pantoprazole 40 MG tablet Commonly known as: Protonix Take 1 tablet (40 mg total) by mouth 2 (two) times daily. Start taking on: June 16, 2023        Allergies  Allergen Reactions   Epinephrine Other (See Comments)    Gets migraines  Pt cannot recall this allergy.   Ibuprofen Itching and Swelling   Penicillins Rash    Did it involve swelling of the face/tongue/throat,  SOB, or low BP? Unknown Did it involve sudden or severe rash/hives, skin peeling, or any reaction on the inside of your mouth or nose? No Did you need to seek medical attention at a hospital or doctor's office? Unknown When did it last happen?      Childhood allergy If all above answers are "NO", may proceed with cephalosporin use.    Sulfa Antibiotics Rash    Pt states this is a childhood allergy    Consultations: Gastroenterology   Procedures/Studies: CT Angio Abd/Pel W and/or Wo Contrast  Result Date: 05/16/2023 CLINICAL DATA:  72 year old female with a history of lower GI bleeding EXAM: CTA ABDOMEN AND PELVIS WITHOUT AND WITH CONTRAST TECHNIQUE: Multidetector CT imaging of the abdomen and pelvis was performed using the standard protocol during bolus administration of intravenous contrast. Multiplanar reconstructed images and MIPs were obtained and reviewed to evaluate the vascular anatomy. RADIATION DOSE REDUCTION: This exam was performed according to the departmental dose-optimization program which includes automated exposure control, adjustment of the mA and/or kV according to patient size and/or use of iterative reconstruction technique. CONTRAST:  75mL OMNIPAQUE IOHEXOL 350 MG/ML SOLN COMPARISON:  None Available. FINDINGS: VASCULAR Aorta: Mild atherosclerotic changes of the distal thoracic and abdominal aorta. No pedunculated plaque, ulcerated plaque. No dissection or aneurysm. Celiac: Celiac artery patent. Distal splenic artery aneurysm, estimated 10 mm. 50% circumferential wall calcifications. No inflammatory changes. There is no comparison available with contrast. Prior chest CT 12/18/2022, approximately 10 mm previously on the noncontrast. SMA: Patent with no significant atherosclerosis. Replaced right hepatic artery. Renals: - Right: Right renal artery patent. - Left: Left renal artery patent. IMA: Inferior mesenteric artery is patent, though small caliber. Right lower extremity:  Unremarkable course, caliber, and contour of the right iliac system. Minimal atherosclerosis. No aneurysm, dissection, or occlusion. Hypogastric artery is patent. Common femoral artery patent. Proximal SFA and profunda femoris patent. Left lower extremity: Unremarkable course, caliber, and contour of the left iliac system. Minimal atherosclerosis. No aneurysm, dissection, or occlusion. Hypogastric artery is patent. Common femoral artery patent. Proximal SFA and profunda femoris patent. Veins: Unremarkable appearance of the venous system. Review of the MIP images confirms the above findings. NON-VASCULAR Lower chest: No acute. Hepatobiliary: Unremarkable appearance of the liver. Unremarkable gall bladder. Pancreas: Unremarkable. Spleen: Unremarkable. Adrenals/Urinary Tract: -  Right adrenal gland: Unremarkable - Left adrenal gland: Unremarkable. - Right kidney: No hydronephrosis, nephrolithiasis, inflammation, or ureteral dilation. No focal lesion. - Left Kidney: No hydronephrosis, nephrolithiasis, inflammation, or ureteral dilation. No focal lesion. - Urinary Bladder: Urinary bladder somewhat distended. Stomach/Bowel: - Stomach: Small hiatal hernia.  Otherwise unremarkable. - Small bowel: Unremarkable - Appendix: Normal. - Colon: Fluid-filled right colon. No accumulation of contrast on the arterial phase or the venous phase. No inflammatory changes. No wall thickening. Enhancement within normal limits. Minimal diverticular change. Lymphatic: No adenopathy. Mesenteric: No free fluid or air. No mesenteric adenopathy. Reproductive: Unremarkable appearance of the pelvic organs. Other: No hernia. Musculoskeletal: Surgical changes posterior lumbar interbody fusion with bilateral pedicle screw and rod fixation spanning L2-L5. Schmorl's node L1. No displaced fracture. IMPRESSION: CT angiogram negative for evidence of acute GI hemorrhage. 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 Aortic Atherosclerosis (ICD10-I70.0). Additional  ancillary findings as above. Signed, Yvone Neu. Miachel Roux, RPVI [facility] Electronically Signed   By: Gilmer Mor D.O.   On: 05/16/2023 12:52   DG Chest 1 View  Result Date: 05/16/2023 CLINICAL DATA:  GI bleed EXAM: CHEST  1 VIEW COMPARISON:  Chest radiograph dated 12/08/2018 FINDINGS: Normal lung volumes. No focal consolidations. No pleural effusion or pneumothorax. The heart size and mediastinal contours are within normal limits. No acute osseous abnormality. IMPRESSION: No active disease. Electronically Signed   By: Agustin Cree M.D.   On: 05/16/2023 10:47   (Echo, Carotid, EGD, Colonoscopy, ERCP)    Subjective: No significant events overnight, she is tolerating regular diet  Discharge Exam: Vitals:   05/17/23 0415 05/17/23 0800  BP: 108/72 114/65  Pulse: 94 88  Resp: 17   Temp: 98.1 F (36.7 C) 98.5 F (36.9 C)  SpO2: 100% 95%   Vitals:   05/17/23 0000 05/17/23 0400 05/17/23 0415 05/17/23 0800  BP: (!) 109/56 105/67 108/72 114/65  Pulse: 87 86 94 88  Resp: 18  17   Temp: 98 F (36.7 C)  98.1 F (36.7 C) 98.5 F (36.9 C)  TempSrc:    Oral  SpO2: 95% 96% 100% 95%  Weight:      Height:        General: Pt is alert, awake, not in acute distress Cardiovascular: RRR, S1/S2 +, no rubs, no gallops Respiratory: CTA bilaterally, no wheezing, no rhonchi Abdominal: Soft, NT, ND, bowel sounds + Extremities: no edema, no cyanosis    The results of significant diagnostics from this hospitalization (including imaging, microbiology, ancillary and laboratory) are listed below for reference.     Microbiology: No results found for this or any previous visit (from the past 240 hour(s)).   Labs: BNP (last 3 results) No results for input(s): "BNP" in the last 8760 hours. Basic Metabolic Panel: Recent Labs  Lab 05/16/23 1022 05/17/23 0317  NA 135 137  K 4.0 2.9*  CL 108 109  CO2 18* 23  GLUCOSE 130* 96  BUN  37* 14  CREATININE 0.64 0.58  CALCIUM 7.2* 7.1*   Liver Function Tests: Recent Labs  Lab 05/16/23 1022 05/17/23 0317  AST 20 19  ALT 19 17  ALKPHOS 26* 29*  BILITOT 0.5 0.6  PROT 4.5* 4.3*  ALBUMIN 2.8* 2.8*   No results for input(s): "LIPASE", "AMYLASE" in the last 168 hours. No results for input(s): "AMMONIA" in the last 168 hours. CBC: Recent Labs  Lab 05/16/23 1022 05/16/23 2008 05/17/23 0317  WBC 6.9  --  7.4  NEUTROABS 5.4  --   --   HGB 7.3* 8.2* 7.9*  HCT 21.9* 23.2* 22.4*  MCV 102.8*  --  95.3  PLT 153  --  115*   Cardiac Enzymes: No results for input(s): "CKTOTAL", "CKMB", "CKMBINDEX", "TROPONINI" in the last 168 hours. BNP: Invalid input(s): "POCBNP" CBG: No results for input(s): "GLUCAP" in the last 168 hours. D-Dimer No results for input(s): "DDIMER" in the last 72 hours. Hgb A1c No results for input(s): "HGBA1C" in the last 72 hours. Lipid Profile No results for input(s): "CHOL", "HDL", "LDLCALC", "TRIG", "CHOLHDL", "LDLDIRECT" in the last 72 hours. Thyroid function studies No results for input(s): "TSH", "T4TOTAL", "T3FREE", "THYROIDAB" in the last 72 hours.  Invalid input(s): "FREET3" Anemia work up No results for input(s): "VITAMINB12", "FOLATE", "FERRITIN", "TIBC", "IRON", "RETICCTPCT" in the last 72 hours. Urinalysis    Component Value Date/Time   COLORURINE YELLOW 02/27/2019 0938   APPEARANCEUR CLEAR 02/27/2019 0938   LABSPEC 1.011 02/27/2019 0938   PHURINE 6.0 02/27/2019 0938   GLUCOSEU NEGATIVE 02/27/2019 0938   HGBUR SMALL (A) 02/27/2019 0938   BILIRUBINUR NEGATIVE 02/27/2019 0938   KETONESUR 20 (A) 02/27/2019 0938   PROTEINUR NEGATIVE 02/27/2019 0938   NITRITE NEGATIVE 02/27/2019 0938   LEUKOCYTESUR NEGATIVE 02/27/2019 0938   Sepsis Labs Recent Labs  Lab 05/16/23 1022 05/17/23 0317  WBC 6.9 7.4   Microbiology No results found for this or any previous visit (from the past 240 hour(s)).   Time coordinating discharge:  Over 30 minutes  SIGNED:   Huey Bienenstock, MD  [facility] 05/17/2023, 11:49 AM Pager   If 7PM-7AM, please contact night-coverage [email or web address]

## 2023-05-17 NOTE — Discharge Instructions (Signed)
Follow with Primary MD Melida Quitter, MD in 7 days   Get CBC, CMP, 2 view Chest X ray checked  by Primary MD next visit.    Activity: As tolerated with Full fall precautions use walker/cane & assistance as needed   Disposition Home    Diet: Heart healthy  On your next visit with your primary care physician please Get Medicines reviewed and adjusted.   Please request your Prim.MD to go over all Hospital Tests and Procedure/Radiological results at the follow up, please get all Hospital records sent to your Prim MD by signing hospital release before you go home.   If you experience worsening of your admission symptoms, develop shortness of breath, life threatening emergency, suicidal or homicidal thoughts you must seek medical attention immediately by calling 911 or calling your MD immediately  if symptoms less severe.  You Must read complete instructions/literature along with all the possible adverse reactions/side effects for all the Medicines you take and that have been prescribed to you. Take any new Medicines after you have completely understood and accpet all the possible adverse reactions/side effects.   Do not drive, operating heavy machinery, perform activities at heights, swimming or participation in water activities or provide baby sitting services if your were admitted for syncope or siezures until you have seen by Primary MD or a Neurologist and advised to do so again.  Do not drive when taking Pain medications.    Do not take more than prescribed Pain, Sleep and Anxiety Medications  Special Instructions: If you have smoked or chewed Tobacco  in the last 2 yrs please stop smoking, stop any regular Alcohol  and or any Recreational drug use.  Wear Seat belts while driving.   Please note  You were cared for by a hospitalist during your hospital stay. If you have any questions about your discharge medications or the care you received while you were in the hospital after you  are discharged, you can call the unit and asked to speak with the hospitalist on call if the hospitalist that took care of you is not available. Once you are discharged, your primary care physician will handle any further medical issues. Please note that NO REFILLS for any discharge medications will be authorized once you are discharged, as it is imperative that you return to your primary care physician (or establish a relationship with a primary care physician if you do not have one) for your aftercare needs so that they can reassess your need for medications and monitor your lab values.

## 2023-05-17 NOTE — Plan of Care (Signed)

## 2023-05-18 ENCOUNTER — Encounter (HOSPITAL_COMMUNITY): Payer: Self-pay | Admitting: Gastroenterology

## 2023-05-19 ENCOUNTER — Telehealth: Payer: Self-pay

## 2023-05-19 NOTE — Telephone Encounter (Signed)
-----   Message from Unk Lightning sent at 05/17/2023  9:47 AM EDT ----- Regarding: follow up Patient needs follow-up in clinic with myself or Dr. Lavon Paganini in the next 3 to 4 weeks.  This is for follow-up of gastric ulcers and upper GI bleed.  She is being discharged home today.  Thanks, JL L

## 2023-05-19 NOTE — Telephone Encounter (Signed)
Called and spoke with patient to schedule hospital follow up. Patient has been scheduled for an appt with Hyacinth Meeker, PA-C on Monday, 06/09/23 at 11:30 am. Patient requested that appt information be mailed to her, she confirmed address on file. Pt had no concerns at the end of the call.

## 2023-05-20 NOTE — Anesthesia Postprocedure Evaluation (Signed)
Anesthesia Post Note  Patient: Carol Love  Procedure(s) Performed: ESOPHAGOGASTRODUODENOSCOPY (EGD) WITH PROPOFOL BIOPSY HEMOSTASIS CONTROL SUBMUCOSAL INJECTION     Patient location during evaluation: Endoscopy Anesthesia Type: MAC Level of consciousness: awake and alert Pain management: pain level controlled Vital Signs Assessment: post-procedure vital signs reviewed and stable Respiratory status: spontaneous breathing, nonlabored ventilation, respiratory function stable and patient connected to nasal cannula oxygen Cardiovascular status: stable and blood pressure returned to baseline Postop Assessment: no apparent nausea or vomiting Anesthetic complications: no   No notable events documented.  Last Vitals:  Vitals:   05/17/23 0415 05/17/23 0800  BP: 108/72 114/65  Pulse: 94 88  Resp: 17   Temp: 36.7 C 36.9 C  SpO2: 100% 95%    Last Pain:  Vitals:   05/17/23 0800  TempSrc: Oral  PainSc: 0-No pain                 Eliora Nienhuis

## 2023-05-21 LAB — SURGICAL PATHOLOGY

## 2023-05-29 DIAGNOSIS — Z23 Encounter for immunization: Secondary | ICD-10-CM | POA: Diagnosis not present

## 2023-05-29 DIAGNOSIS — I10 Essential (primary) hypertension: Secondary | ICD-10-CM | POA: Diagnosis not present

## 2023-05-29 DIAGNOSIS — K274 Chronic or unspecified peptic ulcer, site unspecified, with hemorrhage: Secondary | ICD-10-CM | POA: Diagnosis not present

## 2023-05-29 LAB — BASIC METABOLIC PANEL: EGFR: 70.5

## 2023-06-02 ENCOUNTER — Other Ambulatory Visit: Payer: Self-pay | Admitting: Cardiology

## 2023-06-09 ENCOUNTER — Other Ambulatory Visit (INDEPENDENT_AMBULATORY_CARE_PROVIDER_SITE_OTHER): Payer: Medicare Other

## 2023-06-09 ENCOUNTER — Encounter: Payer: Self-pay | Admitting: Physician Assistant

## 2023-06-09 ENCOUNTER — Ambulatory Visit: Payer: Medicare Other | Admitting: Physician Assistant

## 2023-06-09 VITALS — BP 120/78 | Ht 59.0 in | Wt 125.8 lb

## 2023-06-09 DIAGNOSIS — D509 Iron deficiency anemia, unspecified: Secondary | ICD-10-CM | POA: Diagnosis not present

## 2023-06-09 DIAGNOSIS — K922 Gastrointestinal hemorrhage, unspecified: Secondary | ICD-10-CM | POA: Diagnosis not present

## 2023-06-09 DIAGNOSIS — Z8711 Personal history of peptic ulcer disease: Secondary | ICD-10-CM | POA: Diagnosis not present

## 2023-06-09 LAB — CBC WITH DIFFERENTIAL/PLATELET
Basophils Absolute: 0 10*3/uL (ref 0.0–0.1)
Basophils Relative: 0.6 % (ref 0.0–3.0)
Eosinophils Absolute: 0.1 10*3/uL (ref 0.0–0.7)
Eosinophils Relative: 1.9 % (ref 0.0–5.0)
HCT: 31.5 % — ABNORMAL LOW (ref 36.0–46.0)
Hemoglobin: 10.6 g/dL — ABNORMAL LOW (ref 12.0–15.0)
Lymphocytes Relative: 19.2 % (ref 12.0–46.0)
Lymphs Abs: 0.9 10*3/uL (ref 0.7–4.0)
MCHC: 33.7 g/dL (ref 30.0–36.0)
MCV: 95.2 fl (ref 78.0–100.0)
Monocytes Absolute: 0.4 10*3/uL (ref 0.1–1.0)
Monocytes Relative: 9.2 % (ref 3.0–12.0)
Neutro Abs: 3.3 10*3/uL (ref 1.4–7.7)
Neutrophils Relative %: 69.1 % (ref 43.0–77.0)
Platelets: 260 10*3/uL (ref 150.0–400.0)
RBC: 3.3 Mil/uL — ABNORMAL LOW (ref 3.87–5.11)
RDW: 13.7 % (ref 11.5–15.5)
WBC: 4.8 10*3/uL (ref 4.0–10.5)

## 2023-06-09 LAB — COMPREHENSIVE METABOLIC PANEL
ALT: 29 U/L (ref 0–35)
AST: 33 U/L (ref 0–37)
Albumin: 4.2 g/dL (ref 3.5–5.2)
Alkaline Phosphatase: 48 U/L (ref 39–117)
BUN: 9 mg/dL (ref 6–23)
CO2: 29 meq/L (ref 19–32)
Calcium: 9.1 mg/dL (ref 8.4–10.5)
Chloride: 104 meq/L (ref 96–112)
Creatinine, Ser: 0.88 mg/dL (ref 0.40–1.20)
GFR: 65.5 mL/min (ref >60.00)
Glucose, Bld: 76 mg/dL (ref 70–99)
Potassium: 4.3 meq/L (ref 3.5–5.1)
Sodium: 140 meq/L (ref 135–145)
Total Bilirubin: 0.6 mg/dL (ref 0.2–1.2)
Total Protein: 6.4 g/dL (ref 6.0–8.3)

## 2023-06-09 LAB — IBC PANEL
Iron: 76 ug/dL (ref 42–145)
Saturation Ratios: 17.7 % — ABNORMAL LOW (ref 20.0–50.0)
TIBC: 429.8 ug/dL (ref 250.0–450.0)
Transferrin: 307 mg/dL (ref 212.0–360.0)

## 2023-06-09 NOTE — Patient Instructions (Addendum)
You have been scheduled for an endoscopy. Please follow written instructions given to you at your visit today.  If you use inhalers (even only as needed), please bring them with you on the day of your procedure.  If you take any of the following medications, they will need to be adjusted prior to your procedure:   DO NOT TAKE 7 DAYS PRIOR TO TEST- Trulicity (dulaglutide) Ozempic, Wegovy (semaglutide) Mounjaro (tirzepatide) Bydureon Bcise (exanatide extended release)  DO NOT TAKE 1 DAY PRIOR TO YOUR TEST Rybelsus (semaglutide) Adlyxin (lixisenatide) Victoza (liraglutide) Byetta (exanatide) ___________________________________________________________________________  Your provider has requested that you go to the basement level for lab work before leaving today. Press "B" on the elevator. The lab is located at the first door on the left as you exit the elevator.      Due to recent changes in healthcare laws, you may see the results of your imaging and laboratory studies on MyChart before your provider has had a chance to review them.  We understand that in some cases there may be results that are confusing or concerning to you. Not all laboratory results come back in the same time frame and the provider may be waiting for multiple results in order to interpret others.  Please give [place] 48 hours in order for your provider to thoroughly review all the results before contacting the office for clarification of your results.    _______________________________________________________  If your blood pressure at your visit was 140/90 or greater, please contact your primary care physician to follow up on this.  _______________________________________________________  If you are age 58 or older, your body mass index should be between 23-30. Your Body mass index is 25.41 kg/m. If this is out of the aforementioned range listed, please consider follow up with your Primary Care Provider.  If you  are age 27 or younger, your body mass index should be between 19-25. Your Body mass index is 25.41 kg/m. If this is out of the aformentioned range listed, please consider follow up with your Primary Care Provider.   ________________________________________________________  The [facility] providers would like to encourage you to use [facility] to communicate with providers for non-urgent requests or questions.  Due to long hold times on the telephone, sending your provider a message by [facility] may be a faster and more efficient way to get a response.  Please allow 48 business hours for a response.  Please remember that this is for non-urgent requests.  _______________________________________________________   Follow up per recommendations after procedure   I appreciate the  opportunity to care for you  Thank You   Jacelyn Grip

## 2023-06-09 NOTE — Progress Notes (Signed)
Chief Complaint: Hospital follow-up for GI bleed  HPI:    Carol Love is a 72 year old female with a past medical history as listed below including CAD on Aspirin, anxiety, depression, hyperlipidemia, hypertension and multiple others, known to Dr. Lavon Paganini, who presents to clinic today for follow-up after being seen in the hospital for GI bleed.    4 /07/2017 colonoscopy with Dr. Lavon Paganini with a 2 mm polyp at the rectosigmoid colon, diverticulosis in the sigmoid colon nonbleeding internal hemorrhoids.    05/16/2023 patient was consulted by our service in the hospital for GI bleed.  Described a black tarry bowel movement at that time.  On arrival BUN 37, alk phos 26, hemoglobin 7.3, INR 1.4 and fecal occult positive, CT angio of the abdomen pelvis was negative.    05/16/2023 EGD with multiple gastric ulcers thought to be the source of recent melena.  Patient continued on Pantoprazole 40 mg p.o. twice daily for the next 8 weeks.  Told to follow-up with repeat EGD in 4 to 6 weeks.  Did have some hypokalemia issues in the hospital.    Today, patient presents clinic accompanied by her husband.  She tells me that she is feeling great.  She is a little sad to have lost the medicine she was taking for back pain because now she is having to wear her brace again but is aware that this may have been causing her ulcers.  She continues on Pantoprazole 40 twice daily for now.  Denies any new complaints or concerns.    Denies fever, chills, weight loss or abdominal pain.  Past Medical History:  Diagnosis Date   Abnormal nuclear stress test 12/08/2018   Anxiety    Arthritis    CAD (coronary artery disease) 08/15/2021   Cervical spondylosis 02/07/2021   Constipation    uses stool softener with stimulant   Coronary artery disease involving native coronary artery of native heart without angina pectoris 03/01/2015   Overview:  2014 ST elevation MI  Formatting of this note might be different from the original.  2014 ST elevation MI   Degenerative spondylolisthesis 03/20/2015   Depression    Disorder of bone 10/27/2014   Disorder of sacroiliac joint 12/19/2015   Encounter for general adult medical examination without abnormal findings 08/15/2021   Essential (primary) hypertension 04/26/2010   Generalized anxiety disorder 01/06/2020   Hardening of the aorta (main artery of the heart) (HCC) 03/01/2015   Overview:  2014 ST elevation MI  Formatting of this note might be different from the original. 2014 ST elevation MI   Headache    last h/a  2001   Hyperglycemia 01/06/2020   Hyperlipidemia    Irregular heart beat    hx - no problems since taking acebutolol   Myocardial infarction [facility]) 2014   stent   Nicotine dependence 01/06/2020   Overweight 01/06/2020   Psoriasis    Pulmonary emphysema (HCC) 05/15/2023   Recurrent major depression (HCC) 09/26/2021   Spinal stenosis of lumbar region 05/10/2010   Spondylolisthesis 10/27/2014   SVD (spontaneous vaginal delivery)    x 3    Past Surgical History:  Procedure Laterality Date   APPENDECTOMY  05   BIOPSY  05/16/2023   Procedure: BIOPSY;  Surgeon: Sherrilyn Rist, MD;  Location: [facility] ENDOSCOPY;  Service: Gastroenterology;;   CARDIAC CATHETERIZATION  2014   stent   CARDIOVASCULAR STRESS TEST  03/07/15   COLONOSCOPY     CORONARY ANGIOPLASTY     DES RCA  12/13/12   CORONARY STENT PLACEMENT     DILATION AND CURETTAGE OF UTERUS     ESOPHAGOGASTRODUODENOSCOPY (EGD) WITH PROPOFOL N/A 05/16/2023   Procedure: ESOPHAGOGASTRODUODENOSCOPY (EGD) WITH PROPOFOL;  Surgeon: Sherrilyn Rist, MD;  Location: [facility] ENDOSCOPY;  Service: Gastroenterology;  Laterality: N/A;   HEMOSTASIS CONTROL  05/16/2023   Procedure: HEMOSTASIS CONTROL;  Surgeon: Sherrilyn Rist, MD;  Location: [facility] ENDOSCOPY;  Service: Gastroenterology;;   LEFT HEART CATH AND CORONARY ANGIOGRAPHY N/A 12/10/2018   Procedure: LEFT HEART CATH AND CORONARY ANGIOGRAPHY;  Surgeon: Lyn Records, MD;   Location: [facility] INVASIVE CV LAB;  Service: Cardiovascular;  Laterality: N/A;   MAXIMUM ACCESS (MAS)POSTERIOR LUMBAR INTERBODY FUSION (PLIF) 1 LEVEL N/A 03/20/2015   Procedure: FOR MAXIMUM ACCESS (MAS) POSTERIOR LUMBAR INTERBODY FUSION (PLIF) 1 LEVEL;  Surgeon: Julio Sicks, MD;  Location: [facility] NEURO ORS;  Service: Neurosurgery;  Laterality: N/A;  FOR MAXIMUM ACCESS (MAS) POSTERIOR LUMBAR INTERBODY FUSION (PLIF) 1 LEVEL LUMBAR FOUR-FIVE   NASAL SEPTUM SURGERY  1985   s/p fall   SHOULDER ARTHROSCOPY Left 08   SUBMUCOSAL INJECTION  05/16/2023   Procedure: SUBMUCOSAL INJECTION;  Surgeon: Sherrilyn Rist, MD;  Location: [facility] ENDOSCOPY;  Service: Gastroenterology;;   TONSILLECTOMY     TOOTH EXTRACTION     4 teeth pulled prior to braces as a child   WISDOM TOOTH EXTRACTION      Current Outpatient Medications  Medication Sig Dispense Refill   acebutolol (SECTRAL) 200 MG capsule Take 1 capsule (200 mg total) by mouth daily. 90 capsule 3   ALPRAZolam (XANAX) 0.5 MG tablet Take 0.5 mg by mouth in the morning. And take 1 mg in the evening  5   amitriptyline (ELAVIL) 50 MG tablet Take 50 mg by mouth at bedtime.  12   amLODipine (NORVASC) 5 MG tablet Take 1 tablet (5 mg total) by mouth daily.     ARIPiprazole (ABILIFY) 5 MG tablet Take 5 mg by mouth at bedtime.     atorvastatin (LIPITOR) 40 MG tablet Take 1 tablet (40 mg total) by mouth every evening. Patient needs an appointment for further refills. 2 nd attempt 15 tablet 0   busPIRone (BUSPAR) 15 MG tablet Take 15 mg by mouth 2 (two) times daily.     Calcium Citrate-Vitamin D (CALCIUM + D PO) Take 1 tablet by mouth daily.     DULoxetine (CYMBALTA) 30 MG capsule Take 30 mg by mouth daily. Take with     DULoxetine (CYMBALTA) 60 MG capsule Take 60 mg by mouth daily.     Multiple Vitamin (MULTIVITAMIN) capsule Take 1 capsule by mouth daily.     [START ON 06/16/2023] pantoprazole (PROTONIX) 40 MG tablet Take 1 tablet (40 mg total) by mouth 2 (two) times daily. 60  tablet 2   Polyethylene Glycol 3350 (MIRALAX PO) Take 1 Scoop by mouth daily as needed for constipation.     No current facility-administered medications for this visit.    Allergies as of 06/09/2023 - Review Complete 06/09/2023  Allergen Reaction Noted   Epinephrine Other (See Comments) 11/19/2018   Ibuprofen Itching and Swelling 03/07/2015   Penicillins Rash 03/07/2015   Sulfa antibiotics Rash 03/07/2015    Family History  Problem Relation Age of Onset   Heart disease Mother    Hyperlipidemia Mother    AAA (abdominal aortic aneurysm) Mother    Lung cancer Mother    Heart disease Father    Colon cancer Neg Hx  Colon polyps Neg Hx    Esophageal cancer Neg Hx    Rectal cancer Neg Hx    Stomach cancer Neg Hx     Social History   Socioeconomic History   Marital status: Married    Spouse name: Not on file   Number of children: 3   Years of education: Not on file   Highest education level: Not on file  Occupational History   Not on file  Tobacco Use   Smoking status: Former    Current packs/day: 0.00    Average packs/day: 0.5 packs/day for 45.0 years (22.5 ttl pk-yrs)    Types: Cigarettes    Start date: 10/10/1967    Quit date: 10/09/2012    Years since quitting: 10.6   Smokeless tobacco: Never  Vaping Use   Vaping status: Never Used  Substance and Sexual Activity   Alcohol use: Yes    Alcohol/week: 21.0 standard drinks of alcohol    Types: 21 Glasses of wine per week    Comment: several times a week- 1-2 glasses of wine    Drug use: No   Sexual activity: Not on file  Other Topics Concern   Not on file  Social History Narrative   Not on file   Social Determinants of Health   Financial Resource Strain: Not on file  Food Insecurity: No Food Insecurity (05/16/2023)   Hunger Vital Sign    Worried About Running Out of Food in the Last Year: Never true    Ran Out of Food in the Last Year: Never true  Transportation Needs: No Transportation Needs (05/16/2023)    PRAPARE - Administrator, Civil Service (Medical): No    Lack of Transportation (Non-Medical): No  Physical Activity: Not on file  Stress: Not on file  Social Connections: Not on file  Intimate Partner Violence: Not At Risk (05/16/2023)   Humiliation, Afraid, Rape, and Kick questionnaire    Fear of Current or Ex-Partner: No    Emotionally Abused: No    Physically Abused: No    Sexually Abused: No    Review of Systems:    Constitutional: No weight loss, fever or chills Cardiovascular: No chest pain Respiratory: No SOB  Gastrointestinal: See HPI and otherwise negative   Physical Exam:  Vital signs: BP 120/78   Ht 4\' 11"  (1.499 m)   Wt 125 lb 12.8 oz (57.1 kg)   LMP  (LMP Unknown)   BMI 25.41 kg/m    Constitutional:   Pleasant Caucasian female appears to be in NAD, Well developed, Well nourished, alert and cooperative Respiratory: Respirations even and unlabored. Lungs clear to auscultation bilaterally.   No wheezes, crackles, or rhonchi.  Cardiovascular: Normal S1, S2. No MRG. Regular rate and rhythm. No peripheral edema, cyanosis or pallor.  Gastrointestinal:  Soft, nondistended, nontender. No rebound or guarding. Normal bowel sounds. No appreciable masses or hepatomegaly. Rectal:  Not performed.  Psychiatric: Demonstrates good judgement and reason without abnormal affect or behaviors.  RELEVANT LABS AND IMAGING: CBC    Component Value Date/Time   WBC 7.4 05/17/2023 0317   RBC 2.35 (L) 05/17/2023 0317   HGB 7.9 (L) 05/17/2023 0317   HCT 22.4 (L) 05/17/2023 0317   PLT 115 (L) 05/17/2023 0317   MCV 95.3 05/17/2023 0317   MCH 33.6 05/17/2023 0317   MCHC 35.3 05/17/2023 0317   RDW 14.6 05/17/2023 0317   LYMPHSABS 1.0 05/16/2023 1022   MONOABS 0.4 05/16/2023 1022   EOSABS 0.0 05/16/2023  1022   BASOSABS 0.0 05/16/2023 1022    CMP     Component Value Date/Time   NA 137 05/17/2023 0317   K 2.9 (L) 05/17/2023 0317   CL 109 05/17/2023 0317   CO2 23  05/17/2023 0317   GLUCOSE 96 05/17/2023 0317   BUN 14 05/17/2023 0317   CREATININE 0.58 05/17/2023 0317   CALCIUM 7.1 (L) 05/17/2023 0317   PROT 4.3 (L) 05/17/2023 0317   PROT 6.6 11/30/2018 1231   ALBUMIN 2.8 (L) 05/17/2023 0317   ALBUMIN 4.7 11/30/2018 1231   AST 19 05/17/2023 0317   ALT 17 05/17/2023 0317   ALKPHOS 29 (L) 05/17/2023 0317   BILITOT 0.6 05/17/2023 0317   BILITOT 0.6 11/30/2018 1231   GFRNONAA >60 05/17/2023 0317   GFRAA >60 02/17/2019 1430    Assessment: 1.  History of gastric ulcers with bleeding: Patient doing better now, no further signs of GI bleeding per her, continues on Pantoprazole 40 twice daily, no recent labs 2.  IDA: Related to above  Plan: 1.  Scheduled patient for repeat EGD given history of gastric ulcers with Dr. Lavon Paganini.  Did provide the patient a detailed list of risks for the procedure and she agrees to proceed. Patient is appropriate for endoscopic procedure(s) in the ambulatory ([facility]) setting.  2.  Continue Pantoprazole 40 twice daily for now #60 with 3 refills 3.  Recheck CBC and CMP today as well as iron studies. 4.  Patient to follow in clinic per recommendations after repeat EGD.  Hyacinth Meeker, PA-C [facility] 06/09/2023, 11:17 AM  Cc: Melida Quitter, MD

## 2023-06-16 ENCOUNTER — Other Ambulatory Visit: Payer: Self-pay

## 2023-06-17 ENCOUNTER — Encounter: Payer: Self-pay | Admitting: Gastroenterology

## 2023-06-19 DIAGNOSIS — M5416 Radiculopathy, lumbar region: Secondary | ICD-10-CM | POA: Diagnosis not present

## 2023-06-19 DIAGNOSIS — M4316 Spondylolisthesis, lumbar region: Secondary | ICD-10-CM | POA: Diagnosis not present

## 2023-06-19 DIAGNOSIS — K080 Exfoliation of teeth due to systemic causes: Secondary | ICD-10-CM | POA: Diagnosis not present

## 2023-06-23 ENCOUNTER — Other Ambulatory Visit: Payer: Self-pay | Admitting: Cardiology

## 2023-06-23 NOTE — Telephone Encounter (Signed)
Rx refill sent to pharmacy. 

## 2023-06-30 ENCOUNTER — Ambulatory Visit: Payer: Medicare Other | Admitting: Gastroenterology

## 2023-06-30 ENCOUNTER — Encounter: Payer: Self-pay | Admitting: Gastroenterology

## 2023-06-30 VITALS — BP 136/76 | HR 60 | Temp 98.6°F | Resp 12 | Ht 59.0 in | Wt 125.0 lb

## 2023-06-30 DIAGNOSIS — K259 Gastric ulcer, unspecified as acute or chronic, without hemorrhage or perforation: Secondary | ICD-10-CM | POA: Diagnosis not present

## 2023-06-30 DIAGNOSIS — K2970 Gastritis, unspecified, without bleeding: Secondary | ICD-10-CM | POA: Diagnosis not present

## 2023-06-30 DIAGNOSIS — Z8711 Personal history of peptic ulcer disease: Secondary | ICD-10-CM

## 2023-06-30 DIAGNOSIS — K3189 Other diseases of stomach and duodenum: Secondary | ICD-10-CM | POA: Diagnosis not present

## 2023-06-30 MED ORDER — SODIUM CHLORIDE 0.9 % IV SOLN: 500.0000 mL | INTRAVENOUS | Status: DC

## 2023-06-30 NOTE — Progress Notes (Unsigned)
Gastroenterology History and Physical   Primary Care Physician:  Melida Quitter, MD   Reason for Procedure:  Follow up of gastric ulcer, iron def anemia  Plan:    EGD with possible interventions as needed     HPI: Carol Love is a very pleasant 72 y.o. female here for EGD for follow up of gastric ulcer.  The risks and benefits as well as alternatives of endoscopic procedure(s) have been discussed and reviewed. All questions answered. The patient agrees to proceed.    Past Medical History:  Diagnosis Date   Abnormal nuclear stress test 12/08/2018   Anxiety    Arthritis    CAD (coronary artery disease) 08/15/2021   Cervical spondylosis 02/07/2021   Constipation    uses stool softener with stimulant   Coronary artery disease involving native coronary artery of native heart without angina pectoris 03/01/2015   Overview:  2014 ST elevation MI  Formatting of this note might be different from the original. 2014 ST elevation MI   Degenerative spondylolisthesis 03/20/2015   Depression    Disorder of bone 10/27/2014   Disorder of sacroiliac joint 12/19/2015   Encounter for general adult medical examination without abnormal findings 08/15/2021   Essential (primary) hypertension 04/26/2010   Generalized anxiety disorder 01/06/2020   Hardening of the aorta (main artery of the heart) (HCC) 03/01/2015   Overview:  2014 ST elevation MI  Formatting of this note might be different from the original. 2014 ST elevation MI   Headache    last h/a  2001   Hyperglycemia 01/06/2020   Hyperlipidemia    Irregular heart beat    hx - no problems since taking acebutolol   Myocardial infarction [facility]) 2014   stent   Nicotine dependence 01/06/2020   Overweight 01/06/2020   Psoriasis    Pulmonary emphysema (HCC) 05/15/2023   Recurrent major depression (HCC) 09/26/2021   Spinal stenosis of lumbar region 05/10/2010   Spondylolisthesis 10/27/2014   SVD (spontaneous vaginal delivery)    x  3    Past Surgical History:  Procedure Laterality Date   APPENDECTOMY  05   BIOPSY  05/16/2023   Procedure: BIOPSY;  Surgeon: Sherrilyn Rist, MD;  Location: [facility] ENDOSCOPY;  Service: Gastroenterology;;   CARDIAC CATHETERIZATION  2014   stent   CARDIOVASCULAR STRESS TEST  03/07/15   COLONOSCOPY     CORONARY ANGIOPLASTY     DES RCA 12/13/12   CORONARY STENT PLACEMENT     DILATION AND CURETTAGE OF UTERUS     ESOPHAGOGASTRODUODENOSCOPY (EGD) WITH PROPOFOL N/A 05/16/2023   Procedure: ESOPHAGOGASTRODUODENOSCOPY (EGD) WITH PROPOFOL;  Surgeon: Sherrilyn Rist, MD;  Location: [facility] ENDOSCOPY;  Service: Gastroenterology;  Laterality: N/A;   HEMOSTASIS CONTROL  05/16/2023   Procedure: HEMOSTASIS CONTROL;  Surgeon: Sherrilyn Rist, MD;  Location: [facility] ENDOSCOPY;  Service: Gastroenterology;;   LEFT HEART CATH AND CORONARY ANGIOGRAPHY N/A 12/10/2018   Procedure: LEFT HEART CATH AND CORONARY ANGIOGRAPHY;  Surgeon: Lyn Records, MD;  [facility]: [facility] INVASIVE CV LAB;  Service: Cardiovascular;  Laterality: N/A;   MAXIMUM ACCESS (MAS)POSTERIOR LUMBAR INTERBODY FUSION (PLIF) 1 LEVEL N/A 03/20/2015   Procedure: FOR MAXIMUM ACCESS (MAS) POSTERIOR LUMBAR INTERBODY FUSION (PLIF) 1 LEVEL;  Surgeon: Julio Sicks, MD;  Location: [facility] NEURO ORS;  Service: Neurosurgery;  Laterality: N/A;  FOR MAXIMUM ACCESS (MAS) POSTERIOR LUMBAR INTERBODY FUSION (PLIF) 1 LEVEL LUMBAR FOUR-FIVE   NASAL SEPTUM SURGERY  1985   s/p fall   SHOULDER  ARTHROSCOPY Left 08   SUBMUCOSAL INJECTION  05/16/2023   Procedure: SUBMUCOSAL INJECTION;  Surgeon: Sherrilyn Rist, MD;  Location: [facility] ENDOSCOPY;  Service: Gastroenterology;;   TONSILLECTOMY     TOOTH EXTRACTION     4 teeth pulled prior to braces as a child   WISDOM TOOTH EXTRACTION      Prior to Admission medications   Medication Sig Start Date End Date Taking? Authorizing Provider  ACCRUFER 30 MG CAPS Take 1 capsule by mouth 2 (two) times daily.   Yes [provider]  acebutolol  (SECTRAL) 200 MG capsule Take 1 capsule (200 mg total) by mouth daily. 08/23/20  Yes Revankar, Aundra Dubin, MD  amitriptyline (ELAVIL) 50 MG tablet Take 50 mg by mouth at bedtime. 12/20/14  Yes [provider]  amLODipine (NORVASC) 5 MG tablet Take 1 tablet (5 mg total) by mouth daily. 05/21/23  Yes Elgergawy, Leana Roe, MD  ARIPiprazole (ABILIFY) 5 MG tablet Take 5 mg by mouth at bedtime. 05/08/23  Yes [provider]  atorvastatin (LIPITOR) 40 MG tablet TAKE 1 TABLET BY MOUTH DAILY IN THE EVENING 06/23/23  Yes Revankar, Aundra Dubin, MD  busPIRone (BUSPAR) 15 MG tablet Take 15 mg by mouth 2 (two) times daily. 09/25/21  Yes [provider]  Calcium Citrate-Vitamin D (CALCIUM + D PO) Take 1 tablet by mouth daily.   Yes [provider]  DULoxetine (CYMBALTA) 30 MG capsule Take 30 mg by mouth daily. Take with   Yes [provider]  DULoxetine (CYMBALTA) 60 MG capsule Take 60 mg by mouth daily. 08/17/21  Yes [provider]  Multiple Vitamin (MULTIVITAMIN) capsule Take 1 capsule by mouth daily.   Yes [provider]  pantoprazole (PROTONIX) 40 MG tablet Take 1 tablet (40 mg total) by mouth 2 (two) times daily. 06/16/23 09/14/23 Yes Elgergawy, Leana Roe, MD  ALPRAZolam Prudy Feeler) 0.5 MG tablet Take 0.5 mg by mouth in the morning. And take 1 mg in the evening 01/17/15   [provider]  meloxicam (MOBIC) 15 MG tablet 1 tablet by mouth daily as needed    [provider]  Polyethylene Glycol 3350 (MIRALAX PO) Take 1 Scoop by mouth daily as needed for constipation. Patient not taking: Reported on 06/30/2023    [provider]  traMADol (ULTRAM) 50 MG tablet Take 1 tablet every 6 hours by oral route as needed. 06/19/23   [provider]    Current Outpatient Medications  Medication Sig Dispense Refill   ACCRUFER 30 MG CAPS Take 1 capsule by mouth 2 (two) times daily.     acebutolol (SECTRAL) 200 MG capsule Take 1 capsule (200 mg  total) by mouth daily. 90 capsule 3   amitriptyline (ELAVIL) 50 MG tablet Take 50 mg by mouth at bedtime.  12   amLODipine (NORVASC) 5 MG tablet Take 1 tablet (5 mg total) by mouth daily.     ARIPiprazole (ABILIFY) 5 MG tablet Take 5 mg by mouth at bedtime.     atorvastatin (LIPITOR) 40 MG tablet TAKE 1 TABLET BY MOUTH DAILY IN THE EVENING 15 tablet 0   busPIRone (BUSPAR) 15 MG tablet Take 15 mg by mouth 2 (two) times daily.     Calcium Citrate-Vitamin D (CALCIUM + D PO) Take 1 tablet by mouth daily.     DULoxetine (CYMBALTA) 30 MG capsule Take 30 mg by mouth daily. Take with     DULoxetine (CYMBALTA) 60 MG capsule Take 60 mg by mouth daily.  Multiple Vitamin (MULTIVITAMIN) capsule Take 1 capsule by mouth daily.     pantoprazole (PROTONIX) 40 MG tablet Take 1 tablet (40 mg total) by mouth 2 (two) times daily. 60 tablet 2   ALPRAZolam (XANAX) 0.5 MG tablet Take 0.5 mg by mouth in the morning. And take 1 mg in the evening  5   meloxicam (MOBIC) 15 MG tablet 1 tablet by mouth daily as needed     Polyethylene Glycol 3350 (MIRALAX PO) Take 1 Scoop by mouth daily as needed for constipation. (Patient not taking: Reported on 06/30/2023)     traMADol (ULTRAM) 50 MG tablet Take 1 tablet every 6 hours by oral route as needed.     Current Facility-Administered Medications  Medication Dose Route Frequency Provider Last Rate Last Admin   0.9 %  sodium chloride infusion  500 mL Intravenous Continuous Braylie Badami, Eleonore Chiquito, MD        Allergies as of 06/30/2023 - Review Complete 06/30/2023  Allergen Reaction Noted   Epinephrine Other (See Comments) 11/19/2018   Ibuprofen Itching and Swelling 03/07/2015   Penicillins Rash 03/07/2015   Sulfa antibiotics Rash 03/07/2015    Family History  Problem Relation Age of Onset   Heart disease Mother    Hyperlipidemia Mother    AAA (abdominal aortic aneurysm) Mother    Lung cancer Mother    Heart disease Father    Colon cancer Neg Hx    Colon polyps Neg Hx     Esophageal cancer Neg Hx    Rectal cancer Neg Hx    Stomach cancer Neg Hx     Social History   Socioeconomic History   Marital status: Married    Spouse name: Not on file   Number of children: 3   Years of education: Not on file   Highest education level: Not on file  Occupational History   Not on file  Tobacco Use   Smoking status: Former    Current packs/day: 0.00    Average packs/day: 0.5 packs/day for 45.0 years (22.5 ttl pk-yrs)    Types: Cigarettes    Start date: 10/10/1967    Quit date: 10/09/2012    Years since quitting: 10.7   Smokeless tobacco: Never  Vaping Use   Vaping status: Never Used  Substance and Sexual Activity   Alcohol use: Not Currently   Drug use: No   Sexual activity: Not on file  Other Topics Concern   Not on file  Social History Narrative   Not on file   Social Determinants of Health   Financial Resource Strain: Not on file  Food Insecurity: No Food Insecurity (05/16/2023)   Hunger Vital Sign    Worried About Running Out of Food in the Last Year: Never true    Ran Out of Food in the Last Year: Never true  Transportation Needs: No Transportation Needs (05/16/2023)   PRAPARE - Administrator, Civil Service (Medical): No    Lack of Transportation (Non-Medical): No  Physical Activity: Not on file  Stress: Not on file  Social Connections: Not on file  Intimate Partner Violence: Not At Risk (05/16/2023)   Humiliation, Afraid, Rape, and Kick questionnaire    Fear of Current or Ex-Partner: No    Emotionally Abused: No    Physically Abused: No    Sexually Abused: No    Review of Systems:  All other review of systems negative except as mentioned in the HPI.  Physical Exam: Vital signs in last 24  hours: BP (!) 141/83   Pulse 63   Temp 98.6 F (37 C) (Skin)   Ht 4\' 11"  (1.499 m)   Wt 125 lb (56.7 kg)   LMP  (LMP Unknown)   SpO2 97%   BMI 25.25 kg/m  General:   Alert, NAD Lungs:  Clear .   Heart:  Regular rate and  rhythm Abdomen:  Soft, nontender and nondistended. Neuro/Psych:  Alert and cooperative. Normal mood and affect. A and O x 3  Reviewed labs, radiology imaging, old records and pertinent past GI work up  Patient is appropriate for planned procedure(s) and anesthesia in an ambulatory setting   K. Scherry Ran , MD [phone]

## 2023-06-30 NOTE — Progress Notes (Signed)
Pt resting comfortably. VSS. Airway intact. SBAR complete to RN. All questions answered.   

## 2023-06-30 NOTE — Progress Notes (Unsigned)
Called to room to assist during endoscopic procedure.  Patient ID and intended procedure confirmed with present staff. Received instructions for my participation in the procedure from the performing physician.  

## 2023-06-30 NOTE — Patient Instructions (Signed)
Thank you for letting us take care of your healthcare needs today. Please see handouts given to you on Gastritis. Continue Pantoprazole 40 mg twice a day for the next 2 months then decrease to daily.    YOU HAD AN ENDOSCOPIC PROCEDURE TODAY AT THE [facility]:   Refer to the procedure report that was given to you for any specific questions about what was found during the examination.  If the procedure report does not answer your questions, please call your gastroenterologist to clarify.  If you requested that your care partner not be given the details of your procedure findings, then the procedure report has been included in a sealed envelope for you to review at your convenience later.  YOU SHOULD EXPECT: Some feelings of bloating in the abdomen. Passage of more gas than usual.  Walking can help get rid of the air that was put into your GI tract during the procedure and reduce the bloating. If you had a lower endoscopy (such as a colonoscopy or flexible sigmoidoscopy) you may notice spotting of blood in your stool or on the toilet paper. If you underwent a bowel prep for your procedure, you may not have a normal bowel movement for a few days.  Please Note:  You might notice some irritation and congestion in your nose or some drainage.  This is from the oxygen used during your procedure.  There is no need for concern and it should clear up in a day or so.  SYMPTOMS TO REPORT IMMEDIATELY:  Following upper endoscopy (EGD)  Vomiting of blood or coffee ground material  New chest pain or pain under the shoulder blades  Painful or persistently difficult swallowing  New shortness of breath  Fever of 100F or higher  Black, tarry-looking stools  For urgent or emergent issues, a gastroenterologist can be reached at any hour by calling [phone]. Do not use MyChart messaging for urgent concerns.    DIET:  We do recommend a small meal at first, but then you may proceed to your  regular diet.  Drink plenty of fluids but you should avoid alcoholic beverages for 24 hours.  ACTIVITY:  You should plan to take it easy for the rest of today and you should NOT DRIVE or use heavy machinery until tomorrow (because of the sedation medicines used during the test).    FOLLOW UP: Our staff will call the number listed on your records the next business day following your procedure.  We will call around 7:15- 8:00 am to check on you and address any questions or concerns that you may have regarding the information given to you following your procedure. If we do not reach you, we will leave a message.     If any biopsies were taken you will be contacted by phone or by letter within the next 1-3 weeks.  Please call us at [phone] if you have not heard about the biopsies in 3 weeks.    SIGNATURES/CONFIDENTIALITY: You and/or your care partner have signed paperwork which will be entered into your electronic medical record.  These signatures attest to the fact that that the information above on your After Visit Summary has been reviewed and is understood.  Full responsibility of the confidentiality of this discharge information lies with you and/or your care-partner.

## 2023-06-30 NOTE — Op Note (Addendum)
[facility] Patient Name: Carol Love Procedure Date: 06/30/2023 3:34 PM MRN: [id number] Endoscopist: Napoleon Form , MD, [id number] Age: 72 Referring MD:  Date of Birth: Jun 02, 1951 Gender: Female Account #: [id number] Procedure:                Upper GI endoscopy Indications:              Follow-up of acute gastric ulcer with hemorrhage Medicines:                Monitored Anesthesia Care Procedure:                Pre-Anesthesia Assessment:                           - Prior to the procedure, a History and Physical                            was performed, and patient medications and                            allergies were reviewed. The patient's tolerance of                            previous anesthesia was also reviewed. The risks                            and benefits of the procedure and the sedation                            options and risks were discussed with the patient.                            All questions were answered, and informed consent                            was obtained. Prior Anticoagulants: The patient has                            taken no anticoagulant or antiplatelet agents. ASA                            Grade Assessment: III - A patient with severe                            systemic disease. After reviewing the risks and                            benefits, the patient was deemed in satisfactory                            condition to undergo the procedure.                           After obtaining informed consent, the endoscope was  passed under direct vision. Throughout the                            procedure, the patient's blood pressure, pulse, and                            oxygen saturations were monitored continuously. The                            Olympus Scope [id number] was introduced through the                            mouth, and advanced to the second part of duodenum.                             The upper GI endoscopy was accomplished without                            difficulty. The patient tolerated the procedure                            well. Scope In: Scope Out: Findings:                 The Z-line was regular and was found 38 cm from the                            incisors.                           The exam of the esophagus was otherwise normal.                           One non-bleeding cratered gastric ulcer with no                            stigmata of bleeding was found in the gastric                            antrum. The lesion was 10 mm in largest dimension.                            Biopsies were taken with a cold forceps for                            histology.                           Patchy mild inflammation characterized by                            congestion (edema), erythema and friability was                            found in the entire examined stomach. Biopsies were  taken with a cold forceps for Helicobacter pylori                            testing.                           The cardia and gastric fundus were normal on                            retroflexion.                           The examined duodenum was normal. Complications:            No immediate complications. Estimated Blood Loss:     Estimated blood loss was minimal. Impression:               - Z-line regular, 38 cm from the incisors.                           - Non-bleeding gastric ulcer with no stigmata of                            bleeding. Biopsied.                           - Gastritis. Biopsied.                           - Normal examined duodenum. Recommendation:           - Patient has a contact number available for                            emergencies. The signs and symptoms of potential                            delayed complications were discussed with the                            patient. Return to normal activities tomorrow.                             Written discharge instructions were provided to the                            patient.                           - Resume previous diet.                           - Continue present medications.                           - Continue Pantoprazole 40mg  BID for additional 2  months and then decrease to once daily                           - Await pathology results.                           - No ibuprofen, naproxen, or other non-steroidal                            anti-inflammatory drugs. Napoleon Form, MD 06/30/2023 3:56:46 PM This report has been signed electronically.

## 2023-07-01 ENCOUNTER — Telehealth: Payer: Self-pay

## 2023-07-01 NOTE — Telephone Encounter (Signed)
No answer, unable to leave a message, B.Furqan Gosselin RN. 

## 2023-07-03 LAB — SURGICAL PATHOLOGY

## 2023-07-09 ENCOUNTER — Other Ambulatory Visit: Payer: Self-pay | Admitting: Cardiology

## 2023-07-11 DIAGNOSIS — F330 Major depressive disorder, recurrent, mild: Secondary | ICD-10-CM | POA: Diagnosis not present

## 2023-07-11 DIAGNOSIS — F419 Anxiety disorder, unspecified: Secondary | ICD-10-CM | POA: Diagnosis not present

## 2023-07-23 ENCOUNTER — Encounter: Payer: Self-pay | Admitting: Gastroenterology

## 2023-07-24 ENCOUNTER — Other Ambulatory Visit: Payer: Self-pay | Admitting: Cardiology

## 2023-08-01 ENCOUNTER — Other Ambulatory Visit: Payer: Self-pay

## 2023-08-01 ENCOUNTER — Telehealth: Payer: Self-pay | Admitting: Cardiology

## 2023-08-01 IMAGING — CT CT CHEST LUNG CANCER SCREENING LOW DOSE W/O CM
1 series · 10 of 10 positions shown, 13 images · non-contrast
Comparison: 09/11/2020

CLINICAL DATA: Former asymptomatic smoker. Forty-six pack-year
history.

EXAM:
CT CHEST WITHOUT CONTRAST LOW-DOSE FOR LUNG CANCER SCREENING
TECHNIQUE: Multidetector CT imaging of the chest was performed following the
standard protocol without IV contrast.

[ct lung segmentation data · axial · 0.69mm/px · z∈[+737,+737]mm · 10 of 303 frames shown]
[frame 1/303  mediastinal]
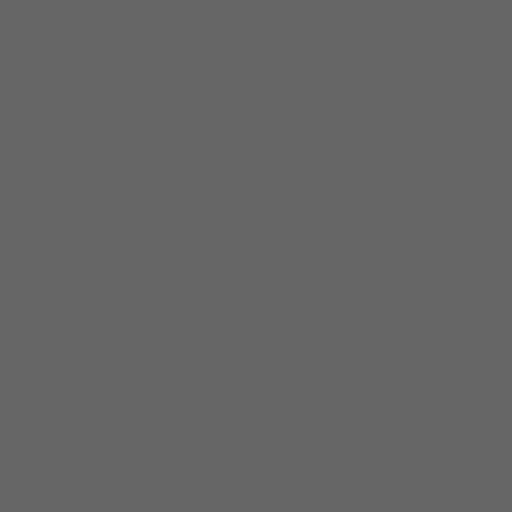
[frame 1/303  lung]
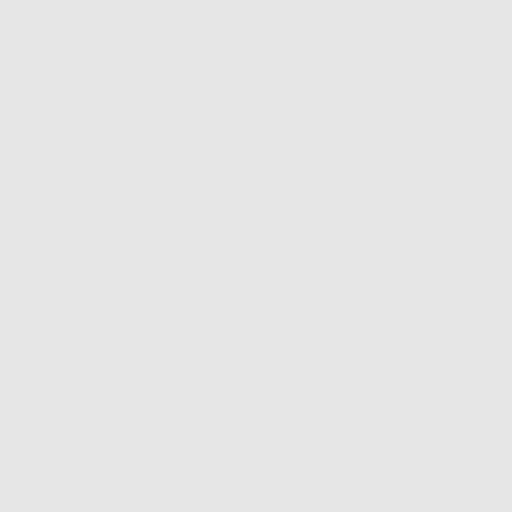
[frame 34/303  lung]
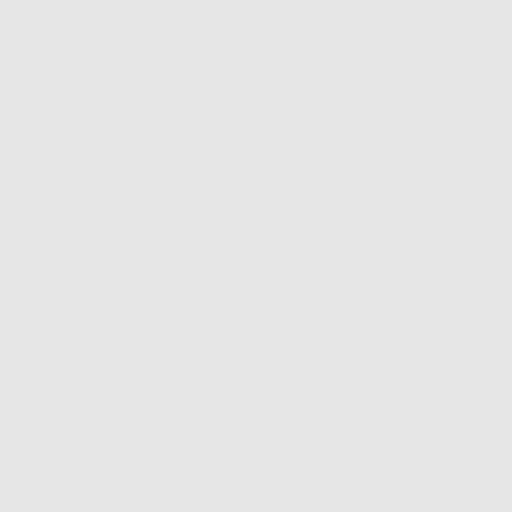
[frame 68/303  lung]
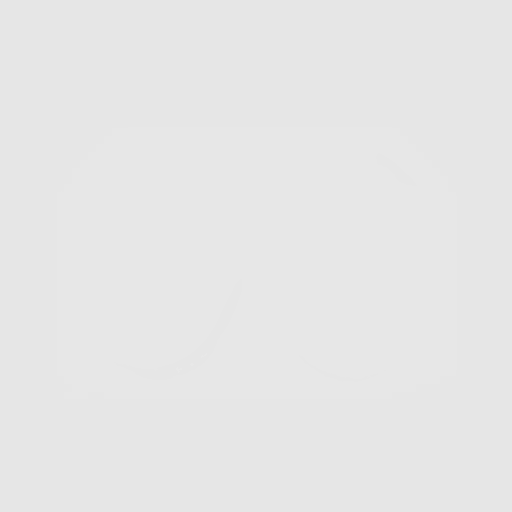
[frame 101/303  lung]
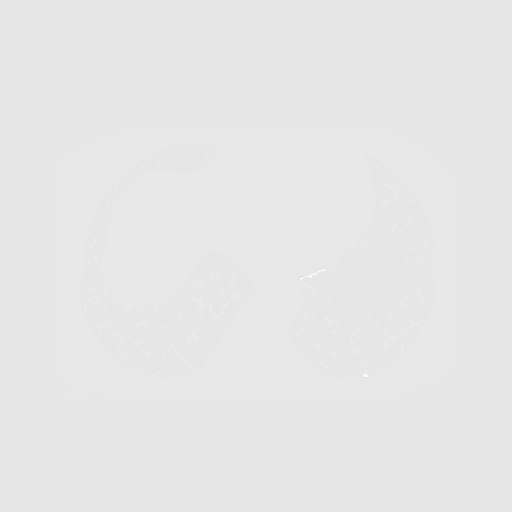
[frame 135/303  mediastinal]
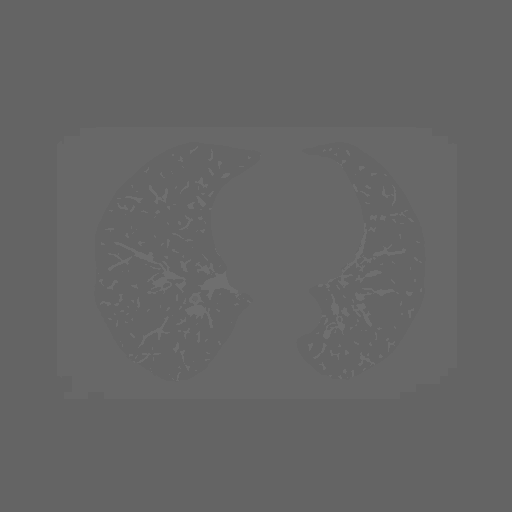
[frame 135/303  lung]
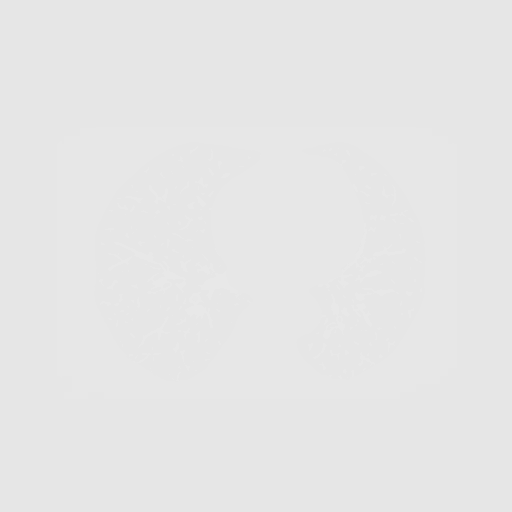
[frame 168/303  lung]
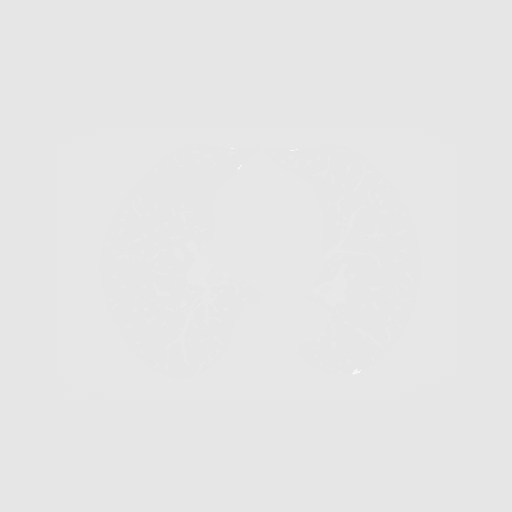
[frame 202/303  lung]
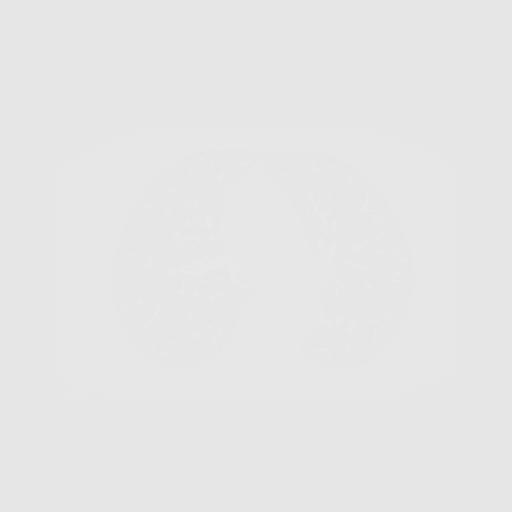
[frame 235/303  lung]
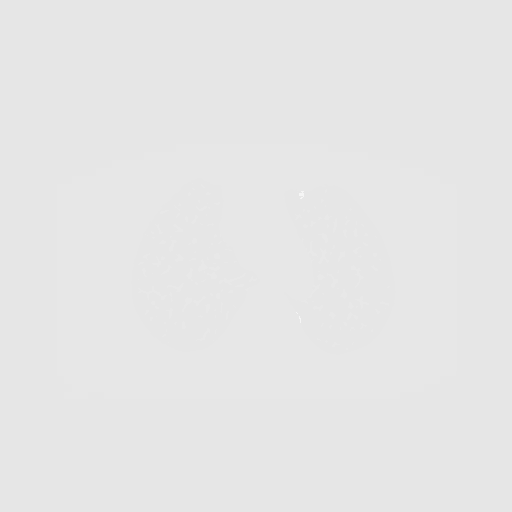
[frame 269/303  mediastinal]
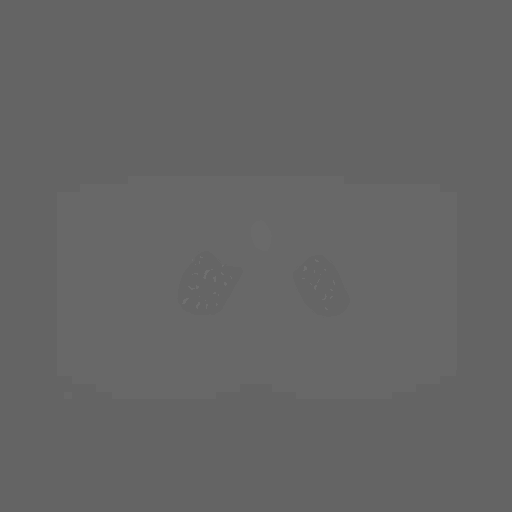
[frame 269/303  lung]
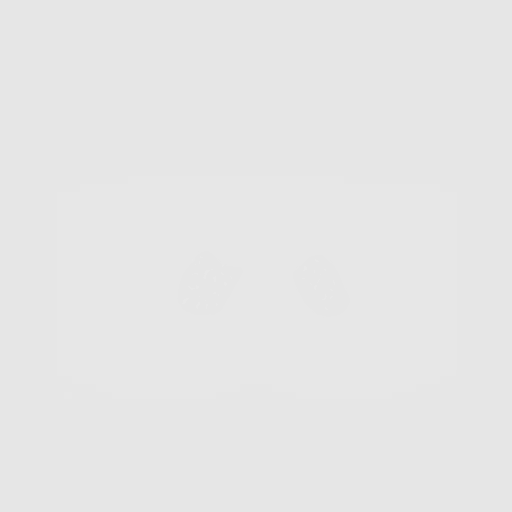
[frame 303/303  lung]
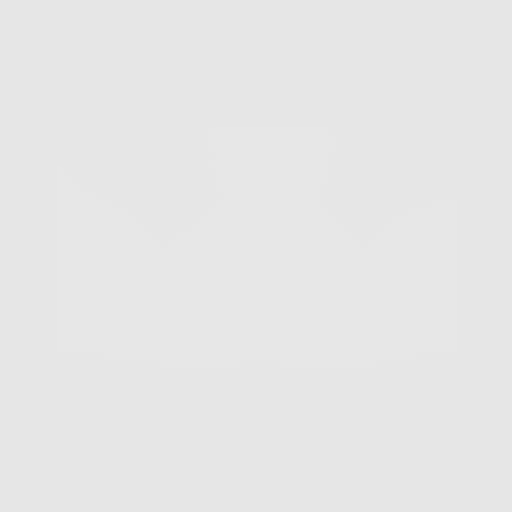

[10 of 10 positions shown; findings below may reference images not displayed]

FINDINGS: Cardiovascular: The heart size appears within normal limits. Aortic
atherosclerosis and coronary artery calcifications. No pericardial
effusion identified.

Mediastinum/Nodes: No enlarged mediastinal, hilar, or axillary lymph
nodes. Thyroid gland, trachea, and esophagus demonstrate no
significant findings.

Lungs/Pleura: There is no pleural effusion, airspace consolidation,
or pneumothorax. No areas of atelectasis identified. There are small
scattered lung nodules identified bilaterally, unchanged from
previous exam. These measure up to 3.5 mm. No new suspicious lung
nodules.

Upper Abdomen: No acute abnormality.

Musculoskeletal: No chest wall mass or suspicious bone lesions
identified.
IMPRESSION: 1. Lung-RADS 2, benign appearance or behavior. Continue annual
screening with low-dose chest CT without contrast in 12 months.
2. Aortic Atherosclerosis ([id number].E).

## 2023-08-01 MED ORDER — ATORVASTATIN CALCIUM 40 MG PO TABS: 40.0000 mg | ORAL_TABLET | Freq: Every evening | ORAL | 0 refills | Status: DC

## 2023-08-01 NOTE — Telephone Encounter (Signed)
*  STAT* If patient is at the pharmacy, call can be transferred to refill team.   1. Which medications need to be refilled? (please list name of each medication and dose if known) atorvastatin (LIPITOR) 40 MG tablet   2. Which pharmacy/location (including street and city if local pharmacy) is medication to be sent to?  [facility] - [place], [place] - [address] [place] [address]    3. Do they need a 30 day or 90 day supply? 30

## 2023-08-06 DIAGNOSIS — Z6826 Body mass index (BMI) 26.0-26.9, adult: Secondary | ICD-10-CM | POA: Diagnosis not present

## 2023-08-06 DIAGNOSIS — M47816 Spondylosis without myelopathy or radiculopathy, lumbar region: Secondary | ICD-10-CM | POA: Diagnosis not present

## 2023-08-27 ENCOUNTER — Other Ambulatory Visit: Payer: Self-pay

## 2023-08-27 DIAGNOSIS — M5416 Radiculopathy, lumbar region: Secondary | ICD-10-CM | POA: Insufficient documentation

## 2023-08-27 DIAGNOSIS — M47816 Spondylosis without myelopathy or radiculopathy, lumbar region: Secondary | ICD-10-CM | POA: Insufficient documentation

## 2023-08-27 HISTORY — DX: Radiculopathy, lumbar region: M54.16

## 2023-08-27 HISTORY — DX: Spondylosis without myelopathy or radiculopathy, lumbar region: M47.816

## 2023-08-28 ENCOUNTER — Ambulatory Visit: Payer: Medicare Other | Attending: Cardiology | Admitting: Cardiology

## 2023-08-28 ENCOUNTER — Encounter: Payer: Self-pay | Admitting: Cardiology

## 2023-08-28 VITALS — BP 114/74 | HR 70 | Ht 59.6 in | Wt 136.2 lb

## 2023-08-28 DIAGNOSIS — E782 Mixed hyperlipidemia: Secondary | ICD-10-CM

## 2023-08-28 DIAGNOSIS — I10 Essential (primary) hypertension: Secondary | ICD-10-CM

## 2023-08-28 DIAGNOSIS — I2510 Atherosclerotic heart disease of native coronary artery without angina pectoris: Secondary | ICD-10-CM

## 2023-08-28 HISTORY — DX: Essential (primary) hypertension: I10

## 2023-08-28 MED ORDER — METOPROLOL TARTRATE 100 MG PO TABS
100.0000 mg | ORAL_TABLET | Freq: Once | ORAL | 0 refills | Status: DC
Start: 2023-08-28 — End: 2023-09-25

## 2023-08-28 NOTE — Patient Instructions (Signed)
Medication Instructions:  Your physician recommends that you continue on your current medications as directed. Please refer to the Current Medication list given to you today.   *If you need a refill on your cardiac medications before your next appointment, please call your pharmacy*   Lab Work: Your physician recommends that you have a BMP today in the office.  Your physician recommends that you return for lab work in: 2 months for CMP and fasting lipids You need to have labs done when you are fasting.  You can come Monday through Friday 8:30 am to 12:00 pm and 1:15 to 4:30. You do not need to make an appointment as the order has already been placed.    If you have labs (blood work) drawn today and your tests are completely normal, you will receive your results only by: MyChart Message (if you have MyChart) OR A paper copy in the mail If you have any lab test that is abnormal or we need to change your treatment, we will call you to review the results.    Testing/Procedures:   Your cardiac CT will be scheduled at the following location:   [facility] [address], [address] [place],  [place]  [address]  Main: [phone]  Hold all erectile dysfunction medications at least 3 days (72 hrs) prior to test. (Ie viagra, cialis, sildenafil, tadalafil, etc) We will administer nitroglycerin during this exam.   On the Night Before the Test: Be sure to Drink plenty of water. Do not consume any caffeinated/decaffeinated beverages or chocolate 12 hours prior to your test. Do not take any antihistamines 12 hours prior to your test.   On the Day of the Test: Drink plenty of water until 1 hour prior to the test. Do not eat any food 1 hour prior to test. You may take your regular medications prior to the test.  Take metoprolol (Lopressor) two hours prior to test. HOLD Furosemide/Hydrochlorothiazide morning of the test. FEMALES- please wear  underwire-free bra if available, avoid dresses & tight clothing  After the Test: Drink plenty of water. After receiving IV contrast, you may experience a mild flushed feeling. This is normal. On occasion, you may experience a mild rash up to 24 hours after the test. This is not dangerous. If this occurs, you can take Benadryl 25 mg and increase your fluid intake. If you experience trouble breathing, this can be serious. If it is severe call 911 IMMEDIATELY. If it is mild, please call our office. If you take any of these medications: Glipizide/Metformin, Avandament, Glucavance, please do not take 48 hours after completing test unless otherwise instructed.  We will call to schedule your test 2-4 weeks out understanding that some insurance companies will need an authorization prior to the service being performed.   For non-scheduling related questions, please contact the cardiac imaging nurse navigator should you have any questions/concerns: Rockwell Alexandria, Cardiac Imaging Nurse Navigator Larey Brick, Cardiac Imaging Nurse Navigator [facility] Direct Office Dial: [phone]   For scheduling needs, including cancellations and rescheduling, please call [place], [phone].     Your next appointment:   9 month(s)  The format for your next appointment:   In Person  Provider:   Belva Crome, MD   Other Instructions Cardiac CT Angiogram A cardiac CT angiogram is a procedure to look at the heart and the area around the heart. It may be done to help find the  cause of chest pains or other symptoms of heart disease. During this procedure, a substance called contrast dye is injected into the blood vessels in the area to be checked. A large X-ray machine, called a CT scanner, then takes detailed pictures of the heart and the surrounding area. The procedure is also sometimes called a coronary CT angiogram, coronary artery scanning, or CTA. A cardiac CT angiogram  allows the health care provider to see how well blood is flowing to and from the heart. The health care provider will be able to see if there are any problems, such as: Blockage or narrowing of the coronary arteries in the heart. Fluid around the heart. Signs of weakness or disease in the muscles, valves, and tissues of the heart. Tell a health care provider about: Any allergies you have. This is especially important if you have had a previous allergic reaction to contrast dye. All medicines you are taking, including vitamins, herbs, eye drops, creams, and over-the-counter medicines. Any blood disorders you have. Any surgeries you have had. Any medical conditions you have. Whether you are pregnant or may be pregnant. Any anxiety disorders, chronic pain, or other conditions you have that may increase your stress or prevent you from lying still. What are the risks? Generally, this is a safe procedure. However, problems may occur, including: Bleeding. Infection. Allergic reactions to medicines or dyes. Damage to other structures or organs. Kidney damage from the contrast dye that is used. Increased risk of cancer from radiation exposure. This risk is low. Talk with your health care provider about: The risks and benefits of testing. How you can receive the lowest dose of radiation. What happens before the procedure? Wear comfortable clothing and remove any jewelry, glasses, dentures, and hearing aids. Follow instructions from your health care provider about eating and drinking. This may include: For 12 hours before the procedure -- avoid caffeine. This includes tea, coffee, soda, energy drinks, and diet pills. Drink plenty of water or other fluids that do not have caffeine in them. Being well hydrated can prevent complications. For 4-6 hours before the procedure -- stop eating and drinking. The contrast dye can cause nausea, but this is less likely if your stomach is empty. Ask your health  care provider about changing or stopping your regular medicines. This is especially important if you are taking diabetes medicines, blood thinners, or medicines to treat problems with erections (erectile dysfunction). What happens during the procedure?  Hair on your chest may need to be removed so that small sticky patches called electrodes can be placed on your chest. These will transmit information that helps to monitor your heart during the procedure. An IV will be inserted into one of your veins. You might be given a medicine to control your heart rate during the procedure. This will help to ensure that good images are obtained. You will be asked to lie on an exam table. This table will slide in and out of the CT machine during the procedure. Contrast dye will be injected into the IV. You might feel warm, or you may get a metallic taste in your mouth. You will be given a medicine called nitroglycerin. This will relax or dilate the arteries in your heart. The table that you are lying on will move into the CT machine tunnel for the scan. The person running the machine will give you instructions while the scans are being done. You may be asked to: Keep your arms above your head. Hold your breath.  Stay very still, even if the table is moving. When the scanning is complete, you will be moved out of the machine. The IV will be removed. The procedure may vary among health care providers and hospitals. What can I expect after the procedure? After your procedure, it is common to have: A metallic taste in your mouth from the contrast dye. A feeling of warmth. A headache from the nitroglycerin. Follow these instructions at home: Take over-the-counter and prescription medicines only as told by your health care provider. If you are told, drink enough fluid to keep your urine pale yellow. This will help to flush the contrast dye out of your body. Most people can return to their normal activities right  after the procedure. Ask your health care provider what activities are safe for you. It is up to you to get the results of your procedure. Ask your health care provider, or the department that is doing the procedure, when your results will be ready. Keep all follow-up visits as told by your health care provider. This is important. Contact a health care provider if: You have any symptoms of allergy to the contrast dye. These include: Shortness of breath. Rash or hives. A racing heartbeat. Summary A cardiac CT angiogram is a procedure to look at the heart and the area around the heart. It may be done to help find the cause of chest pains or other symptoms of heart disease. During this procedure, a large X-ray machine, called a CT scanner, takes detailed pictures of the heart and the surrounding area after a contrast dye has been injected into blood vessels in the area. Ask your health care provider about changing or stopping your regular medicines before the procedure. This is especially important if you are taking diabetes medicines, blood thinners, or medicines to treat erectile dysfunction. If you are told, drink enough fluid to keep your urine pale yellow. This will help to flush the contrast dye out of your body. This information is not intended to replace advice given to you by your health care provider. Make sure you discuss any questions you have with your health care provider. Document Revised: 05/05/2019 Document Reviewed: 05/05/2019 Elsevier Patient Education  2020 [employer].

## 2023-08-28 NOTE — Progress Notes (Signed)
Cardiology Office Note:    Date:  08/28/2023   ID:  Carol Love, DOB January 01, 1951, MRN [id number]  PCP:  Melida Quitter, MD  Cardiologist:  Garwin Brothers, MD   Referring MD: Melida Quitter, MD    ASSESSMENT:    1. Coronary artery disease involving native coronary artery of native heart without angina pectoris   2. Mixed hyperlipidemia   3. Essential hypertension    PLAN:    In order of problems listed above:  Coronary artery disease: Secondary prevention stressed to the patient.  Importance of compliance with diet medication stressed and she vocalized understanding. Dyspnea on exertion: I would like to rule out any ischemic substrate.  Her coronaries need reevaluation.  I discussed various modalities of evaluation.  She prefers CT coronary angiography and we will schedule her for the same. Mixed dyslipidemia: On lipid-lowering medications.  Lipids reviewed triglycerides elevated.  Diet emphasized.  She will be back in 2 months for follow-up of liver lipid check.  She was advised to take fish oil 2 g twice daily. Essential hypertension: Blood pressure stable and diet was emphasized. Patient will be seen in follow-up appointment in 9 months or earlier if the patient has any concerns.    Medication Adjustments/Labs and Tests Ordered: Current medicines are reviewed at length with the patient today.  Concerns regarding medicines are outlined above.  No orders of the defined types were placed in this encounter.  No orders of the defined types were placed in this encounter.    No chief complaint on file.    History of Present Illness:    Carol Love is a 72 y.o. female.  Patient has past medical history of coronary artery disease, essential hypertension and dyslipidemia.  She mentions to me that she recently had gastric ulcer with hemorrhage.  Therefore she stopped aspirin and other pain medications.  Subsequently she has done fine.  She leads a sedentary lifestyle because  of orthopedic issues involving her back.  She has some dyspnea on exertion.  At the time of my evaluation, the patient is alert awake oriented and in no distress.  Past Medical History:  Diagnosis Date   ABLA (acute blood loss anemia) 05/16/2023   Abnormal nuclear stress test 12/08/2018   Alcohol use 05/16/2023   Anxiety    Arthritis    Arthropathy of lumbar facet joint 08/27/2023   CAD (coronary artery disease) 08/15/2021   Cervical spondylosis 02/07/2021   Chronic gastric ulcer with hemorrhage 05/16/2023   Chronic low back pain 07/15/2018   Constipation    uses stool softener with stimulant   Coronary artery disease involving native coronary artery of native heart without angina pectoris 03/01/2015   Overview:  2014 ST elevation MI  Formatting of this note might be different from the original. 2014 ST elevation MI   Degenerative spondylolisthesis 03/20/2015   Depression    Disorder of bone 10/27/2014   Disorder of sacroiliac joint 12/19/2015   Encounter for general adult medical examination without abnormal findings 08/15/2021   Essential (primary) hypertension 04/26/2010   Generalized anxiety disorder 01/06/2020   GI bleeding 05/16/2023   Hardening of the aorta (main artery of the heart) (HCC) 03/01/2015   Overview:  2014 ST elevation MI  Formatting of this note might be different from the original. 2014 ST elevation MI   Headache    last h/a  2001   Hyperglycemia 01/06/2020   Hyperlipidemia    Irregular heart beat  hx - no problems since taking acebutolol   Lumbar radiculopathy 08/27/2023   Melena 05/16/2023   Overweight 01/06/2020   Psoriasis    Recurrent major depression (HCC) 09/26/2021   Spinal stenosis of lumbar region 05/10/2010   Spondylolisthesis 10/27/2014    Past Surgical History:  Procedure Laterality Date   APPENDECTOMY  05   BIOPSY  05/16/2023   Procedure: BIOPSY;  Surgeon: Sherrilyn Rist, MD;  Location: [facility] ENDOSCOPY;  Service: Gastroenterology;;    CARDIAC CATHETERIZATION  2014   stent   CARDIOVASCULAR STRESS TEST  03/07/15   COLONOSCOPY     CORONARY ANGIOPLASTY     DES RCA 12/13/12   CORONARY STENT PLACEMENT     DILATION AND CURETTAGE OF UTERUS     ESOPHAGOGASTRODUODENOSCOPY (EGD) WITH PROPOFOL N/A 05/16/2023   Procedure: ESOPHAGOGASTRODUODENOSCOPY (EGD) WITH PROPOFOL;  Surgeon: Sherrilyn Rist, MD;  Location: [facility] ENDOSCOPY;  Service: Gastroenterology;  Laterality: N/A;   HEMOSTASIS CONTROL  05/16/2023   Procedure: HEMOSTASIS CONTROL;  Surgeon: Sherrilyn Rist, MD;  Location: [facility] ENDOSCOPY;  Service: Gastroenterology;;   LEFT HEART CATH AND CORONARY ANGIOGRAPHY N/A 12/10/2018   Procedure: LEFT HEART CATH AND CORONARY ANGIOGRAPHY;  Surgeon: Lyn Records, MD;  Location: [facility] INVASIVE CV LAB;  Service: Cardiovascular;  Laterality: N/A;   MAXIMUM ACCESS (MAS)POSTERIOR LUMBAR INTERBODY FUSION (PLIF) 1 LEVEL N/A 03/20/2015   Procedure: FOR MAXIMUM ACCESS (MAS) POSTERIOR LUMBAR INTERBODY FUSION (PLIF) 1 LEVEL;  Surgeon: Julio Sicks, MD;  Location: [facility] NEURO ORS;  Service: Neurosurgery;  Laterality: N/A;  FOR MAXIMUM ACCESS (MAS) POSTERIOR LUMBAR INTERBODY FUSION (PLIF) 1 LEVEL LUMBAR FOUR-FIVE   NASAL SEPTUM SURGERY  1985   s/p fall   SHOULDER ARTHROSCOPY Left 08   SUBMUCOSAL INJECTION  05/16/2023   Procedure: SUBMUCOSAL INJECTION;  Surgeon: Sherrilyn Rist, MD;  Location: [facility] ENDOSCOPY;  Service: Gastroenterology;;   TONSILLECTOMY     TOOTH EXTRACTION     4 teeth pulled prior to braces as a child   WISDOM TOOTH EXTRACTION      Current Medications: Current Meds  Medication Sig   ACCRUFER 30 MG CAPS Take 1 capsule by mouth 2 (two) times daily.   acebutolol (SECTRAL) 200 MG capsule Take 1 capsule (200 mg total) by mouth daily.   ALPRAZolam (XANAX) 0.5 MG tablet Take 0.5 mg by mouth 2 (two) times daily.   amitriptyline (ELAVIL) 50 MG tablet Take 50 mg by mouth at bedtime.   amLODipine (NORVASC) 5 MG tablet Take 1 tablet (5 mg total) by  mouth daily.   ARIPiprazole (ABILIFY) 5 MG tablet Take 5 mg by mouth at bedtime.   atorvastatin (LIPITOR) 40 MG tablet Take 1 tablet (40 mg total) by mouth every evening. Final attempt, patient needs and appt for additional refills   busPIRone (BUSPAR) 15 MG tablet Take 15 mg by mouth 2 (two) times daily.   Calcium Citrate-Vitamin D (CALCIUM + D PO) Take 1 tablet by mouth daily.   DULoxetine (CYMBALTA) 30 MG capsule Take 30 mg by mouth daily. Take with   DULoxetine (CYMBALTA) 60 MG capsule Take 60 mg by mouth daily.   gabapentin (NEURONTIN) 300 MG capsule Take 300 mg by mouth 3 (three) times daily.   pantoprazole (PROTONIX) 40 MG tablet Take 1 tablet (40 mg total) by mouth 2 (two) times daily.   propranolol (INDERAL) 10 MG tablet Take 10 mg by mouth as needed (anxiety).   traMADol (ULTRAM) 50 MG tablet Take 50 mg by  mouth as needed for moderate pain (pain score 4-6) or severe pain (pain score 7-10).     Allergies:   Epinephrine, Ibuprofen, Penicillins, and Sulfa antibiotics   Social History   Socioeconomic History   Marital status: Married    Spouse name: Not on file   Number of children: 3   Years of education: Not on file   Highest education level: Not on file  Occupational History   Not on file  Tobacco Use   Smoking status: Former    Current packs/day: 0.00    Average packs/day: 0.5 packs/day for 45.0 years (22.5 ttl pk-yrs)    Types: Cigarettes    Start date: 10/10/1967    Quit date: 10/09/2012    Years since quitting: 10.8   Smokeless tobacco: Never  Vaping Use   Vaping status: Never Used  Substance and Sexual Activity   Alcohol use: Not Currently   Drug use: No   Sexual activity: Not on file  Other Topics Concern   Not on file  Social History Narrative   Not on file   Social Determinants of Health   Financial Resource Strain: Not on file  Food Insecurity: No Food Insecurity (05/16/2023)   Hunger Vital Sign    Worried About Running Out of Food in the Last Year:  Never true    Ran Out of Food in the Last Year: Never true  Transportation Needs: No Transportation Needs (05/16/2023)   PRAPARE - Administrator, Civil Service (Medical): No    Lack of Transportation (Non-Medical): No  Physical Activity: Not on file  Stress: Not on file  Social Connections: Not on file     Family History: The patient's family history includes AAA (abdominal aortic aneurysm) in her mother; Heart disease in her father and mother; Hyperlipidemia in her mother; Lung cancer in her mother. There is no history of Colon cancer, Colon polyps, Esophageal cancer, Rectal cancer, or Stomach cancer.  ROS:   Please see the history of present illness.    All other systems reviewed and are negative.  EKGs/Labs/Other Studies Reviewed:    The following studies were reviewed today: I discussed my findings with the patient at length   Recent Labs: 06/09/2023: ALT 29; BUN 9; Creatinine, Ser 0.88; Hemoglobin 10.6; Platelets 260.0; Potassium 4.3; Sodium 140  Recent Lipid Panel    Component Value Date/Time   CHOL 163 11/30/2018 1231   TRIG 250 (H) 11/30/2018 1231   HDL 49 11/30/2018 1231   CHOLHDL 3.3 11/30/2018 1231   LDLCALC 64 11/30/2018 1231    Physical Exam:    VS:  BP 114/74   Pulse 70   Ht 4' 11.6" (1.514 m)   Wt 136 lb 3.2 oz (61.8 kg)   LMP  (LMP Unknown)   SpO2 94%   BMI 26.96 kg/m     Wt Readings from Last 3 Encounters:  08/28/23 136 lb 3.2 oz (61.8 kg)  06/30/23 125 lb (56.7 kg)  06/09/23 125 lb 12.8 oz (57.1 kg)     GEN: Patient is in no acute distress HEENT: Normal NECK: No JVD; No carotid bruits LYMPHATICS: No lymphadenopathy CARDIAC: Hear sounds regular, 2/6 systolic murmur at the apex. RESPIRATORY:  Clear to auscultation without rales, wheezing or rhonchi  ABDOMEN: Soft, non-tender, non-distended MUSCULOSKELETAL:  No edema; No deformity  SKIN: Warm and dry NEUROLOGIC:  Alert and oriented x 3 PSYCHIATRIC:  Normal affect    Signed, Garwin Brothers, MD  08/28/2023 2:49 PM  [facility]

## 2023-08-29 LAB — BASIC METABOLIC PANEL
BUN/Creatinine Ratio: 20 (ref 12–28)
BUN: 17 mg/dL (ref 8–27)
CO2: 25 mmol/L (ref 20–29)
Calcium: 9.1 mg/dL (ref 8.7–10.3)
Chloride: 99 mmol/L (ref 96–106)
Creatinine, Ser: 0.84 mg/dL (ref 0.57–1.00)
Glucose: 94 mg/dL (ref 70–99)
Potassium: 4.3 mmol/L (ref 3.5–5.2)
Sodium: 139 mmol/L (ref 134–144)
eGFR: 74 mL/min/{1.73_m2} (ref >59)

## 2023-09-06 ENCOUNTER — Other Ambulatory Visit: Payer: Self-pay | Admitting: Cardiology

## 2023-09-09 DIAGNOSIS — F419 Anxiety disorder, unspecified: Secondary | ICD-10-CM | POA: Diagnosis not present

## 2023-09-09 DIAGNOSIS — F330 Major depressive disorder, recurrent, mild: Secondary | ICD-10-CM | POA: Diagnosis not present

## 2023-09-18 DIAGNOSIS — M461 Sacroiliitis, not elsewhere classified: Secondary | ICD-10-CM | POA: Diagnosis not present

## 2023-09-19 ENCOUNTER — Encounter (HOSPITAL_COMMUNITY): Payer: Self-pay

## 2023-09-19 ENCOUNTER — Telehealth (HOSPITAL_COMMUNITY): Payer: Self-pay | Admitting: Emergency Medicine

## 2023-09-19 NOTE — Telephone Encounter (Signed)
Reaching out to patient to offer assistance regarding upcoming cardiac imaging study; pt verbalizes understanding of appt date/time, parking situation and where to check in, pre-test NPO status and medications ordered, and verified current allergies; name and call back number provided for further questions should they arise Cayne Yom RN Navigator Cardiac Imaging [facility] [phone] office [phone] cell 

## 2023-09-23 ENCOUNTER — Ambulatory Visit (HOSPITAL_BASED_OUTPATIENT_CLINIC_OR_DEPARTMENT_OTHER): Admission: RE | Admit: 2023-09-23 | Payer: Medicare Other | Source: Ambulatory Visit

## 2023-09-25 ENCOUNTER — Telehealth (HOSPITAL_COMMUNITY): Payer: Self-pay | Admitting: Emergency Medicine

## 2023-09-25 DIAGNOSIS — I2510 Atherosclerotic heart disease of native coronary artery without angina pectoris: Secondary | ICD-10-CM

## 2023-09-25 MED ORDER — METOPROLOL TARTRATE 100 MG PO TABS: 100.0000 mg | ORAL_TABLET | Freq: Once | ORAL | 0 refills | Status: AC

## 2023-09-25 NOTE — Telephone Encounter (Signed)
 Reaching out to patient to offer assistance regarding upcoming cardiac imaging study; pt verbalizes understanding of appt date/time, parking situation and where to check in, pre-test NPO status and medications ordered, and verified current allergies; name and call back number provided for further questions should they arise Camie Shutter RN Navigator Cardiac Imaging [facility] [phone] office [phone] cell  New appt made 10/06/23 1:30p (1:00p arrival)   New rx 100mg  metoprolol  sent to pharm on file

## 2023-10-03 ENCOUNTER — Telehealth (HOSPITAL_COMMUNITY): Payer: Self-pay | Admitting: Emergency Medicine

## 2023-10-03 NOTE — Telephone Encounter (Signed)
 Reaching out to patient to offer assistance regarding upcoming cardiac imaging study; pt verbalizes understanding of appt date/time, parking situation and where to check in, pre-test NPO status and medications ordered, and verified current allergies; name and call back number provided for further questions should they arise Rockwell Alexandria RN Navigator Cardiac Imaging [facility] [phone] office [phone] cell

## 2023-10-06 ENCOUNTER — Ambulatory Visit (HOSPITAL_COMMUNITY): Admission: RE | Admit: 2023-10-06 | Payer: Medicare Other | Source: Ambulatory Visit

## 2023-10-08 DIAGNOSIS — M47816 Spondylosis without myelopathy or radiculopathy, lumbar region: Secondary | ICD-10-CM | POA: Diagnosis not present

## 2023-10-08 DIAGNOSIS — Z6827 Body mass index (BMI) 27.0-27.9, adult: Secondary | ICD-10-CM | POA: Diagnosis not present

## 2023-10-13 DIAGNOSIS — Z1231 Encounter for screening mammogram for malignant neoplasm of breast: Secondary | ICD-10-CM | POA: Diagnosis not present

## 2023-10-15 ENCOUNTER — Telehealth (HOSPITAL_COMMUNITY): Payer: Self-pay | Admitting: *Deleted

## 2023-10-15 NOTE — Telephone Encounter (Signed)
Reaching out to patient to offer assistance regarding upcoming cardiac imaging study; pt verbalizes understanding of appt date/time, parking situation and where to check in, pre-test NPO status and medications ordered, and verified current allergies; name and call back number provided for further questions should they arise  Larey Brick RN Navigator Cardiac Imaging [facility] [phone] office [phone] cell  Patient to take 100mg  metoprolol tartrate two hours prior to her cardiac CT scan. She is aware to arrive at 1 PM.

## 2023-10-16 ENCOUNTER — Ambulatory Visit (HOSPITAL_COMMUNITY)
Admission: RE | Admit: 2023-10-16 | Discharge: 2023-10-16 | Disposition: A | Discharge status: Home / Self Care | Payer: Medicare Other | Source: Ambulatory Visit | Attending: Cardiology | Admitting: Cardiology

## 2023-10-16 DIAGNOSIS — I2510 Atherosclerotic heart disease of native coronary artery without angina pectoris: Secondary | ICD-10-CM | POA: Diagnosis not present

## 2023-10-16 MED ORDER — NITROGLYCERIN 0.4 MG SL SUBL
SUBLINGUAL_TABLET | SUBLINGUAL | Status: AC
  Filled 2023-10-16: qty 2

## 2023-10-16 MED ORDER — IOHEXOL 350 MG/ML SOLN: 95.0000 mL | Freq: Once | INTRAVENOUS | Status: DC | PRN

## 2023-10-16 MED ORDER — NITROGLYCERIN 0.4 MG SL SUBL
0.8000 mg | SUBLINGUAL_TABLET | Freq: Once | SUBLINGUAL | Status: AC
Start: 2023-10-16 — End: 2023-10-16
  Administered 2023-10-16: 0.8 mg via SUBLINGUAL

## 2023-10-16 MED ORDER — IOHEXOL 350 MG/ML SOLN
95.0000 mL | Freq: Once | INTRAVENOUS | Status: AC | PRN
  Administered 2023-10-16: 95 mL via INTRAVENOUS

## 2023-10-16 NOTE — Progress Notes (Signed)
 Patient tolerated CT well. VSS. Encouraged to drink water throughout day. Reasons explained and verbalized understanding. Ambulated, steady gate.

## 2023-11-07 DIAGNOSIS — E785 Hyperlipidemia, unspecified: Secondary | ICD-10-CM | POA: Diagnosis not present

## 2023-11-10 DIAGNOSIS — M8580 Other specified disorders of bone density and structure, unspecified site: Secondary | ICD-10-CM | POA: Diagnosis not present

## 2023-11-10 DIAGNOSIS — R739 Hyperglycemia, unspecified: Secondary | ICD-10-CM | POA: Diagnosis not present

## 2023-11-10 DIAGNOSIS — E785 Hyperlipidemia, unspecified: Secondary | ICD-10-CM | POA: Diagnosis not present

## 2023-11-14 DIAGNOSIS — I10 Essential (primary) hypertension: Secondary | ICD-10-CM | POA: Diagnosis not present

## 2023-11-14 DIAGNOSIS — Z1339 Encounter for screening examination for other mental health and behavioral disorders: Secondary | ICD-10-CM | POA: Diagnosis not present

## 2023-11-14 DIAGNOSIS — Z1331 Encounter for screening for depression: Secondary | ICD-10-CM | POA: Diagnosis not present

## 2023-11-14 DIAGNOSIS — J439 Emphysema, unspecified: Secondary | ICD-10-CM | POA: Diagnosis not present

## 2023-11-14 DIAGNOSIS — Z0000 Encounter for general adult medical examination without abnormal findings: Secondary | ICD-10-CM | POA: Diagnosis not present

## 2023-11-17 ENCOUNTER — Telehealth: Payer: Self-pay | Admitting: Gastroenterology

## 2023-11-17 NOTE — Telephone Encounter (Signed)
 PT is calling to get confirmation if patient can decrease pantoprazole to 200mg . Please advise.

## 2023-11-17 NOTE — Telephone Encounter (Signed)
**Note De-identified Hilliary Jock Obfuscation** Please advise 

## 2023-11-18 NOTE — Telephone Encounter (Signed)
 Please check if patient is not having nay heartburn or GI symptoms, if she is doing fine she can decrease to Pantoprazole 20mg  daily. Please send Rx for 90 days with 3 refills. Thanks

## 2023-11-19 NOTE — Telephone Encounter (Signed)
 Attempted to contact patient and left a vm to return call.

## 2023-11-26 MED ORDER — PANTOPRAZOLE SODIUM 20 MG PO TBEC: 20.0000 mg | DELAYED_RELEASE_TABLET | Freq: Every day | ORAL | 3 refills | Status: AC

## 2023-11-26 NOTE — Telephone Encounter (Signed)
 Contacted patient and patient stated that she isn doing fine. Pantoprazole was sent to pharmacy.

## 2023-11-27 DIAGNOSIS — M47816 Spondylosis without myelopathy or radiculopathy, lumbar region: Secondary | ICD-10-CM | POA: Diagnosis not present

## 2023-12-10 DIAGNOSIS — M47816 Spondylosis without myelopathy or radiculopathy, lumbar region: Secondary | ICD-10-CM | POA: Diagnosis not present

## 2023-12-12 ENCOUNTER — Other Ambulatory Visit: Payer: Self-pay | Admitting: Internal Medicine

## 2023-12-12 DIAGNOSIS — Z87891 Personal history of nicotine dependence: Secondary | ICD-10-CM

## 2023-12-17 DIAGNOSIS — K080 Exfoliation of teeth due to systemic causes: Secondary | ICD-10-CM | POA: Diagnosis not present

## 2024-01-13 ENCOUNTER — Encounter (HOSPITAL_COMMUNITY)

## 2024-03-22 ENCOUNTER — Telehealth: Payer: Self-pay | Admitting: *Deleted

## 2024-03-22 DIAGNOSIS — F32A Depression, unspecified: Secondary | ICD-10-CM

## 2024-03-22 DIAGNOSIS — F411 Generalized anxiety disorder: Secondary | ICD-10-CM

## 2024-03-22 NOTE — Progress Notes (Signed)
 Complex Care Management Note  Care Guide Note 03/22/2024 Name: Carol Love MRN: [id number] DOB: 06-Nov-1950  Carol Love is a 73 y.o. year old female who sees Wile, Leita DEL, MD for primary care. I reached out to Rock LITTIE Rummer by phone today to offer complex care management services.  Carol Love was given information about Complex Care Management services today including:   The Complex Care Management services include support from the care team which includes your Nurse Care Manager, Clinical Social Worker, or Pharmacist.  The Complex Care Management team is here to help remove barriers to the health concerns and goals most important to you. Complex Care Management services are voluntary, and the patient may decline or stop services at any time by request to their care team member.   Complex Care Management Consent Status: Patient agreed to services and verbal consent obtained.   Follow up plan:  Telephone appointment with complex care management team member scheduled for:  04/05/24  Encounter Outcome:  Patient Scheduled  Harlene Satterfield  [facility]  [facility], [facility] Guide  Direct Dial: [phone]  Fax [phone]

## 2024-04-05 ENCOUNTER — Other Ambulatory Visit: Payer: Self-pay | Admitting: *Deleted

## 2024-04-05 NOTE — Patient Outreach (Unsigned)
 Complex Care Management   Visit Note  04/05/2024  Name:  Carol Love MRN: [id number] DOB: 1950-10-31  Situation: Referral received for Complex Care Management related to Mental/Behavioral Health diagnosis anxiety and caregiver strain I obtained verbal consent from Patient.  Visit completed with patient  on the phone  Background:   Past Medical History:  Diagnosis Date   ABLA (acute blood loss anemia) 05/16/2023   Abnormal nuclear stress test 12/08/2018   Alcohol  use 05/16/2023   Anxiety    Arthritis    Arthropathy of lumbar facet joint 08/27/2023   CAD (coronary artery disease) 08/15/2021   Cervical spondylosis 02/07/2021   Chronic gastric ulcer with hemorrhage 05/16/2023   Chronic low back pain 07/15/2018   Constipation    uses stool softener with stimulant   Coronary artery disease involving native coronary artery of native heart without angina pectoris 03/01/2015   Overview:  2014 ST elevation MI  Formatting of this note might be different from the original. 2014 ST elevation MI   Degenerative spondylolisthesis 03/20/2015   Depression    Disorder of bone 10/27/2014   Disorder of sacroiliac joint 12/19/2015   Encounter for general adult medical examination without abnormal findings 08/15/2021   Essential (primary) hypertension 04/26/2010   Generalized anxiety disorder 01/06/2020   GI bleeding 05/16/2023   Hardening of the aorta (main artery of the heart) (HCC) 03/01/2015   Overview:  2014 ST elevation MI  Formatting of this note might be different from the original. 2014 ST elevation MI   Headache    last h/a  2001   Hyperglycemia 01/06/2020   Hyperlipidemia    Irregular heart beat    hx - no problems since taking acebutolol    Lumbar radiculopathy 08/27/2023   Melena 05/16/2023   Overweight 01/06/2020   Psoriasis    Recurrent major depression (HCC) 09/26/2021   Spinal stenosis of lumbar region 05/10/2010   Spondylolisthesis 10/27/2014    Assessment: Patient  Reported Symptoms:  Cognitive Cognitive Status: No symptoms reported      Neurological Neurological Review of Symptoms: No symptoms reported    HEENT HEENT Symptoms Reported: No symptoms reported      Cardiovascular Cardiovascular Symptoms Reported: No symptoms reported    Respiratory Respiratory Symptoms Reported: No symptoms reported    Endocrine      Gastrointestinal Gastrointestinal Symptoms Reported: No symptoms reported      Genitourinary Genitourinary Symptoms Reported: No symptoms reported    Integumentary Integumentary Symptoms Reported: No symptoms reported    Musculoskeletal Musculoskelatal Symptoms Reviewed: Back pain Additional Musculoskeletal Details: chronic back pain-4 discs fused-bending is affected by it Musculoskeletal Management Strategies: Adequate rest, Medication therapy Falls in the past year?: No    Psychosocial Psychosocial Symptoms Reported: Anxiety - if selected complete GAD Additional Psychological Details: spouse has dementia, contributes to her anxiety not sure when aggressive behaviors or uncooperativeness will present itself Behavioral Management Strategies: Adequate rest, Coping strategies, Medication therapy Major Change/Loss/Stressor/Fears (CP): Medical condition, self Behaviors When Feeling Stressed/Fearful: deep breathing excercises Techniques to Cope with Loss/Stress/Change: Diversional activities, Counseling Quality of Family Relationships: supportive Do you feel physically threatened by others?: Yes      04/05/2024   11:33 AM  Depression screen PHQ 2/9  Decreased Interest 0  Down, Depressed, Hopeless 2  PHQ - 2 Score 2  Altered sleeping 0  Tired, decreased energy 1  Change in appetite 0  Feeling bad or failure about yourself  0  Trouble concentrating 0  Moving slowly or fidgety/restless  0  Suicidal thoughts 0  PHQ-9 Score 3    There were no vitals filed for this visit.  Medications Reviewed Today     Reviewed by Ermalinda Lenn HERO, LCSW (Social Worker) on 04/05/24 at 1131  Med List Status: <None>   Medication Order Taking? Sig Documenting Provider Last Dose Status Informant  ACCRUFER 30 MG CAPS [id number]  Take 1 capsule by mouth 2 (two) times daily.  Patient not taking: Reported on 04/05/2024   [provider]  Active   acebutolol  (SECTRAL ) 200 MG capsule [id number] Yes Take 1 capsule (200 mg total) by mouth daily. Revankar, Jennifer SAUNDERS, MD  Active Self, Pharmacy Records  ALPRAZolam  (XANAX ) 0.5 MG tablet [id number] Yes Take 0.5 mg by mouth 2 (two) times daily. [provider]  Active Self, Pharmacy Records           Med Note CHARMAYNE, [facility] N   Tue May 27, 2017 11:07 AM)    amitriptyline  (ELAVIL ) 50 MG tablet [id number] Yes Take 50 mg by mouth at bedtime. [provider]  Active Self, Pharmacy Records           Med Note CHARMAYNE, [facility]   Tue May 27, 2017 11:07 AM)    amLODipine  (NORVASC ) 5 MG tablet [id number] Yes Take 1 tablet (5 mg total) by mouth daily. Elgergawy, Brayton RAMAN, MD  Active   ARIPiprazole  (ABILIFY ) 5 MG tablet [id number] Yes Take 5 mg by mouth at bedtime. [provider]  Active Self, Pharmacy Records  atorvastatin  (LIPITOR) 40 MG tablet [id number] Yes TAKE 1 TABLET BY MOUTH EVERY EVENING Revankar, Jennifer SAUNDERS, MD  Active   busPIRone  (BUSPAR ) 15 MG tablet [id number] Yes Take 15 mg by mouth 2 (two) times daily. [provider]  Active Self, Pharmacy Records  Calcium  Citrate-Vitamin D  (CALCIUM  + D PO) [id number] Yes Take 1 tablet by mouth daily. [provider]  Active Self, Pharmacy Records  DULoxetine  (CYMBALTA ) 30 MG capsule [id number] Yes Take 30 mg by mouth daily. Take with [provider]  Active Self, Pharmacy Records           Med Note (GARNER, TIFFANY L   Thu Aug 28, 2023  2:17 PM)    DULoxetine  (CYMBALTA ) 60 MG capsule [id number]  Take 60 mg by mouth daily.  Patient not taking: Reported on 04/05/2024   [provider]  Active Self, Pharmacy  Records  gabapentin  (NEURONTIN ) 300 MG capsule [id number] Yes Take 300 mg by mouth 3 (three) times daily. [provider]  Active   metoprolol  tartrate (LOPRESSOR ) 100 MG tablet [id number]  Take 1 tablet (100 mg total) by mouth once for 1 dose. Take 2 hours prior to your CT if your heart rate is greater than 55 Revankar, Jennifer SAUNDERS, MD  Expired 09/25/23 2359   pantoprazole  (PROTONIX ) 20 MG tablet [id number]  Take 1 tablet (20 mg total) by mouth daily.  Patient not taking: Reported on 04/05/2024   Nandigam, Kavitha V, MD  Active   pantoprazole  (PROTONIX ) 40 MG tablet [id number] Yes Take 1 tablet (40 mg total) by mouth 2 (two) times daily. Elgergawy, Brayton RAMAN, MD  Active   propranolol (INDERAL) 10 MG tablet [id number] Yes Take 10 mg by mouth as needed (anxiety). [provider]  Active   traMADol  (ULTRAM ) 50 MG tablet [id number] Yes Take 50 mg by mouth as needed for moderate pain (pain score 4-6) or severe pain (pain score 7-10). [provider]  Active  Recommendation:   PCP Follow-up Mental Health follow up as scheduled  Follow Up Plan:   Telephone follow-up ***  Kirandeep Fariss, LCSW [place]  [place], [facility] Licensed Clinical Social Worker  Direct Dial: [phone]

## 2024-04-06 ENCOUNTER — Other Ambulatory Visit: Payer: Self-pay | Admitting: *Deleted

## 2024-04-06 NOTE — Patient Instructions (Signed)
 Visit Information  Thank you for taking time to visit with me today. Please don't hesitate to contact me if I can be of assistance to you before our next scheduled appointment.  Our next appointment is by telephone on 04/06/24 at 12pm Please call the care guide team at [phone] if you need to cancel or reschedule your appointment.   Following is a copy of your care plan:   Goals Addressed             This Visit's Progress    VBCI Social Work Care Plan       Problems:   Disease Management support and education needs related to caregiver stress  CSW Clinical Goal(s):   Over the next 90 days the Patient will demonstrate a reduction in symptoms related to Caregiver Stress    Interventions:  Mental Health:  Evaluation of current treatment plan related to increased anxiety related to care giving responsibilities Active listening / Reflection utilized Caregiver stress acknowledged :self care emphasized Discussed caregiver resources and support: discussed options for Adult Day treatment and caregiver support programs Discussed referral options to connect for ongoing therapy: confirmed referral to be place to [facility] for ongoing counseling and medication management Emotional Support Provided PHQ2/PHQ9 completed Solution-Focued Strategies employed: GAD 7 completed  Patient Goals/Self-Care Activities:  Continue taking your medication as prescribed.   Referral to be placed for [facility] for ongoing mental health support  Plan:   Telephone follow up appointment with care management team member scheduled for:  04/06/24        Please call 911 if you are experiencing a Mental Health or Behavioral Health Crisis or need someone to talk to.  Patient verbalizes understanding of instructions and care plan provided today and agrees to view in MyChart. Active MyChart status and patient understanding of how to access instructions and care plan via  MyChart confirmed with patient.     Kashira Behunin, LCSW [place]  [facility], [facility] Licensed Clinical Social Worker  Direct Dial: [phone]

## 2024-04-06 NOTE — Patient Outreach (Addendum)
 Complex Care Management   Visit Note  04/06/2024  Name:  Carol Love MRN: [id number] DOB: 09-11-1951  Situation: Referral received for Complex Care Management related to Anxiety and caregiver stress I obtained verbal consent from Caregiver.  Visit completed with caregiver. on the phone on 04/05/24  Background:   Past Medical History:  Diagnosis Date   ABLA (acute blood loss anemia) 05/16/2023   Abnormal nuclear stress test 12/08/2018   Alcohol  use 05/16/2023   Anxiety    Arthritis    Arthropathy of lumbar facet joint 08/27/2023   CAD (coronary artery disease) 08/15/2021   Cervical spondylosis 02/07/2021   Chronic gastric ulcer with hemorrhage 05/16/2023   Chronic low back pain 07/15/2018   Constipation    uses stool softener with stimulant   Coronary artery disease involving native coronary artery of native heart without angina pectoris 03/01/2015   Overview:  2014 ST elevation MI  Formatting of this note might be different from the original. 2014 ST elevation MI   Degenerative spondylolisthesis 03/20/2015   Depression    Disorder of bone 10/27/2014   Disorder of sacroiliac joint 12/19/2015   Encounter for general adult medical examination without abnormal findings 08/15/2021   Essential (primary) hypertension 04/26/2010   Generalized anxiety disorder 01/06/2020   GI bleeding 05/16/2023   Hardening of the aorta (main artery of the heart) (HCC) 03/01/2015   Overview:  2014 ST elevation MI  Formatting of this note might be different from the original. 2014 ST elevation MI   Headache    last h/a  2001   Hyperglycemia 01/06/2020   Hyperlipidemia    Irregular heart beat    hx - no problems since taking acebutolol    Lumbar radiculopathy 08/27/2023   Melena 05/16/2023   Overweight 01/06/2020   Psoriasis    Recurrent major depression (HCC) 09/26/2021   Spinal stenosis of lumbar region 05/10/2010   Spondylolisthesis 10/27/2014    Assessment: Patient Reported  Symptoms:  Cognitive Cognitive Status: No symptoms reported      Neurological Neurological Review of Symptoms: No symptoms reported    HEENT HEENT Symptoms Reported: No symptoms reported      Cardiovascular Cardiovascular Symptoms Reported: No symptoms reported    Respiratory Respiratory Symptoms Reported: No symptoms reported    Endocrine Endocrine Symptoms Reported: No symptoms reported    Gastrointestinal Gastrointestinal Symptoms Reported: No symptoms reported      Genitourinary Genitourinary Symptoms Reported: No symptoms reported    Integumentary Integumentary Symptoms Reported: No symptoms reported    Musculoskeletal Musculoskelatal Symptoms Reviewed: Back pain Additional Musculoskeletal Details: chronic back pain-4 discs fused-bending is affected by it Musculoskeletal Management Strategies: Adequate rest, Medication therapy Falls in the past year?: No    Psychosocial Psychosocial Symptoms Reported: Anxiety - if selected complete GAD Additional Psychological Details: spouse has dementia, contributes to her anxiety not sure when aggressive behaviors or uncooperativeness will present itself Behavioral Management Strategies: Adequate rest, Coping strategies, Medication therapy Major Change/Loss/Stressor/Fears (CP): Medical condition, self Behaviors When Feeling Stressed/Fearful: deep breathing excercises Techniques to Cope with Loss/Stress/Change: Diversional activities, Counseling Quality of Family Relationships: supportive Do you feel physically threatened by others?: Yes      04/05/2024   11:33 AM  Depression screen PHQ 2/9  Decreased Interest 0  Down, Depressed, Hopeless 2  PHQ - 2 Score 2  Altered sleeping 0  Tired, decreased energy 1  Change in appetite 0  Feeling bad or failure about yourself  0  Trouble concentrating 0  Moving slowly  or fidgety/restless 0  Suicidal thoughts 0  PHQ-9 Score 3      04/05/2024   11:51 AM  GAD 7 : Generalized Anxiety Score   Nervous, Anxious, on Edge 3  Control/stop worrying 2  Worry too much - different things 2  Trouble relaxing 2  Restless 0  Easily annoyed or irritable 0  Afraid - awful might happen 2  Total GAD 7 Score 11  Anxiety Difficulty Not difficult at all      There were no vitals filed for this visit.  Medications Reviewed Today     Reviewed by Ermalinda Lenn HERO, LCSW (Social Worker) on 04/05/24 at 1131  Med List Status: <None>   Medication Order Taking? Sig Documenting Provider Last Dose Status Informant  ACCRUFER 30 MG CAPS [id number]  Take 1 capsule by mouth 2 (two) times daily.  Patient not taking: Reported on 04/05/2024   [provider]  Active   acebutolol  (SECTRAL ) 200 MG capsule [id number] Yes Take 1 capsule (200 mg total) by mouth daily. Revankar, Jennifer SAUNDERS, MD  Active Self, Pharmacy Records  ALPRAZolam  (XANAX ) 0.5 MG tablet [id number] Yes Take 0.5 mg by mouth 2 (two) times daily. [provider]  Active Self, Pharmacy Records           Med Note CHARMAYNE, [facility]   Tue May 27, 2017 11:07 AM)    amitriptyline  (ELAVIL ) 50 MG tablet [id number] Yes Take 50 mg by mouth at bedtime. [provider]  Active Self, Pharmacy Records           Med Note CHARMAYNE, [facility]   Tue May 27, 2017 11:07 AM)    amLODipine  (NORVASC ) 5 MG tablet [id number] Yes Take 1 tablet (5 mg total) by mouth daily. Elgergawy, Brayton RAMAN, MD  Active   ARIPiprazole  (ABILIFY ) 5 MG tablet [id number] Yes Take 5 mg by mouth at bedtime. [provider]  Active Self, Pharmacy Records  atorvastatin  (LIPITOR) 40 MG tablet [id number] Yes TAKE 1 TABLET BY MOUTH EVERY EVENING Revankar, Jennifer SAUNDERS, MD  Active   busPIRone  (BUSPAR ) 15 MG tablet [id number] Yes Take 15 mg by mouth 2 (two) times daily. [provider]  Active Self, Pharmacy Records  Calcium  Citrate-Vitamin D  (CALCIUM  + D PO) [id number] Yes Take 1 tablet by mouth daily. [provider]  Active Self, Pharmacy Records  DULoxetine  (CYMBALTA )  30 MG capsule [id number] Yes Take 30 mg by mouth daily. Take with [provider]  Active Self, Pharmacy Records           Med Note (GARNER, TIFFANY L   Thu Aug 28, 2023  2:17 PM)    DULoxetine  (CYMBALTA ) 60 MG capsule [id number]  Take 60 mg by mouth daily.  Patient not taking: Reported on 04/05/2024   [provider]  Active Self, Pharmacy Records  gabapentin  (NEURONTIN ) 300 MG capsule [id number] Yes Take 300 mg by mouth 3 (three) times daily. [provider]  Active   metoprolol  tartrate (LOPRESSOR ) 100 MG tablet [id number]  Take 1 tablet (100 mg total) by mouth once for 1 dose. Take 2 hours prior to your CT if your heart rate is greater than 55 Revankar, Jennifer SAUNDERS, MD  Expired 09/25/23 2359   pantoprazole  (PROTONIX ) 20 MG tablet [id number]  Take 1 tablet (20 mg total) by mouth daily.  Patient not taking: Reported on 04/05/2024   Nandigam, Kavitha V, MD  Active   pantoprazole  (PROTONIX ) 40 MG tablet [id number] Yes Take 1 tablet (40 mg total)  by mouth 2 (two) times daily. Elgergawy, Brayton RAMAN, MD  Active   propranolol (INDERAL) 10 MG tablet [id number] Yes Take 10 mg by mouth as needed (anxiety). [provider]  Active   traMADol  (ULTRAM ) 50 MG tablet [id number] Yes Take 50 mg by mouth as needed for moderate pain (pain score 4-6) or severe pain (pain score 7-10). [provider]  Active             Recommendation:   PCP Follow-up Follow up with [facility] for ongoing mental health counseling   Follow Up Plan:   Telephone follow-up 04/07/24  Lenn Mean, LCSW [place]  [facility], [facility] Licensed Clinical Social Worker  Direct Dial: [phone]

## 2024-04-07 NOTE — Patient Outreach (Signed)
 Complex Care Management   Visit Note  04/07/2024  Name:  Carol Love MRN: [id number] DOB: [birth year]  Situation: Referral received for Complex Care Management related to Mental/Behavioral Health diagnosis Anxiety and caregiver stress. I obtained verbal consent from Patient.  Visit completed with patient  on the phone on 04/07/24  Background:   Past Medical History:  Diagnosis Date   ABLA (acute blood loss anemia) 05/16/2023   Abnormal nuclear stress test 12/08/2018   Alcohol  use 05/16/2023   Anxiety    Arthritis    Arthropathy of lumbar facet joint 08/27/2023   CAD (coronary artery disease) 08/15/2021   Cervical spondylosis 02/07/2021   Chronic gastric ulcer with hemorrhage 05/16/2023   Chronic low back pain 07/15/2018   Constipation    uses stool softener with stimulant   Coronary artery disease involving native coronary artery of native heart without angina pectoris 03/01/2015   Overview:  2014 ST elevation MI  Formatting of this note might be different from the original. 2014 ST elevation MI   Degenerative spondylolisthesis 03/20/2015   Depression    Disorder of bone 10/27/2014   Disorder of sacroiliac joint 12/19/2015   Encounter for general adult medical examination without abnormal findings 08/15/2021   Essential (primary) hypertension 04/26/2010   Generalized anxiety disorder 01/06/2020   GI bleeding 05/16/2023   Hardening of the aorta (main artery of the heart) (HCC) 03/01/2015   Overview:  2014 ST elevation MI  Formatting of this note might be different from the original. 2014 ST elevation MI   Headache    last h/a  2001   Hyperglycemia 01/06/2020   Hyperlipidemia    Irregular heart beat    hx - no problems since taking acebutolol    Lumbar radiculopathy 08/27/2023   Melena 05/16/2023   Overweight 01/06/2020   Psoriasis    Recurrent major depression (HCC) 09/26/2021   Spinal stenosis of lumbar region 05/10/2010   Spondylolisthesis 10/27/2014     Assessment: Patient Reported Symptoms:  Cognitive Cognitive Status: Alert and oriented to person, place, and time, Insightful and able to interpret abstract concepts, Normal speech and language skills   Health Maintenance Behaviors: Annual physical exam Healing Pattern: Average Health Facilitated by: Rest, Stress management  Neurological Neurological Review of Symptoms: No symptoms reported    HEENT HEENT Symptoms Reported: No symptoms reported      Cardiovascular Cardiovascular Symptoms Reported: No symptoms reported    Respiratory Respiratory Symptoms Reported: No symptoms reported    Endocrine Endocrine Symptoms Reported: No symptoms reported    Gastrointestinal Gastrointestinal Symptoms Reported: No symptoms reported      Genitourinary Genitourinary Symptoms Reported: No symptoms reported    Integumentary      Musculoskeletal Musculoskelatal Symptoms Reviewed: Back pain Additional Musculoskeletal Details: chronic back pain-4 discs fused-bending is affected by it Musculoskeletal Management Strategies: Adequate rest, Medication therapy      Psychosocial Psychosocial Symptoms Reported: Anxiety - if selected complete GAD Additional Psychological Details: spouse has dementia, has been aggressive in the past, soical interactions limited Behavioral Management Strategies: Adequate rest, Coping strategies, Medication therapy Behavioral Health Comment: patient referred to [facility] for ongoing mental health support Major Change/Loss/Stressor/Fears (CP): Medical condition, family, Medical condition, self Techniques to Cope with Loss/Stress/Change: Diversional activities, Withdraw Quality of Family Relationships: supportive Do you feel physically threatened by others?: Yes (patient agreeable to contacting law enforcement in the event that her safety is in jeopardy)      04/05/2024   11:33 AM  Depression screen PHQ  2/9  Decreased Interest 0  Down,  Depressed, Hopeless 2  PHQ - 2 Score 2  Altered sleeping 0  Tired, decreased energy 1  Change in appetite 0  Feeling bad or failure about yourself  0  Trouble concentrating 0  Moving slowly or fidgety/restless 0  Suicidal thoughts 0  PHQ-9 Score 3    There were no vitals filed for this visit.  Medications Reviewed Today   Medications were not reviewed in this encounter     Recommendation:   PCP Follow-up Specialty provider follow-up as scheduled Referral to be sent to [facility] for ongoing mental health follow  Follow Up Plan:   Telephone follow-up 04/29/24  Lenn Mean, LCSW [place]  [facility], [facility] Licensed Clinical Social Worker  Direct Dial: [phone]

## 2024-04-07 NOTE — Patient Instructions (Signed)
 Visit Information  Thank you for taking time to visit with me today. Please don't hesitate to contact me if I can be of assistance to you before our next scheduled appointment.  Your next care management appointment is by telephone on 04/29/24 at 4pm Please call the care guide team at [phone] if you need to cancel, schedule, or reschedule an appointment.   Please call 911 if you are experiencing a Mental Health or Behavioral Health Crisis or need someone to talk to.  Carol Scheurich, LCSW   [facility], [facility] Licensed Clinical Social Worker  Direct Dial: [phone]

## 2024-04-29 ENCOUNTER — Other Ambulatory Visit: Payer: Self-pay | Admitting: *Deleted

## 2024-04-29 NOTE — Patient Outreach (Signed)
 Complex Care Management   Visit Note  04/29/2024  Name:  Carol Love MRN: [id number] DOB: Apr 20, 1951  Situation: Referral received for Complex Care Management related to Mental/Behavioral Health diagnosis Anxiety and caregiver stress. I obtained verbal consent from Patient.  Visit completed with patient  on the phone   Background:   Past Medical History:  Diagnosis Date   ABLA (acute blood loss anemia) 05/16/2023   Abnormal nuclear stress test 12/08/2018   Alcohol  use 05/16/2023   Anxiety    Arthritis    Arthropathy of lumbar facet joint 08/27/2023   CAD (coronary artery disease) 08/15/2021   Cervical spondylosis 02/07/2021   Chronic gastric ulcer with hemorrhage 05/16/2023   Chronic low back pain 07/15/2018   Constipation    uses stool softener with stimulant   Coronary artery disease involving native coronary artery of native heart without angina pectoris 03/01/2015   Overview:  2014 ST elevation MI  Formatting of this note might be different from the original. 2014 ST elevation MI   Degenerative spondylolisthesis 03/20/2015   Depression    Disorder of bone 10/27/2014   Disorder of sacroiliac joint 12/19/2015   Encounter for general adult medical examination without abnormal findings 08/15/2021   Essential (primary) hypertension 04/26/2010   Generalized anxiety disorder 01/06/2020   GI bleeding 05/16/2023   Hardening of the aorta (main artery of the heart) (HCC) 03/01/2015   Overview:  2014 ST elevation MI  Formatting of this note might be different from the original. 2014 ST elevation MI   Headache    last h/a  2001   Hyperglycemia 01/06/2020   Hyperlipidemia    Irregular heart beat    hx - no problems since taking acebutolol    Lumbar radiculopathy 08/27/2023   Melena 05/16/2023   Overweight 01/06/2020   Psoriasis    Recurrent major depression (HCC) 09/26/2021   Spinal stenosis of lumbar region 05/10/2010   Spondylolisthesis 10/27/2014    Assessment: Patient  Reported Symptoms:  Cognitive Cognitive Status: Alert and oriented to person, place, and time, Insightful and able to interpret abstract concepts, Normal speech and language skills Cognitive/Intellectual Conditions Management [RPT]: None reported or documented in medical history or problem list   Health Maintenance Behaviors: Annual physical exam Healing Pattern: Average Health Facilitated by: Rest, Stress management  Neurological Neurological Review of Symptoms: No symptoms reported    HEENT HEENT Symptoms Reported: No symptoms reported      Cardiovascular Cardiovascular Symptoms Reported: Chest pain or discomfort (may be due to anxiety) Does patient have uncontrolled Hypertension?: No Cardiovascular Management Strategies: Adequate rest, Coping strategies  Respiratory Respiratory Symptoms Reported: No symptoms reported    Endocrine Endocrine Symptoms Reported: No symptoms reported    Gastrointestinal Gastrointestinal Symptoms Reported: No symptoms reported      Genitourinary Genitourinary Symptoms Reported: No symptoms reported    Integumentary Integumentary Symptoms Reported: No symptoms reported    Musculoskeletal Musculoskelatal Symptoms Reviewed: Back pain Additional Musculoskeletal Details: continues to report chronic back pain Musculoskeletal Management Strategies: Adequate rest, Medication therapy      Psychosocial Psychosocial Symptoms Reported: Anxiety - if selected complete GAD Additional Psychological Details: increased anxiety related to caregiver responsibilites and lack of ongoing mental health support Behavioral Management Strategies: Adequate rest, Coping strategies, Medication therapy Behavioral Health Comment: patient to be referred to [facility] for ongoing  mental healh support Major Change/Loss/Stressor/Fears (CP): Medical condition, self, Medical condition, family Behaviors When Feeling Stressed/Fearful: deep breathing excercises Techniques  to Cope with Loss/Stress/Change: Diversional activities,  Withdraw Quality of Family Relationships: supportive Do you feel physically threatened by others?: No (spouse has made gestures, pt denies current physcial harm-agrees to contact law enforcement if her safety is in jeopardy)      04/05/2024   11:33 AM  Depression screen PHQ 2/9  Decreased Interest 0  Down, Depressed, Hopeless 2  PHQ - 2 Score 2  Altered sleeping 0  Tired, decreased energy 1  Change in appetite 0  Feeling bad or failure about yourself  0  Trouble concentrating 0  Moving slowly or fidgety/restless 0  Suicidal thoughts 0  PHQ-9 Score 3    There were no vitals filed for this visit.  Medications Reviewed Today   Medications were not reviewed in this encounter     Recommendation:   PCP Follow-up CSW to refer patient for ongoing mental health support  Follow Up Plan:   Telephone follow up appointment date/time:  04/30/24 9am  Tashawnda Bleiler, LCSW [place]  [facility], [facility] Licensed Clinical Social Worker  Direct Dial: [phone]

## 2024-04-29 NOTE — Patient Instructions (Signed)
 Visit Information  Thank you for taking time to visit with me today. Please don't hesitate to contact me if I can be of assistance to you before our next scheduled appointment.  Your next care management appointment is by telephone on 04/30/24 at 9am    Please call the care guide team at [phone] if you need to cancel, schedule, or reschedule an appointment.   Please call 911 if you are experiencing a Mental Health or Behavioral Health Crisis or need someone to talk to.  Corazon Nickolas, LCSW [place]  [facility], [facility] Licensed Clinical Social Worker  Direct Dial: [phone]

## 2024-04-30 ENCOUNTER — Encounter: Payer: Self-pay | Admitting: *Deleted

## 2024-04-30 NOTE — Patient Outreach (Signed)
 Phone call to [facility]. Left message for provider's CMA Katheryn requesting that a referral for Psychiatric care-[facility] be faxed to [phone] for medication management.    Kajah Santizo, LCSW [place]  [facility], [facility] Licensed Clinical Social Worker  Direct Dial: [phone]

## 2024-05-14 DIAGNOSIS — I2510 Atherosclerotic heart disease of native coronary artery without angina pectoris: Secondary | ICD-10-CM | POA: Diagnosis not present

## 2024-05-14 DIAGNOSIS — I10 Essential (primary) hypertension: Secondary | ICD-10-CM | POA: Diagnosis not present

## 2024-05-14 DIAGNOSIS — J439 Emphysema, unspecified: Secondary | ICD-10-CM | POA: Diagnosis not present

## 2024-05-14 DIAGNOSIS — R739 Hyperglycemia, unspecified: Secondary | ICD-10-CM | POA: Diagnosis not present

## 2024-05-25 ENCOUNTER — Ambulatory Visit: Attending: Cardiology | Admitting: Cardiology

## 2024-06-04 ENCOUNTER — Other Ambulatory Visit: Admitting: *Deleted

## 2024-06-04 NOTE — Patient Instructions (Signed)
 Visit Information  Thank you for taking time to visit with me today. Please don't hesitate to contact me if I can be of assistance to you before our next scheduled appointment.  Your next care management appointment is by telephone on 06/07/24 at 12pm    Please call the care guide team at [phone] if you need to cancel, schedule, or reschedule an appointment.   Please call the Suicide and Crisis Lifeline: 988 call the [place]  National Suicide Prevention Lifeline: 5736147605 or TTY: 623 789 0098 TTY 475-010-8970) to talk to a trained counselor call 1-800-273-TALK (toll free, 24 hour hotline) if you are experiencing a Mental Health or Behavioral Health Crisis or need someone to talk to.  Jood Retana, LCSW [place]  [facility], [facility] Licensed Clinical Social Worker  Direct Dial: [phone]

## 2024-06-04 NOTE — Patient Outreach (Signed)
 Complex Care Management   Visit Note  06/04/2024  Name:  Carol Love MRN: [id number] DOB: [birth year]  Situation: Referral received for Complex Care Management related to Mental/Behavioral Health diagnosis Anxiety I obtained verbal consent from Patient.  Visit completed with Patient  on the phone  Background:   Past Medical History:  Diagnosis Date   ABLA (acute blood loss anemia) 05/16/2023   Abnormal nuclear stress test 12/08/2018   Alcohol  use 05/16/2023   Anxiety    Arthritis    Arthropathy of lumbar facet joint 08/27/2023   CAD (coronary artery disease) 08/15/2021   Cervical spondylosis 02/07/2021   Chronic gastric ulcer with hemorrhage 05/16/2023   Chronic low back pain 07/15/2018   Constipation    uses stool softener with stimulant   Coronary artery disease involving native coronary artery of native heart without angina pectoris 03/01/2015   Overview:  2014 ST elevation MI  Formatting of this note might be different from the original. 2014 ST elevation MI   Degenerative spondylolisthesis 03/20/2015   Depression    Disorder of bone 10/27/2014   Disorder of sacroiliac joint 12/19/2015   Encounter for general adult medical examination without abnormal findings 08/15/2021   Essential hypertension 08/28/2023   Generalized anxiety disorder 01/06/2020   GI bleeding 05/16/2023   Hardening of the aorta (main artery of the heart) (HCC) 03/01/2015   Overview:  2014 ST elevation MI  Formatting of this note might be different from the original. 2014 ST elevation MI   Headache    last h/a  2001   Hyperglycemia 01/06/2020   Hyperlipidemia    Irregular heart beat    hx - no problems since taking acebutolol    Lumbar radiculopathy 08/27/2023   Melena 05/16/2023   Overweight 01/06/2020   Psoriasis    Recurrent major depression (HCC) 09/26/2021   Spinal stenosis of lumbar region 05/10/2010   Spondylolisthesis 10/27/2014    Assessment: Patient Reported Symptoms:  Cognitive  Cognitive Status: Alert and oriented to person, place, and time, Insightful and able to interpret abstract concepts, Normal speech and language skills Cognitive/Intellectual Conditions Management [RPT]: None reported or documented in medical history or problem list   Health Maintenance Behaviors: Annual physical exam Healing Pattern: Average Health Facilitated by: Rest, Stress management  Neurological Neurological Review of Symptoms: No symptoms reported    HEENT HEENT Symptoms Reported: No symptoms reported      Cardiovascular Cardiovascular Symptoms Reported: Chest pain or discomfort (due to anxiety related to caregiving) Cardiovascular Management Strategies: Adequate rest, Coping strategies  Respiratory Respiratory Symptoms Reported: No symptoms reported    Endocrine Endocrine Symptoms Reported: No symptoms reported    Gastrointestinal Gastrointestinal Symptoms Reported: No symptoms reported      Genitourinary Genitourinary Symptoms Reported: No symptoms reported    Integumentary Integumentary Symptoms Reported: No symptoms reported    Musculoskeletal Musculoskelatal Symptoms Reviewed: Back pain        Psychosocial Psychosocial Symptoms Reported: Anxiety - if selected complete GAD          06/04/2024    PHQ2-9 Depression Screening   Little interest or pleasure in doing things    Feeling down, depressed, or hopeless    PHQ-2 - Total Score    Trouble falling or staying asleep, or sleeping too much    Feeling tired or having little energy    Poor appetite or overeating     Feeling bad about yourself - or that you are a failure or have let yourself or your family  down    Trouble concentrating on things, such as reading the newspaper or watching television    Moving or speaking so slowly that other people could have noticed.  Or the opposite - being so fidgety or restless that you have been moving around a lot more than usual    Thoughts that you would be better off dead, or  hurting yourself in some way    PHQ2-9 Total Score    If you checked off any problems, how difficult have these problems made it for you to do your work, take care of things at home, or get along with other people    Depression Interventions/Treatment      There were no vitals filed for this visit.  Medications Reviewed Today     Reviewed by Ermalinda Lenn HERO, LCSW (Social Worker) on 06/04/24 at 1701  Med List Status: <None>   Medication Order Taking? Sig Documenting Provider Last Dose Status Informant  ACCRUFER 30 MG CAPS [id number]  Take 1 capsule by mouth 2 (two) times daily.  Patient not taking: Reported on 04/05/2024   [provider]  Active   acebutolol  (SECTRAL ) 200 MG capsule [id number]  Take 1 capsule (200 mg total) by mouth daily. Revankar, Jennifer SAUNDERS, MD  Active Self, Pharmacy Records  ALPRAZolam  (XANAX ) 0.5 MG tablet [id number]  Take 0.5 mg by mouth 2 (two) times daily. [provider]  Active Self, Pharmacy Records           Med Note CHARMAYNE, [facility] N   Tue May 27, 2017 11:07 AM)    amitriptyline  (ELAVIL ) 50 MG tablet [id number]  Take 50 mg by mouth at bedtime. [provider]  Active Self, Pharmacy Records           Med Note CHARMAYNE, [facility] N   Tue May 27, 2017 11:07 AM)    amLODipine  (NORVASC ) 5 MG tablet [id number]  Take 1 tablet (5 mg total) by mouth daily. Elgergawy, Brayton RAMAN, MD  Active   ARIPiprazole  (ABILIFY ) 5 MG tablet [id number]  Take 5 mg by mouth at bedtime. [provider]  Active Self, Pharmacy Records  atorvastatin  (LIPITOR) 40 MG tablet [id number]  TAKE 1 TABLET BY MOUTH EVERY EVENING Revankar, Jennifer SAUNDERS, MD  Active   busPIRone  (BUSPAR ) 15 MG tablet [id number]  Take 15 mg by mouth 2 (two) times daily. [provider]  Active Self, Pharmacy Records  Calcium  Citrate-Vitamin D  (CALCIUM  + D PO) [id number]  Take 1 tablet by mouth daily. [provider]  Active Self, Pharmacy Records  DULoxetine  (CYMBALTA ) 30 MG capsule [id number]   Take 30 mg by mouth daily. Take with [provider]  Active Self, Pharmacy Records           Med Note (GARNER, TIFFANY L   Thu Aug 28, 2023  2:17 PM)    DULoxetine  (CYMBALTA ) 60 MG capsule [id number]  Take 60 mg by mouth daily.  Patient not taking: Reported on 04/05/2024   [provider]  Active Self, Pharmacy Records  gabapentin  (NEURONTIN ) 300 MG capsule [id number]  Take 300 mg by mouth 3 (three) times daily. [provider]  Active   metoprolol  tartrate (LOPRESSOR ) 100 MG tablet [id number]  Take 1 tablet (100 mg total) by mouth once for 1 dose. Take 2 hours prior to your CT if your heart rate is greater than 55 Revankar, Jennifer SAUNDERS, MD  Expired 09/25/23 2359   pantoprazole  (PROTONIX ) 20 MG tablet [id number]  Take 1 tablet (20 mg  total) by mouth daily.  Patient not taking: Reported on 04/05/2024   Nandigam, Kavitha V, MD  Active   pantoprazole  (PROTONIX ) 40 MG tablet [id number]  Take 1 tablet (40 mg total) by mouth 2 (two) times daily. Elgergawy, Brayton RAMAN, MD  Expired 04/05/24 2359   propranolol (INDERAL) 10 MG tablet [id number]  Take 10 mg by mouth as needed (anxiety). [provider]  Active   traMADol  (ULTRAM ) 50 MG tablet [id number]  Take 50 mg by mouth as needed for moderate pain (pain score 4-6) or severe pain (pain score 7-10). [provider]  Active             Recommendation:   PCP Follow-up [facility] on 06/10/24-appointment will be virtual  Follow Up Plan:   Telephone follow-up 06/07/24  Lenn Mean, LCSW [place]  [facility], [facility] Licensed Clinical Social Worker  Direct Dial: [phone]

## 2024-06-07 ENCOUNTER — Other Ambulatory Visit: Payer: Self-pay | Admitting: *Deleted

## 2024-06-08 NOTE — Patient Instructions (Signed)
 Visit Information  Thank you for taking time to visit with me today. Please don't hesitate to contact me if I can be of assistance to you before our next scheduled appointment.  Your next care management appointment is by telephone on 06/23/24 at 11:30am  Please call the care guide team at [phone] if you need to cancel, schedule, or reschedule an appointment.   Please call the Suicide and Crisis Lifeline: 988 call the [place]  National Suicide Prevention Lifeline: 814-285-4777 or TTY: 201-880-3215 TTY (830)379-1364) to talk to a trained counselor call 1-800-273-TALK (toll free, 24 hour hotline) if you are experiencing a Mental Health or Behavioral Health Crisis or need someone to talk to.  Carol Motter, LCSW [place]  [facility], [facility] Licensed Clinical Social Worker  Direct Dial: [phone]

## 2024-06-08 NOTE — Patient Outreach (Signed)
 Complex Care Management   Visit Note  06/08/2024  Name:  Carol Love MRN: [id number] DOB: June 23, 1951  Situation: Referral received for Complex Care Management related to Mental/Behavioral Health diagnosis Anxiety and caregiver stress. I obtained verbal consent from Patient. Visit completed with patient on the phone   Background:   Past Medical History:  Diagnosis Date   ABLA (acute blood loss anemia) 05/16/2023   Abnormal nuclear stress test 12/08/2018   Alcohol  use 05/16/2023   Anxiety    Arthritis    Arthropathy of lumbar facet joint 08/27/2023   CAD (coronary artery disease) 08/15/2021   Cervical spondylosis 02/07/2021   Chronic gastric ulcer with hemorrhage 05/16/2023   Chronic low back pain 07/15/2018   Constipation    uses stool softener with stimulant   Coronary artery disease involving native coronary artery of native heart without angina pectoris 03/01/2015   Overview:  2014 ST elevation MI  Formatting of this note might be different from the original. 2014 ST elevation MI   Degenerative spondylolisthesis 03/20/2015   Depression    Disorder of bone 10/27/2014   Disorder of sacroiliac joint 12/19/2015   Encounter for general adult medical examination without abnormal findings 08/15/2021   Essential hypertension 08/28/2023   Generalized anxiety disorder 01/06/2020   GI bleeding 05/16/2023   Hardening of the aorta (main artery of the heart) (HCC) 03/01/2015   Overview:  2014 ST elevation MI  Formatting of this note might be different from the original. 2014 ST elevation MI   Headache    last h/a  2001   Hyperglycemia 01/06/2020   Hyperlipidemia    Irregular heart beat    hx - no problems since taking acebutolol    Lumbar radiculopathy 08/27/2023   Melena 05/16/2023   Overweight 01/06/2020   Psoriasis    Recurrent major depression (HCC) 09/26/2021   Spinal stenosis of lumbar region 05/10/2010   Spondylolisthesis 10/27/2014    Assessment: Patient Reported  Symptoms:  Cognitive Cognitive Status: Alert and oriented to person, place, and time, Insightful and able to interpret abstract concepts, Normal speech and language skills Cognitive/Intellectual Conditions Management [RPT]: None reported or documented in medical history or problem list   Health Maintenance Behaviors: Annual physical exam Healing Pattern: Average Health Facilitated by: Rest, Stress management  Neurological      HEENT HEENT Symptoms Reported: No symptoms reported      Cardiovascular Cardiovascular Symptoms Reported: Chest pain or discomfort Cardiovascular Management Strategies: Adequate rest, Coping strategies  Respiratory Respiratory Symptoms Reported: No symptoms reported    Endocrine      Gastrointestinal Gastrointestinal Symptoms Reported: No symptoms reported      Genitourinary Genitourinary Symptoms Reported: No symptoms reported    Integumentary Integumentary Symptoms Reported: No symptoms reported    Musculoskeletal Musculoskelatal Symptoms Reviewed: Back pain Additional Musculoskeletal Details: continues to report ongoing chronic back pain Musculoskeletal Management Strategies: Adequate rest, Medication therapy      Psychosocial Psychosocial Symptoms Reported: Anxiety - if selected complete GAD Additional Psychological Details: continued anxiety and stress related to caregiver responsibilities and lack of mental health support-patient dismissed from mental health practice due to missed appointments-medication managed by primary care but will need ongoing mental health support Behavioral Management Strategies: Adequate rest, Coping strategies, Medication therapy Behavioral Health Comment: Patient referred to [facility] for ongoing mental health support-virtual appointment scheduled for 06/10/24-CSW assisted patient with required intake paperwork Major Change/Loss/Stressor/Fears (CP): Medical condition, self, Medical condition, family Behaviors  When Feeling Stressed/Fearful: deep breathing excercises, prioritzing  tasks and requests of spouse Techniques to Cope with Loss/Stress/Change: Diversional activities, Withdraw Quality of Family Relationships: supportive Do you feel physically threatened by others?: No    06/08/2024    PHQ2-9 Depression Screening   Little interest or pleasure in doing things    Feeling down, depressed, or hopeless    PHQ-2 - Total Score    Trouble falling or staying asleep, or sleeping too much    Feeling tired or having little energy    Poor appetite or overeating     Feeling bad about yourself - or that you are a failure or have let yourself or your family down    Trouble concentrating on things, such as reading the newspaper or watching television    Moving or speaking so slowly that other people could have noticed.  Or the opposite - being so fidgety or restless that you have been moving around a lot more than usual    Thoughts that you would be better off dead, or hurting yourself in some way    PHQ2-9 Total Score    If you checked off any problems, how difficult have these problems made it for you to do your work, take care of things at home, or get along with other people    Depression Interventions/Treatment      There were no vitals filed for this visit.  Medications Reviewed Today     Reviewed by Ermalinda Lenn HERO, LCSW (Social Worker) on 06/07/24 at 1812  Med List Status: <None>   Medication Order Taking? Sig Documenting Provider Last Dose Status Informant  ACCRUFER 30 MG CAPS [id number]  Take 1 capsule by mouth 2 (two) times daily.  Patient not taking: Reported on 04/05/2024   [provider]  Active   acebutolol  (SECTRAL ) 200 MG capsule [id number]  Take 1 capsule (200 mg total) by mouth daily. Revankar, Jennifer SAUNDERS, MD  Active Self, Pharmacy Records  ALPRAZolam  (XANAX ) 0.5 MG tablet [id number]  Take 0.5 mg by mouth 2 (two) times daily. [provider]  Active Self, Pharmacy  Records           Med Note CHARMAYNE, [facility]   Tue May 27, 2017 11:07 AM)    amitriptyline  (ELAVIL ) 50 MG tablet [id number]  Take 50 mg by mouth at bedtime. [provider]  Active Self, Pharmacy Records           Med Note CHARMAYNE, [facility] N   Tue May 27, 2017 11:07 AM)    amLODipine  (NORVASC ) 5 MG tablet [id number]  Take 1 tablet (5 mg total) by mouth daily. Elgergawy, Brayton RAMAN, MD  Active   ARIPiprazole  (ABILIFY ) 5 MG tablet [id number]  Take 5 mg by mouth at bedtime. [provider]  Active Self, Pharmacy Records  atorvastatin  (LIPITOR) 40 MG tablet [id number]  TAKE 1 TABLET BY MOUTH EVERY EVENING Revankar, Jennifer SAUNDERS, MD  Active   busPIRone  (BUSPAR ) 15 MG tablet [id number]  Take 15 mg by mouth 2 (two) times daily. [provider]  Active Self, Pharmacy Records  Calcium  Citrate-Vitamin D  (CALCIUM  + D PO) [id number]  Take 1 tablet by mouth daily. [provider]  Active Self, Pharmacy Records  DULoxetine  (CYMBALTA ) 30 MG capsule [id number]  Take 30 mg by mouth daily. Take with [provider]  Active Self, Pharmacy Records           Med Note (GARNER, [facility] L   Thu Aug 28, 2023  2:17 PM)    DULoxetine  (CYMBALTA )  60 MG capsule [id number]  Take 60 mg by mouth daily.  Patient not taking: Reported on 04/05/2024   [provider]  Active Self, Pharmacy Records  gabapentin  (NEURONTIN ) 300 MG capsule [id number]  Take 300 mg by mouth 3 (three) times daily. [provider]  Active   metoprolol  tartrate (LOPRESSOR ) 100 MG tablet [id number]  Take 1 tablet (100 mg total) by mouth once for 1 dose. Take 2 hours prior to your CT if your heart rate is greater than 55 Revankar, Jennifer SAUNDERS, MD  Expired 09/25/23 2359   pantoprazole  (PROTONIX ) 20 MG tablet [id number]  Take 1 tablet (20 mg total) by mouth daily.  Patient not taking: Reported on 04/05/2024   Nandigam, Kavitha V, MD  Active   pantoprazole  (PROTONIX ) 40 MG tablet [id number]  Take 1 tablet (40 mg total) by mouth 2  (two) times daily. Elgergawy, Brayton RAMAN, MD  Expired 04/05/24 2359   propranolol (INDERAL) 10 MG tablet [id number]  Take 10 mg by mouth as needed (anxiety). [provider]  Active   traMADol  (ULTRAM ) 50 MG tablet [id number]  Take 50 mg by mouth as needed for moderate pain (pain score 4-6) or severe pain (pain score 7-10). [provider]  Active             Recommendation:   PCP Follow-up [facility] 06/10/24 1:30pm  Follow Up Plan:   Telephone follow up appointment date/time:  06/23/24 11:30am  Maybree Riling Ermalinda HUGHS [place]  [facility], [facility] Licensed Clinical Social Worker  Direct Dial: [phone]

## 2024-06-14 ENCOUNTER — Telehealth: Payer: Self-pay | Admitting: *Deleted

## 2024-06-14 NOTE — Patient Outreach (Signed)
 Phone call to patient to confirm follow up with [facility] . Patient confirmed completion of initial assessment on 06/10/24. Patient further confirms follow up scheduled for 10:30am. CSW to follow up with patient for scheduled appointment on 06/23/24 at 11:30am,   Lenn Mean, LCSW [place]  [facility], [facility] Licensed Clinical Social Worker  Direct Dial: [phone]

## 2024-06-23 ENCOUNTER — Other Ambulatory Visit: Payer: Self-pay | Admitting: *Deleted

## 2024-06-23 NOTE — Patient Outreach (Signed)
 Phone call from patient stating that her spouse has transitioned to residential hospice care. This social worker to reschedule follow up appointment for 07/14/24 at 11am.   Lenn Mean, LCSW [place]  [facility], [facility] Licensed Clinical Social Worker  Direct Dial: [phone]

## 2024-07-07 DIAGNOSIS — F331 Major depressive disorder, recurrent, moderate: Secondary | ICD-10-CM | POA: Diagnosis not present

## 2024-07-07 DIAGNOSIS — R413 Other amnesia: Secondary | ICD-10-CM | POA: Diagnosis not present

## 2024-07-07 DIAGNOSIS — Z23 Encounter for immunization: Secondary | ICD-10-CM | POA: Diagnosis not present

## 2024-07-12 DIAGNOSIS — F410 Panic disorder [episodic paroxysmal anxiety] without agoraphobia: Secondary | ICD-10-CM | POA: Diagnosis not present

## 2024-07-12 DIAGNOSIS — F411 Generalized anxiety disorder: Secondary | ICD-10-CM | POA: Diagnosis not present

## 2024-07-12 DIAGNOSIS — F331 Major depressive disorder, recurrent, moderate: Secondary | ICD-10-CM | POA: Diagnosis not present

## 2024-07-14 ENCOUNTER — Other Ambulatory Visit: Payer: Self-pay | Admitting: *Deleted

## 2024-07-14 NOTE — Patient Instructions (Signed)
 Visit Information  Thank you for taking time to visit with me today. Please don't hesitate to contact me if I can be of assistance to you before our next scheduled appointment.  Your next care management appointment is no further scheduled appointments.    Patient has met all care management goals. Care Management case will be closed. Patient has been provided contact information should new needs arise.   Please call the care guide team at [phone] if you need to cancel, schedule, or reschedule an appointment.   Please call the Suicide and Crisis Lifeline: 988 call the [place]  National Suicide Prevention Lifeline: 317-830-3116 or TTY: 419-536-0878 TTY 205-633-9491) to talk to a trained counselor call 1-800-273-TALK (toll free, 24 hour hotline) call 911 if you are experiencing a Mental Health or Behavioral Health Crisis or need someone to talk to.  Kenzley Ke, LCSW [place]  [facility], [facility] Licensed Clinical Social Worker  Direct Dial: [phone]

## 2024-07-14 NOTE — Patient Outreach (Signed)
 Complex Care Management   Visit Note  07/14/2024  Name:  Carol Love MRN: [id number] DOB: 1950-09-27  Situation: Referral received for Complex Care Management related to caregiver stress I obtained verbal consent from Patient.  Visit completed with Patient  on the phone  Background:   Past Medical History:  Diagnosis Date   ABLA (acute blood loss anemia) 05/16/2023   Abnormal nuclear stress test 12/08/2018   Alcohol  use 05/16/2023   Anxiety    Arthritis    Arthropathy of lumbar facet joint 08/27/2023   CAD (coronary artery disease) 08/15/2021   Cervical spondylosis 02/07/2021   Chronic gastric ulcer with hemorrhage 05/16/2023   Chronic low back pain 07/15/2018   Constipation    uses stool softener with stimulant   Coronary artery disease involving native coronary artery of native heart without angina pectoris 03/01/2015   Overview:  2014 ST elevation MI  Formatting of this note might be different from the original. 2014 ST elevation MI   Degenerative spondylolisthesis 03/20/2015   Depression    Disorder of bone 10/27/2014   Disorder of sacroiliac joint 12/19/2015   Encounter for general adult medical examination without abnormal findings 08/15/2021   Essential hypertension 08/28/2023   Generalized anxiety disorder 01/06/2020   GI bleeding 05/16/2023   Hardening of the aorta (main artery of the heart) 03/01/2015   Overview:  2014 ST elevation MI  Formatting of this note might be different from the original. 2014 ST elevation MI   Headache    last h/a  2001   Hyperglycemia 01/06/2020   Hyperlipidemia    Irregular heart beat    hx - no problems since taking acebutolol    Lumbar radiculopathy 08/27/2023   Melena 05/16/2023   Overweight 01/06/2020   Psoriasis    Recurrent major depression 09/26/2021   Spinal stenosis of lumbar region 05/10/2010   Spondylolisthesis 10/27/2014    Assessment: Patient Reported Symptoms:  Cognitive Cognitive Status: Alert and oriented  to person, place, and time, Insightful and able to interpret abstract concepts, Normal speech and language skills Cognitive/Intellectual Conditions Management [RPT]: None reported or documented in medical history or problem list   Health Maintenance Behaviors: Annual physical exam Healing Pattern: Average Health Facilitated by: Stress management, Rest  Neurological Neurological Review of Symptoms: No symptoms reported    HEENT HEENT Symptoms Reported: No symptoms reported      Cardiovascular Cardiovascular Symptoms Reported: No symptoms reported (Medication for anxiety has taken care of that)    Respiratory Respiratory Symptoms Reported: No symptoms reported    Endocrine Endocrine Symptoms Reported: No symptoms reported Is patient diabetic?: No    Gastrointestinal Gastrointestinal Symptoms Reported: No symptoms reported      Genitourinary Genitourinary Symptoms Reported: No symptoms reported    Integumentary Integumentary Symptoms Reported: No symptoms reported    Musculoskeletal Musculoskelatal Symptoms Reviewed: Back pain Additional Musculoskeletal Details: Has chronic back pain-always has mild back pain Musculoskeletal Management Strategies: Adequate rest, Medication therapy      Psychosocial Psychosocial Symptoms Reported: Anxiety - if selected complete GAD, Depression - if selected complete PHQ 2-9 Additional Psychological Details: sadness related to the recent of her spouse. confirms strong support from family and church friends-now active with [employer]-next appointment 08/21/24. Contact number for Grief Counseling through  [facility] [phone] Behavioral Management Strategies: Adequate rest, Counseling, Medication therapy Major Change/Loss/Stressor/Fears (CP): Death of a loved one Behaviors When Feeling Stressed/Fearful: spirtual practicies, prioritizing tasks, accepting support from family and friends, taking care of pets Techniques  to Cope with  Loss/Stress/Change: Diversional activities, Medication, Spiritual practice(s), Support group Quality of Family Relationships: supportive Do you feel physically threatened by others?: No    07/14/2024    PHQ2-9 Depression Screening   Little interest or pleasure in doing things Nearly every day  Feeling down, depressed, or hopeless Nearly every day  PHQ-2 - Total Score 6  Trouble falling or staying asleep, or sleeping too much Not at all  Feeling tired or having little energy Not at all  Poor appetite or overeating  Not at all  Feeling bad about yourself - or that you are a failure or have let yourself or your family down Not at all  Trouble concentrating on things, such as reading the newspaper or watching television Not at all  Moving or speaking so slowly that other people could have noticed.  Or the opposite - being so fidgety or restless that you have been moving around a lot more than usual Not at all  Thoughts that you would be better off dead, or hurting yourself in some way Not at all  PHQ2-9 Total Score 6  If you checked off any problems, how difficult have these problems made it for you to do your work, take care of things at home, or get along with other people    Depression Interventions/Treatment      There were no vitals filed for this visit.  Medications Reviewed Today     Reviewed by Ermalinda Lenn HERO, LCSW (Social Worker) on 07/14/24 at 1201  Med List Status: <None>   Medication Order Taking? Sig Documenting Provider Last Dose Status Informant  ACCRUFER 30 MG CAPS [id number]  Take 1 capsule by mouth 2 (two) times daily.  Patient not taking: Reported on 07/14/2024   [provider]  Active   acebutolol  (SECTRAL ) 200 MG capsule [id number] Yes Take 1 capsule (200 mg total) by mouth daily. Revankar, Jennifer SAUNDERS, MD  Active Self, Pharmacy Records  ALPRAZolam  (XANAX ) 0.5 MG tablet [id number] Yes Take 0.5 mg by mouth 2 (two) times daily. [provider]  Active  Self, Pharmacy Records           Med Note CHARMAYNE, [facility] N   Tue May 27, 2017 11:07 AM)    amitriptyline  (ELAVIL ) 50 MG tablet [id number] Yes Take 50 mg by mouth at bedtime. [provider]  Active Self, Pharmacy Records           Med Note CHARMAYNE, [facility] N   Tue May 27, 2017 11:07 AM)    amLODipine  (NORVASC ) 5 MG tablet [id number] Yes Take 1 tablet (5 mg total) by mouth daily. Elgergawy, Brayton RAMAN, MD  Active   ARIPiprazole  (ABILIFY ) 5 MG tablet [id number] Yes Take 5 mg by mouth at bedtime. [provider]  Active Self, Pharmacy Records  atorvastatin  (LIPITOR) 40 MG tablet [id number] Yes TAKE 1 TABLET BY MOUTH EVERY EVENING Revankar, Jennifer SAUNDERS, MD  Active   busPIRone  (BUSPAR ) 15 MG tablet [id number] Yes Take 15 mg by mouth 2 (two) times daily. [provider]  Active Self, Pharmacy Records  Calcium  Citrate-Vitamin D  (CALCIUM  + D PO) [id number] Yes Take 1 tablet by mouth daily. [provider]  Active Self, Pharmacy Records  DULoxetine  (CYMBALTA ) 30 MG capsule [id number]  Take 30 mg by mouth daily. Take with  Patient not taking: Reported on 07/14/2024   [provider]  Active Self, Pharmacy Records           Med Note (GARNER, [facility]  L   Thu Aug 28, 2023  2:17 PM)    DULoxetine  (CYMBALTA ) 60 MG capsule [id number] Yes Take 60 mg by mouth daily. [provider]  Active Self, Pharmacy Records  gabapentin  (NEURONTIN ) 300 MG capsule [id number] Yes Take 300 mg by mouth 3 (three) times daily. [provider]  Active   metoprolol  tartrate (LOPRESSOR ) 100 MG tablet [id number] Yes Take 1 tablet (100 mg total) by mouth once for 1 dose. Take 2 hours prior to your CT if your heart rate is greater than 55 Revankar, Jennifer SAUNDERS, MD  Active   pantoprazole  (PROTONIX ) 20 MG tablet [id number] Yes Take 1 tablet (20 mg total) by mouth daily. Nandigam, Kavitha V, MD  Active   pantoprazole  (PROTONIX ) 40 MG tablet [id number]  Take 1 tablet (40 mg total) by mouth 2 (two) times daily.   Patient not taking: Reported on 07/14/2024   Elgergawy, Brayton RAMAN, MD  Expired 04/05/24 2359   propranolol (INDERAL) 10 MG tablet [id number] Yes Take 10 mg by mouth as needed (anxiety). [provider]  Active   traMADol  (ULTRAM ) 50 MG tablet [id number] Yes Take 50 mg by mouth as needed for moderate pain (pain score 4-6) or severe pain (pain score 7-10). [provider]  Active             Recommendation:   PCP Follow-up [facility] 07/21/24  Follow Up Plan:   Closing From:  Complex Care Management  Nehemiah Mcfarren, LCSW [facility], [facility] Licensed Clinical Social Worker  Direct Dial: [phone]

## 2024-08-04 DIAGNOSIS — F330 Major depressive disorder, recurrent, mild: Secondary | ICD-10-CM | POA: Diagnosis not present

## 2024-08-04 DIAGNOSIS — F410 Panic disorder [episodic paroxysmal anxiety] without agoraphobia: Secondary | ICD-10-CM | POA: Diagnosis not present

## 2024-08-04 DIAGNOSIS — F411 Generalized anxiety disorder: Secondary | ICD-10-CM | POA: Diagnosis not present

## 2024-09-01 DIAGNOSIS — F411 Generalized anxiety disorder: Secondary | ICD-10-CM | POA: Diagnosis not present

## 2024-09-01 DIAGNOSIS — F330 Major depressive disorder, recurrent, mild: Secondary | ICD-10-CM | POA: Diagnosis not present

## 2024-09-01 DIAGNOSIS — F410 Panic disorder [episodic paroxysmal anxiety] without agoraphobia: Secondary | ICD-10-CM | POA: Diagnosis not present

## 2024-09-06 ENCOUNTER — Other Ambulatory Visit: Payer: Self-pay | Admitting: Cardiology

## 2024-11-25 ENCOUNTER — Ambulatory Visit: Admitting: Neurology
